# Patient Record
Sex: Male | Born: 1935 | Race: Black or African American | Hispanic: No | Marital: Single | Smoking: Never smoker
Health system: Southern US, Community
[De-identification: ages and names within clinical notes are randomized; demographics above are authoritative.]

## PROBLEM LIST (undated history)

## (undated) DIAGNOSIS — I4892 Unspecified atrial flutter: Secondary | ICD-10-CM

## (undated) DIAGNOSIS — E039 Hypothyroidism, unspecified: Secondary | ICD-10-CM

## (undated) DIAGNOSIS — J90 Pleural effusion, not elsewhere classified: Secondary | ICD-10-CM

## (undated) DIAGNOSIS — H409 Unspecified glaucoma: Secondary | ICD-10-CM

## (undated) DIAGNOSIS — I428 Other cardiomyopathies: Secondary | ICD-10-CM

## (undated) DIAGNOSIS — I10 Essential (primary) hypertension: Secondary | ICD-10-CM

## (undated) DIAGNOSIS — N184 Chronic kidney disease, stage 4 (severe): Secondary | ICD-10-CM

## (undated) DIAGNOSIS — I5020 Unspecified systolic (congestive) heart failure: Secondary | ICD-10-CM

## (undated) DIAGNOSIS — M109 Gout, unspecified: Secondary | ICD-10-CM

## (undated) DIAGNOSIS — K219 Gastro-esophageal reflux disease without esophagitis: Secondary | ICD-10-CM

## (undated) DIAGNOSIS — F329 Major depressive disorder, single episode, unspecified: Secondary | ICD-10-CM

## (undated) DIAGNOSIS — I313 Pericardial effusion (noninflammatory): Secondary | ICD-10-CM

## (undated) DIAGNOSIS — E559 Vitamin D deficiency, unspecified: Secondary | ICD-10-CM

## (undated) DIAGNOSIS — I3139 Other pericardial effusion (noninflammatory): Secondary | ICD-10-CM

## (undated) DIAGNOSIS — D649 Anemia, unspecified: Secondary | ICD-10-CM

## (undated) DIAGNOSIS — F32A Depression, unspecified: Secondary | ICD-10-CM

## (undated) HISTORY — DX: Other cardiomyopathies: I42.8

## (undated) HISTORY — DX: Depression, unspecified: F32.A

## (undated) HISTORY — DX: Gout, unspecified: M10.9

## (undated) HISTORY — DX: Unspecified systolic (congestive) heart failure: I50.20

## (undated) HISTORY — DX: Gastro-esophageal reflux disease without esophagitis: K21.9

## (undated) HISTORY — PX: KNEE SURGERY: SHX244

## (undated) HISTORY — DX: Chronic kidney disease, stage 4 (severe): N18.4

## (undated) HISTORY — DX: Major depressive disorder, single episode, unspecified: F32.9

## (undated) HISTORY — PX: INSERT / REPLACE / REMOVE PACEMAKER: SUR710

## (undated) HISTORY — DX: Pleural effusion, not elsewhere classified: J90

## (undated) HISTORY — DX: Other pericardial effusion (noninflammatory): I31.39

## (undated) HISTORY — DX: Pericardial effusion (noninflammatory): I31.3

---

## 2017-10-31 ENCOUNTER — Ambulatory Visit: Payer: Self-pay

## 2017-11-11 ENCOUNTER — Other Ambulatory Visit: Payer: Self-pay

## 2017-11-11 ENCOUNTER — Ambulatory Visit: Payer: Medicare Other | Admitting: Urology

## 2017-11-11 NOTE — Progress Notes (Deleted)
   11/11/2017 8:33 AM   Alan Marshall 1935-08-31 [id number]  Referring provider: No referring provider defined for this encounter.  No chief complaint on file.   HPI:    PMH: No past medical history on file.  Surgical History: *** The histories are not reviewed yet. Please review them in the "History" navigator section and refresh this [place].  Home Medications:  Allergies as of 11/11/2017   Not on File     Medication List    as of 11/11/2017  8:33 AM   You have not been prescribed any medications.     Allergies: Allergies not on file  Family History: No family history on file.  Social History:  has no tobacco, alcohol, and drug history on file.  ROS:                                        Physical Exam: There were no vitals taken for this visit.  Constitutional:  Alert and oriented, No acute distress. HEENT: [place] AT, moist mucus membranes.  Trachea midline, no masses. Cardiovascular: No clubbing, cyanosis, or edema. Respiratory: Normal respiratory effort, no increased work of breathing. GI: Abdomen is soft, nontender, nondistended, no abdominal masses GU: No CVA tenderness Lymph: No cervical or inguinal lymphadenopathy. Skin: No rashes, bruises or suspicious lesions. Neurologic: Grossly intact, no focal deficits, moving all 4 extremities. Psychiatric: Normal mood and affect.  Laboratory Data: No results found for: WBC, HGB, HCT, MCV, PLT  No results found for: CREATININE  No results found for: PSA  No results found for: TESTOSTERONE  No results found for: HGBA1C  Urinalysis No results found for: COLORURINE, APPEARANCEUR, LABSPEC, PHURINE, GLUCOSEU, HGBUR, BILIRUBINUR, KETONESUR, PROTEINUR, UROBILINOGEN, NITRITE, LEUKOCYTESUR  No results found for: LABMICR, [place], RBCUA, LABEPIT, MUCUS, BACTERIA  Pertinent Imaging: *** No results found for this or any previous visit. No results found for this or any previous visit. No  results found for this or any previous visit. No results found for this or any previous visit. No results found for this or any previous visit. No results found for this or any previous visit. No results found for this or any previous visit. No results found for this or any previous visit.  Assessment & Plan:    There are no diagnoses linked to this encounter.  No follow-ups on file.  Hollice Espy, MD  [facility] [address], [place], [place] [address] [phone]

## 2017-11-13 ENCOUNTER — Other Ambulatory Visit
Admission: RE | Admit: 2017-11-13 | Discharge: 2017-11-13 | Disposition: A | Discharge status: Home / Self Care | Payer: Medicare Other | Source: Ambulatory Visit | Attending: Family Medicine | Admitting: Family Medicine

## 2017-11-13 DIAGNOSIS — R4182 Altered mental status, unspecified: Secondary | ICD-10-CM | POA: Insufficient documentation

## 2017-11-13 LAB — COMPREHENSIVE METABOLIC PANEL
ALT: 10 U/L — ABNORMAL LOW (ref 17–63)
AST: 26 U/L (ref 15–41)
Albumin: 3.3 g/dL — ABNORMAL LOW (ref 3.5–5.0)
Alkaline Phosphatase: 78 U/L (ref 38–126)
Anion gap: 16 — ABNORMAL HIGH (ref 5–15)
BILIRUBIN TOTAL: 1.3 mg/dL — AB (ref 0.3–1.2)
BUN: 73 mg/dL — ABNORMAL HIGH (ref 6–20)
CALCIUM: 8.4 mg/dL — AB (ref 8.9–10.3)
CO2: 17 mmol/L — ABNORMAL LOW (ref 22–32)
CREATININE: 2.78 mg/dL — AB (ref 0.61–1.24)
Chloride: 105 mmol/L (ref 101–111)
GFR calc Af Amer: 23 mL/min — ABNORMAL LOW (ref >60)
GFR calc non Af Amer: 20 mL/min — ABNORMAL LOW (ref >60)
Glucose, Bld: 92 mg/dL (ref 65–99)
Potassium: 4 mmol/L (ref 3.5–5.1)
SODIUM: 138 mmol/L (ref 135–145)
Total Protein: 7.9 g/dL (ref 6.5–8.1)

## 2017-11-15 ENCOUNTER — Inpatient Hospital Stay
Admission: EM | Admit: 2017-11-15 | Discharge: 2017-11-20 | DRG: 291 | Disposition: A | Discharge status: Skilled Nursing Facility | Payer: Medicare Other | Attending: Internal Medicine | Admitting: Internal Medicine

## 2017-11-15 ENCOUNTER — Emergency Department: Payer: Medicare Other

## 2017-11-15 DIAGNOSIS — I248 Other forms of acute ischemic heart disease: Secondary | ICD-10-CM | POA: Diagnosis present

## 2017-11-15 DIAGNOSIS — I130 Hypertensive heart and chronic kidney disease with heart failure and stage 1 through stage 4 chronic kidney disease, or unspecified chronic kidney disease: Secondary | ICD-10-CM | POA: Diagnosis not present

## 2017-11-15 DIAGNOSIS — M25461 Effusion, right knee: Secondary | ICD-10-CM | POA: Diagnosis present

## 2017-11-15 DIAGNOSIS — N5089 Other specified disorders of the male genital organs: Secondary | ICD-10-CM | POA: Diagnosis present

## 2017-11-15 DIAGNOSIS — E039 Hypothyroidism, unspecified: Secondary | ICD-10-CM | POA: Diagnosis present

## 2017-11-15 DIAGNOSIS — I480 Paroxysmal atrial fibrillation: Secondary | ICD-10-CM | POA: Diagnosis present

## 2017-11-15 DIAGNOSIS — R0602 Shortness of breath: Secondary | ICD-10-CM | POA: Diagnosis present

## 2017-11-15 DIAGNOSIS — Z7189 Other specified counseling: Secondary | ICD-10-CM | POA: Diagnosis not present

## 2017-11-15 DIAGNOSIS — I5023 Acute on chronic systolic (congestive) heart failure: Secondary | ICD-10-CM | POA: Diagnosis present

## 2017-11-15 DIAGNOSIS — I313 Pericardial effusion (noninflammatory): Secondary | ICD-10-CM | POA: Diagnosis present

## 2017-11-15 DIAGNOSIS — I4892 Unspecified atrial flutter: Secondary | ICD-10-CM | POA: Diagnosis present

## 2017-11-15 DIAGNOSIS — K4090 Unilateral inguinal hernia, without obstruction or gangrene, not specified as recurrent: Secondary | ICD-10-CM | POA: Diagnosis present

## 2017-11-15 DIAGNOSIS — F0390 Unspecified dementia without behavioral disturbance: Secondary | ICD-10-CM | POA: Diagnosis present

## 2017-11-15 DIAGNOSIS — R7989 Other specified abnormal findings of blood chemistry: Secondary | ICD-10-CM

## 2017-11-15 DIAGNOSIS — I959 Hypotension, unspecified: Secondary | ICD-10-CM | POA: Diagnosis present

## 2017-11-15 DIAGNOSIS — Z515 Encounter for palliative care: Secondary | ICD-10-CM | POA: Diagnosis present

## 2017-11-15 DIAGNOSIS — N184 Chronic kidney disease, stage 4 (severe): Secondary | ICD-10-CM | POA: Diagnosis present

## 2017-11-15 DIAGNOSIS — S40019A Contusion of unspecified shoulder, initial encounter: Secondary | ICD-10-CM | POA: Diagnosis present

## 2017-11-15 DIAGNOSIS — F329 Major depressive disorder, single episode, unspecified: Secondary | ICD-10-CM | POA: Diagnosis present

## 2017-11-15 DIAGNOSIS — R609 Edema, unspecified: Secondary | ICD-10-CM

## 2017-11-15 DIAGNOSIS — E8770 Fluid overload, unspecified: Secondary | ICD-10-CM

## 2017-11-15 DIAGNOSIS — I509 Heart failure, unspecified: Secondary | ICD-10-CM

## 2017-11-15 DIAGNOSIS — E440 Moderate protein-calorie malnutrition: Secondary | ICD-10-CM | POA: Diagnosis present

## 2017-11-15 DIAGNOSIS — Z6825 Body mass index (BMI) 25.0-25.9, adult: Secondary | ICD-10-CM

## 2017-11-15 DIAGNOSIS — R946 Abnormal results of thyroid function studies: Secondary | ICD-10-CM | POA: Diagnosis present

## 2017-11-15 DIAGNOSIS — W19XXXA Unspecified fall, initial encounter: Secondary | ICD-10-CM | POA: Diagnosis present

## 2017-11-15 DIAGNOSIS — Z79899 Other long term (current) drug therapy: Secondary | ICD-10-CM

## 2017-11-15 DIAGNOSIS — I351 Nonrheumatic aortic (valve) insufficiency: Secondary | ICD-10-CM | POA: Diagnosis not present

## 2017-11-15 DIAGNOSIS — E876 Hypokalemia: Secondary | ICD-10-CM | POA: Diagnosis not present

## 2017-11-15 DIAGNOSIS — I340 Nonrheumatic mitral (valve) insufficiency: Secondary | ICD-10-CM | POA: Diagnosis not present

## 2017-11-15 DIAGNOSIS — N179 Acute kidney failure, unspecified: Secondary | ICD-10-CM | POA: Diagnosis present

## 2017-11-15 DIAGNOSIS — D631 Anemia in chronic kidney disease: Secondary | ICD-10-CM | POA: Diagnosis present

## 2017-11-15 DIAGNOSIS — R748 Abnormal levels of other serum enzymes: Secondary | ICD-10-CM | POA: Diagnosis not present

## 2017-11-15 DIAGNOSIS — H409 Unspecified glaucoma: Secondary | ICD-10-CM | POA: Diagnosis present

## 2017-11-15 DIAGNOSIS — Z8619 Personal history of other infectious and parasitic diseases: Secondary | ICD-10-CM

## 2017-11-15 DIAGNOSIS — R778 Other specified abnormalities of plasma proteins: Secondary | ICD-10-CM

## 2017-11-15 DIAGNOSIS — I429 Cardiomyopathy, unspecified: Secondary | ICD-10-CM | POA: Diagnosis present

## 2017-11-15 DIAGNOSIS — E559 Vitamin D deficiency, unspecified: Secondary | ICD-10-CM | POA: Diagnosis present

## 2017-11-15 DIAGNOSIS — N19 Unspecified kidney failure: Secondary | ICD-10-CM

## 2017-11-15 DIAGNOSIS — I483 Typical atrial flutter: Secondary | ICD-10-CM | POA: Diagnosis not present

## 2017-11-15 DIAGNOSIS — I5082 Biventricular heart failure: Secondary | ICD-10-CM | POA: Diagnosis present

## 2017-11-15 DIAGNOSIS — I5043 Acute on chronic combined systolic (congestive) and diastolic (congestive) heart failure: Secondary | ICD-10-CM | POA: Diagnosis not present

## 2017-11-15 DIAGNOSIS — I482 Chronic atrial fibrillation: Secondary | ICD-10-CM | POA: Diagnosis not present

## 2017-11-15 HISTORY — DX: Unspecified atrial flutter: I48.92

## 2017-11-15 HISTORY — DX: Anemia, unspecified: D64.9

## 2017-11-15 HISTORY — DX: Hypothyroidism, unspecified: E03.9

## 2017-11-15 HISTORY — DX: Unspecified glaucoma: H40.9

## 2017-11-15 HISTORY — DX: Vitamin D deficiency, unspecified: E55.9

## 2017-11-15 HISTORY — DX: Essential (primary) hypertension: I10

## 2017-11-15 LAB — COMPREHENSIVE METABOLIC PANEL
ALBUMIN: 3.3 g/dL — AB (ref 3.5–5.0)
ALK PHOS: 78 U/L (ref 38–126)
ALT: 10 U/L — ABNORMAL LOW (ref 17–63)
AST: 26 U/L (ref 15–41)
Anion gap: 14 (ref 5–15)
BUN: 77 mg/dL — AB (ref 6–20)
CALCIUM: 8.9 mg/dL (ref 8.9–10.3)
CO2: 18 mmol/L — ABNORMAL LOW (ref 22–32)
Chloride: 106 mmol/L (ref 101–111)
Creatinine, Ser: 2.67 mg/dL — ABNORMAL HIGH (ref 0.61–1.24)
GFR calc Af Amer: 25 mL/min — ABNORMAL LOW (ref >60)
GFR, EST NON AFRICAN AMERICAN: 21 mL/min — AB (ref >60)
GLUCOSE: 108 mg/dL — AB (ref 65–99)
Potassium: 4.3 mmol/L (ref 3.5–5.1)
Sodium: 138 mmol/L (ref 135–145)
Total Bilirubin: 1.2 mg/dL (ref 0.3–1.2)
Total Protein: 7.6 g/dL (ref 6.5–8.1)

## 2017-11-15 LAB — CBC
HEMATOCRIT: 34.3 % — AB (ref 40.0–52.0)
HEMOGLOBIN: 11 g/dL — AB (ref 13.0–18.0)
MCH: 29.5 pg (ref 26.0–34.0)
MCHC: 32.2 g/dL (ref 32.0–36.0)
MCV: 91.7 fL (ref 80.0–100.0)
Platelets: 167 10*3/uL (ref 150–440)
RBC: 3.75 MIL/uL — ABNORMAL LOW (ref 4.40–5.90)
RDW: 23.2 % — ABNORMAL HIGH (ref 11.5–14.5)
WBC: 6.6 10*3/uL (ref 3.8–10.6)

## 2017-11-15 LAB — PROTIME-INR
INR: 1.54
Prothrombin Time: 18.4 seconds — ABNORMAL HIGH (ref 11.4–15.2)

## 2017-11-15 LAB — LIPASE, BLOOD: LIPASE: 27 U/L (ref 11–51)

## 2017-11-15 LAB — TROPONIN I: TROPONIN I: 0.11 ng/mL — AB (ref <0.03)

## 2017-11-15 LAB — BRAIN NATRIURETIC PEPTIDE: B Natriuretic Peptide: 2886 pg/mL — ABNORMAL HIGH (ref 0.0–100.0)

## 2017-11-15 MED ORDER — ASPIRIN EC 81 MG PO TBEC
81.0000 mg | DELAYED_RELEASE_TABLET | Freq: Every day | ORAL | Status: DC
  Administered 2017-11-16 – 2017-11-20 (×5): 81 mg via ORAL
  Filled 2017-11-15 (×5): qty 1

## 2017-11-15 MED ORDER — SODIUM CHLORIDE 0.9 % IV SOLN
1.0000 g | INTRAVENOUS | Status: DC
  Administered 2017-11-15: 1 g via INTRAVENOUS
  Filled 2017-11-15 (×2): qty 10

## 2017-11-15 MED ORDER — DOXYCYCLINE HYCLATE 100 MG PO TABS
100.0000 mg | ORAL_TABLET | Freq: Two times a day (BID) | ORAL | Status: DC
  Administered 2017-11-15 – 2017-11-20 (×10): 100 mg via ORAL
  Filled 2017-11-15 (×10): qty 1

## 2017-11-15 MED ORDER — LEVOTHYROXINE SODIUM 50 MCG PO TABS
75.0000 ug | ORAL_TABLET | Freq: Every day | ORAL | Status: DC
  Administered 2017-11-17: 75 ug via ORAL
  Filled 2017-11-15: qty 2

## 2017-11-15 MED ORDER — ONDANSETRON HCL 4 MG PO TABS: 4.0000 mg | ORAL_TABLET | Freq: Four times a day (QID) | ORAL | Status: DC | PRN

## 2017-11-15 MED ORDER — CITALOPRAM HYDROBROMIDE 20 MG PO TABS
40.0000 mg | ORAL_TABLET | Freq: Every day | ORAL | Status: DC
  Administered 2017-11-16 – 2017-11-20 (×5): 40 mg via ORAL
  Filled 2017-11-15 (×5): qty 2

## 2017-11-15 MED ORDER — HEPARIN SODIUM (PORCINE) 5000 UNIT/ML IJ SOLN
5000.0000 [IU] | Freq: Three times a day (TID) | INTRAMUSCULAR | Status: DC
Start: 2017-11-15 — End: 2017-11-20
  Administered 2017-11-15 – 2017-11-20 (×13): 5000 [IU] via SUBCUTANEOUS
  Filled 2017-11-15 (×14): qty 1

## 2017-11-15 MED ORDER — ALLOPURINOL 100 MG PO TABS
50.0000 mg | ORAL_TABLET | Freq: Every day | ORAL | Status: DC
  Administered 2017-11-16 – 2017-11-20 (×5): 50 mg via ORAL
  Filled 2017-11-15 (×5): qty 0.5

## 2017-11-15 MED ORDER — PNEUMOCOCCAL VAC POLYVALENT 25 MCG/0.5ML IJ INJ: 0.5000 mL | INJECTION | INTRAMUSCULAR | Status: DC

## 2017-11-15 MED ORDER — FUROSEMIDE 10 MG/ML IJ SOLN
80.0000 mg | Freq: Two times a day (BID) | INTRAMUSCULAR | Status: DC
  Administered 2017-11-16 – 2017-11-18 (×5): 80 mg via INTRAVENOUS
  Filled 2017-11-15 (×5): qty 8

## 2017-11-15 MED ORDER — LATANOPROST 0.005 % OP SOLN
1.0000 [drp] | Freq: Every day | OPHTHALMIC | Status: DC
  Administered 2017-11-15 – 2017-11-19 (×5): 1 [drp] via OPHTHALMIC
  Filled 2017-11-15: qty 2.5

## 2017-11-15 MED ORDER — ONDANSETRON HCL 4 MG/2ML IJ SOLN: 4.0000 mg | Freq: Four times a day (QID) | INTRAMUSCULAR | Status: DC | PRN

## 2017-11-15 MED ORDER — AMIODARONE HCL 100 MG PO TABS
100.0000 mg | ORAL_TABLET | Freq: Every day | ORAL | Status: DC
  Administered 2017-11-16: 100 mg via ORAL
  Filled 2017-11-15 (×2): qty 1

## 2017-11-15 MED ORDER — ALBUTEROL SULFATE (2.5 MG/3ML) 0.083% IN NEBU
2.5000 mg | INHALATION_SOLUTION | Freq: Four times a day (QID) | RESPIRATORY_TRACT | Status: DC
  Administered 2017-11-15 – 2017-11-16 (×2): 2.5 mg via RESPIRATORY_TRACT
  Filled 2017-11-15 (×3): qty 3

## 2017-11-15 MED ORDER — ACETAMINOPHEN 650 MG RE SUPP: 650.0000 mg | Freq: Four times a day (QID) | RECTAL | Status: DC | PRN

## 2017-11-15 MED ORDER — POLYETHYLENE GLYCOL 3350 17 G PO PACK
17.0000 g | PACK | Freq: Every day | ORAL | Status: DC
  Administered 2017-11-16 – 2017-11-20 (×5): 17 g via ORAL
  Filled 2017-11-15 (×5): qty 1

## 2017-11-15 MED ORDER — ONDANSETRON HCL 4 MG/2ML IJ SOLN
INTRAMUSCULAR | Status: AC
  Filled 2017-11-15: qty 2

## 2017-11-15 MED ORDER — ACETAMINOPHEN 325 MG PO TABS
650.0000 mg | ORAL_TABLET | Freq: Four times a day (QID) | ORAL | Status: DC | PRN
  Administered 2017-11-16 – 2017-11-17 (×2): 650 mg via ORAL
  Filled 2017-11-15 (×2): qty 2

## 2017-11-15 NOTE — ED Triage Notes (Signed)
Pt presents today from [facility] for abnormal xray and labs. EMS reports that the were told CREA  Is elevated and that pt has a pacemaker. Pt states he wear 02 occasionally. Pt 02 stat is 83 on RA pt placed on 2L now.

## 2017-11-15 NOTE — Progress Notes (Signed)
The patient is admitted to room 238 with the diagnosis of CHF. Patient is alert and oriented x 3 but poor historian. Denied any acute pain at this moment. No respiratory distress noted. Noted massive scrotal edema and bilateral leg wound as noted in assessment flow sheet. Patient is oriented to his room, ascom/call bell. Bed alarm activated and the bed is in the lowest position. Lasix 80 mg held per Dr. Darci Needle order due to low BP=102/73. Patient has been consistently low at ER with SBP running in the upper 90 s. Will continue to monitor.

## 2017-11-15 NOTE — Progress Notes (Signed)
[name], male   DOB: [birth year], 82 y.o.   MRN: [id number]  I did speak with the patient's daughter who lives in [place].  She mentioned that the patient is usually very coherent.  So this is new for the patient.  The patient normally has an issue with fluid and swelling.  So I will give him the IV Lasix for now.  Asked for nephrology consultation in the morning.  Check creatinine again in the morning.  Will get a CT scan of the head.  I will empirically start antibiotics just in case what they are seeing on the chest x-ray actually is a pneumonia.  I will send off a urinalysis and urine culture.  Dr. Loletha Grayer

## 2017-11-15 NOTE — ED Provider Notes (Signed)
[facility] Emergency Department Provider Note   ____________________________________________   First MD Initiated Contact with Patient 11/15/17 1823     (approximate)  I have reviewed the triage vital signs and the nursing notes.   HISTORY  Chief Complaint Abnormal Lab  EM caveat: The patient is a very poor historian, does not seem to have good recollection of all of his medical history.  No family available.  EMS provides additional  HPI Alan Marshall is a 82 y.o. male with an extensive past medical history including volume overload, previous hemodialysis which she reports is no longer on.  Patient reports for the last 3 days they have been increasing his water pills.  He also got a shot of a water pill a day.  Patient reports he had significant swelling in his lower legs, feeling short of breath for about 4 to 5 days now and is "been trying to get here" to be evaluated for about 4 days for shortness of breath.  Reports he cannot lay flat without feeling very short of breath.  No chest pain.  No nausea vomiting.   Past Medical History:  Diagnosis Date  . Anemia   . Atrial flutter ([place])   . CHF (congestive heart failure) ([place])   . Chronic kidney disease   . Glaucoma   . Hypertension   . Hypothyroidism   . Vitamin D deficiency     Patient Active Problem List   Diagnosis Date Noted  . CHF (congestive heart failure) ([place]) 11/15/2017    Past Surgical History:  Procedure Laterality Date  . NO PAST SURGERIES      Prior to Admission medications   Not on File  Reviewed medications listed by facility  Allergies Patient has no allergy information on record.  Family History  Problem Relation Age of Onset  . Hypertension Father     Patient denies drug allergies  Social History Social History   Tobacco Use  . Smoking status: Never Smoker  . Smokeless tobacco: Never Used  Substance Use Topics  . Alcohol use: Not Currently  . Drug use:  Never    Review of Systems Constitutional: No fever/chills ENT: No sore throat. Cardiovascular: Denies chest pain. Respiratory: See HPI.  Denies productive cough. Gastrointestinal: No abdominal pain.   Skin: Negative for rash.  Reports significant swelling from his knees down on both sides. Neurological: Negative for headaches, weakness, or numbness.    ____________________________________________   PHYSICAL EXAM:  VITAL SIGNS: ED Triage Vitals  Enc Vitals Group     BP 11/15/17 1740 96/76     Pulse Rate 11/15/17 1740 78     Resp 11/15/17 1900 16     Temp 11/15/17 1740 98.7 F (37.1 C)     Temp Source 11/15/17 1740 Oral     SpO2 11/15/17 1740 (!) 88 %     Weight 11/15/17 1741 202 lb 2.6 oz (91.7 kg)     Height 11/15/17 1741 6\' 1"  (1.854 m)     Head Circumference --      Peak Flow --      Pain Score 11/15/17 1741 0     Pain Loc --      Pain Edu? --      Excl. in [place]? --     Constitutional: Alert and oriented.  Mildly dyspneic.  Very poor historian.  Does talk about how he is from [place], not sure how long he is been living at the nursing care home in  [place]. Eyes: Conjunctivae are normal. Head: Atraumatic. Nose: No congestion/rhinnorhea. Mouth/Throat: Mucous membranes are moist. Neck: No stridor.  There is mild JVD Cardiovascular: Normal rate, regular rhythm. Grossly normal heart sounds.  Good peripheral circulation. Respiratory: There is mild use of accessory muscles.  Some questionably faint crackles in the bases bilaterally versus diminished lung sounds.  No wheezing.  The upper lobes sound clear.  He speaks in phrases with just slight dyspnea noted.  He cannot tolerate laying back his reports he has a feel short of breath. Gastrointestinal: Soft and nontender. No distention. Musculoskeletal: No lower extremity tenderness but has 2+ pitting edema in the lower extremities bilaterally. Neurologic:  Normal speech and language though his historical recollection  seems very poor.. No gross focal neurologic deficits are appreciated.  He is able to move all extremities with equal strength, wiggle his toes of his legs with report his legs feel too heavy on both sides to really lift them up off the bed. Skin:  Skin is warm, dry and intact. No rash noted. Psychiatric: Mood and affect are calm.  He is very pleasant.  ____________________________________________   LABS (all labs ordered are listed, but only abnormal results are displayed)  Labs Reviewed  TROPONIN I - Abnormal; Notable for the following components:      Result Value   Troponin I 0.11 (*)    All other components within normal limits  CBC - Abnormal; Notable for the following components:   RBC 3.75 (*)    Hemoglobin 11.0 (*)    HCT 34.3 (*)    RDW 23.2 (*)    All other components within normal limits  COMPREHENSIVE METABOLIC PANEL - Abnormal; Notable for the following components:   CO2 18 (*)    Glucose, Bld 108 (*)    BUN 77 (*)    Creatinine, Ser 2.67 (*)    Albumin 3.3 (*)    ALT 10 (*)    GFR calc non Af Amer 21 (*)    GFR calc Af Amer 25 (*)    All other components within normal limits  PROTIME-INR - Abnormal; Notable for the following components:   Prothrombin Time 18.4 (*)    All other components within normal limits  BRAIN NATRIURETIC PEPTIDE - Abnormal; Notable for the following components:   B Natriuretic Peptide 2,886.0 (*)    All other components within normal limits  LIPASE, BLOOD  BASIC METABOLIC PANEL  CBC   ____________________________________________  EKG  Reviewed and entered by me at 1745 Heart rate 80 Cures 120 QTC 500 Atrial fibrillation, left bundle branch block.  No evidence of acute ischemia.  Occasional PVC. ____________________________________________  RADIOLOGY   Chest x-ray reviewed by me.  Notable cardiomegaly.  Question some left-sided atelectasis versus consolidation  Chest x-ray read out his nursing home with vascular congestion  denoted from paperwork ____________________________________________   PROCEDURES  Procedure(s) performed: None  Procedures  Critical Care performed: No  ____________________________________________   INITIAL IMPRESSION / ASSESSMENT AND PLAN / ED COURSE  Pertinent labs & imaging results that were available during my care of the patient were reviewed by me and considered in my medical decision making (see chart for details).  Patient presents for evaluation of dyspnea.  He appears grossly volume overloaded, but also has mild to moderate hypotension.  Will initiate work-up, check chest x-ray BNP etc.  Denies signs or symptoms of pulmonary embolism, no chest pain.  History obtain x-rays, lab work before decision to diuresis.  The patient's mild  hypotension and report of elevated creatinine aches that decision point slightly more difficult to know whether or not the patient is overloaded but potentially intravascularly depleted.  EKG does not demonstrate ischemia but his troponin is minimally elevated likely some mild demand ischemia.  Denies chest pain.  No fevers or chills.  No signs or symptoms suggest pneumonia, I suspect he may have atelectasis and possibly some vascular congestion and he has notable cardiomegaly.  Clinical Course as of Nov 16 2106  Sat Nov 15, 2017  9983 Patient alert, at bedside with him now. Reports his lungs feel weak and concerned swelling in his legs may be in his lungs last 4 days. Slowly worsening last 4 days. He is alert, oriented, but mild use of accessory muscles. Reports he had dialysis at one time, but has been off now for some time.    [MQ]    Clinical Course User Index [MQ] Delman Kitten, MD   ----------------------------------------- 9:07 PM on 11/15/2017 -----------------------------------------  Patient be admitted to the hospitalist service, hospitalist is evaluated and has ordered additional diuresis.  Patient understanding of plan for admission,  is very agreeable with plan for admission.   ____________________________________________   FINAL CLINICAL IMPRESSION(S) / ED DIAGNOSES  Final diagnoses:  Elevated troponin  Shortness of breath  Hypervolemia, unspecified hypervolemia type      NEW MEDICATIONS STARTED DURING THIS VISIT:  New Prescriptions   No medications on file     Note:  This document was prepared using Dragon voice recognition software and may include unintentional dictation errors.     Delman Kitten, MD 11/15/17 2108

## 2017-11-15 NOTE — H&P (Signed)
[place] [facility] [place] NAME: Cyree Chuong    MR#:  [id number]  DATE OF BIRTH:  Sep 10, 1935  DATE OF ADMISSION:  11/15/2017  PRIMARY CARE PHYSICIAN: Loman [place]  REQUESTING/REFERRING PHYSICIAN: Dr Delman Kitten  CHIEF COMPLAINT:   Chief Complaint  Patient presents with  . Abnormal Lab    HISTORY OF PRESENT ILLNESS:  Keefer Soulliere  is a 82 y.o. male with a known history of systolic congestive heart failure presents with shortness of breath and swelling.  The patient is a very poor historian.  He states that he lived in [place] and now is in a rest home here.  Unclear what this is about.  I tried to reach his daughter but left a message.  Patient stated the scrotal swelling started 2 days ago.  PAST MEDICAL HISTORY:   Past Medical History:  Diagnosis Date  . Anemia   . Atrial flutter ([place])   . CHF (congestive heart failure) ([place])   . Chronic kidney disease   . Glaucoma   . Hypertension   . Hypothyroidism   . Vitamin D deficiency     PAST SURGICAL HISTORY:   Past Surgical History:  Procedure Laterality Date  . NO PAST SURGERIES      SOCIAL HISTORY:   Social History   Tobacco Use  . Smoking status: Never Smoker  . Smokeless tobacco: Never Used  Substance Use Topics  . Alcohol use: Not Currently    FAMILY HISTORY:   Family History  Problem Relation Age of Onset  . Hypertension Father     DRUG ALLERGIES:  Allergies not on file  REVIEW OF SYSTEMS:  Limited secondary to poor historian  CONSTITUTIONAL: No fever  EARS, NOSE, AND THROAT:no sore throat RESPIRATORY: Some cough and shortness of breath. CARDIOVASCULAR: No chest pain GASTROINTESTINAL: No abdominal pain. GENITOURINARY: No dysuria  HEMATOLOGY:  history of anemia SKIN: No rash MUSCULOSKELETAL: Some leg pain PSYCHIATRY: On meds for depression  MEDICATIONS AT HOME:   Prior to Admission medications   Not on File   Medication  reconciliation process still undergoing  VITAL SIGNS:  Blood pressure 103/76, pulse 80, temperature 98.7 F (37.1 C), temperature source Oral, resp. rate 16, height 6\' 1"  (1.854 m), weight 91.7 kg (202 lb 2.6 oz), SpO2 95 %.  PHYSICAL EXAMINATION:  GENERAL:  82 y.o.-year-old patient lying in the bed with no acute distress.  EYES: Pupils equal, round, reactive to light and accommodation. No scleral icterus.  HEENT: Head atraumatic, normocephalic. Oropharynx and nasopharynx clear.  NECK:  Supple, no jugular venous distention. No thyroid enlargement, no tenderness.  LUNGS:  decreased breath sounds bilaterally,  positive rales at the bases. No use of accessory muscles of respiration.  CARDIOVASCULAR: S1, S2 normal. No murmurs, rubs, or gallops.  ABDOMEN: Soft, nontender, nondistended. Bowel sounds present. No organomegaly or mass.  EXTREMITIES:  2+ edema, no cyanosis, or clubbing.  NEUROLOGIC: Cranial nerves II through XII are intact. Muscle strength 5/5 in all extremities. Sensation intact. Gait not checked.  PSYCHIATRIC: The patient is alert and oriented x 3.  SKIN: Skin tear left shin no signs of infection.  LABORATORY PANEL:   CBC Recent Labs  Lab 11/15/17 1749  WBC 6.6  HGB 11.0*  HCT 34.3*  PLT 167   ------------------------------------------------------------------------------------------------------------------  Chemistries  Recent Labs  Lab 11/15/17 1749  NA 138  K 4.3  CL 106  CO2 18*  GLUCOSE 108*  BUN 77*  CREATININE 2.67*  CALCIUM 8.9  AST 26  ALT 10*  ALKPHOS 78  BILITOT 1.2   ------------------------------------------------------------------------------------------------------------------  Cardiac Enzymes Recent Labs  Lab 11/15/17 1749  TROPONINI 0.11*   ------------------------------------------------------------------------------------------------------------------  RADIOLOGY:  Dg Chest Port 1 View  Result Date: 11/15/2017 CLINICAL DATA:   Dyspnea EXAM: PORTABLE CHEST 1 VIEW COMPARISON:  None. FINDINGS: The mediastinal contour is normal. The heart size is enlarged. Consolidation of the left mid and lung base are noted. The right lung is clear. No acute abnormalities identified in the bony structures. IMPRESSION: Cardiomegaly. Consolidation left mid and lung base, underlying pneumonia not excluded. Electronically Signed   By: Abelardo Diesel M.D.   On: 11/15/2017 19:20    EKG:   Atrial fibrillation 81 bpm intraventricular conduction delay  IMPRESSION AND PLAN:   1.  Acute on chronic systolic congestive heart failure with anasarca.  Start Lasix 80 mg IV twice daily.  Obtain an echocardiogram. 2.  Relative hypotension limited with any other medications for blood pressure and CHF at this point. 3.  Atrial fibrillation on amiodarone and aspirin 4.  Hypothyroidism unspecified on levothyroxine 6.  Depression on Zoloft.  I will switch this over to Celexa with his fluid weight gain 7.  Chronic kidney disease stage IV.  Monitor with diuresis 8.  Elevated troponin likely demand ischemia 9.  Moderate malnutrition   All the records are reviewed and case discussed with ED provider. Management plans discussed with the patient, and he is in agreement.  I left message for his daughter  CODE STATUS: Full code  TOTAL TIME TAKING CARE OF THIS PATIENT: 50 minutes.    Loletha Grayer M.D on 11/15/2017 at 8:43 PM  Between 7am to 6pm - Pager - [phone]  After 6pm call admission pager [phone]  [facility]  [phone]  [name]; Alvester Morin

## 2017-11-15 NOTE — ED Notes (Signed)
Updated primary RN Marlaine Hind at facility [facility]) on patient status.

## 2017-11-16 ENCOUNTER — Inpatient Hospital Stay: Payer: Medicare Other

## 2017-11-16 ENCOUNTER — Inpatient Hospital Stay (HOSPITAL_COMMUNITY)
Admit: 2017-11-16 | Discharge: 2017-11-16 | Disposition: A | Discharge status: Home / Self Care | Payer: Medicare Other | Attending: Internal Medicine | Admitting: Internal Medicine

## 2017-11-16 DIAGNOSIS — R748 Abnormal levels of other serum enzymes: Secondary | ICD-10-CM

## 2017-11-16 DIAGNOSIS — I482 Chronic atrial fibrillation: Secondary | ICD-10-CM

## 2017-11-16 DIAGNOSIS — I5023 Acute on chronic systolic (congestive) heart failure: Secondary | ICD-10-CM

## 2017-11-16 DIAGNOSIS — I351 Nonrheumatic aortic (valve) insufficiency: Secondary | ICD-10-CM

## 2017-11-16 DIAGNOSIS — I340 Nonrheumatic mitral (valve) insufficiency: Secondary | ICD-10-CM

## 2017-11-16 DIAGNOSIS — N184 Chronic kidney disease, stage 4 (severe): Secondary | ICD-10-CM

## 2017-11-16 DIAGNOSIS — I4892 Unspecified atrial flutter: Secondary | ICD-10-CM

## 2017-11-16 LAB — URINALYSIS, COMPLETE (UACMP) WITH MICROSCOPIC
BACTERIA UA: NONE SEEN
BILIRUBIN URINE: NEGATIVE
GLUCOSE, UA: NEGATIVE mg/dL
Hgb urine dipstick: NEGATIVE
KETONES UR: NEGATIVE mg/dL
Nitrite: NEGATIVE
PROTEIN: NEGATIVE mg/dL
Specific Gravity, Urine: 1.015 (ref 1.005–1.030)
pH: 5 (ref 5.0–8.0)

## 2017-11-16 LAB — CBC
HEMATOCRIT: 32.3 % — AB (ref 40.0–52.0)
HEMOGLOBIN: 10.5 g/dL — AB (ref 13.0–18.0)
MCH: 29.7 pg (ref 26.0–34.0)
MCHC: 32.4 g/dL (ref 32.0–36.0)
MCV: 91.6 fL (ref 80.0–100.0)
Platelets: 149 10*3/uL — ABNORMAL LOW (ref 150–440)
RBC: 3.52 MIL/uL — ABNORMAL LOW (ref 4.40–5.90)
RDW: 24.2 % — ABNORMAL HIGH (ref 11.5–14.5)
WBC: 6 10*3/uL (ref 3.8–10.6)

## 2017-11-16 LAB — ECHOCARDIOGRAM COMPLETE
Height: 74 in
Weight: 3120 oz

## 2017-11-16 LAB — BASIC METABOLIC PANEL
ANION GAP: 14 (ref 5–15)
BUN: 74 mg/dL — ABNORMAL HIGH (ref 6–20)
CO2: 19 mmol/L — ABNORMAL LOW (ref 22–32)
Calcium: 8.5 mg/dL — ABNORMAL LOW (ref 8.9–10.3)
Chloride: 108 mmol/L (ref 101–111)
Creatinine, Ser: 2.6 mg/dL — ABNORMAL HIGH (ref 0.61–1.24)
GFR calc Af Amer: 25 mL/min — ABNORMAL LOW (ref >60)
GFR calc non Af Amer: 22 mL/min — ABNORMAL LOW (ref >60)
GLUCOSE: 77 mg/dL (ref 65–99)
POTASSIUM: 3.5 mmol/L (ref 3.5–5.1)
Sodium: 141 mmol/L (ref 135–145)

## 2017-11-16 LAB — MRSA PCR SCREENING: MRSA by PCR: NEGATIVE

## 2017-11-16 MED ORDER — ALBUTEROL SULFATE (2.5 MG/3ML) 0.083% IN NEBU: 2.5000 mg | INHALATION_SOLUTION | Freq: Four times a day (QID) | RESPIRATORY_TRACT | Status: DC | PRN

## 2017-11-16 NOTE — Progress Notes (Signed)
Patient off unit for testing. Will resume care upon return. Alan Marshall  

## 2017-11-16 NOTE — Plan of Care (Signed)
  Problem: Clinical Measurements: Goal: Ability to maintain clinical measurements within normal limits will improve Outcome: Not Progressing Note:  B.U.N. level is elevated at 74 today. Will continue to monitor renal function. Wenda Low [facility]

## 2017-11-16 NOTE — Consult Note (Signed)
[facility]  CONSULT NOTE    Date: 11/16/2017                  Patient Name:  Alan Marshall  MRN: [id number]  DOB: [birth year]  Age / Sex: 82 y.o., male         PCP: Patient, No Pcp Per                 Service Requesting Consult: Dr. Leslye Peer                 Reason for Consult: Acute renal failure on chronic kidney disease stage III            History of Present Illness: Mr. Alan Marshall is a 82 y.o. black male with systolic congestive heart failure, atrial fibrillation, hypertension, glaucoma, hypothyroidism, depression, who was admitted to [facility] on 11/15/2017 for Shortness of breath [R06.02] Elevated troponin [R74.8] Hypervolemia, unspecified hypervolemia type [E87.70]  Found to be in acute exacerbation of systolic congestive heart failure with ejection fraction of 20-25% on echocardiogram done earlier today. Placed on IV furosemide 80mg  IV q12 with good response.   Patient with confusion after fall. He has a scapular hematoma. However patient answering questions appropriately but slow to respond.    Medications: Outpatient medications: No medications prior to admission.    Current medications: Current Facility-Administered Medications  Medication Dose Route Frequency Provider Last Rate Last Dose  . acetaminophen (TYLENOL) tablet 650 mg  650 mg Oral Q6H PRN Loletha Grayer, MD       Or  . acetaminophen (TYLENOL) suppository 650 mg  650 mg Rectal Q6H PRN Wieting, Richard, MD      . albuterol (PROVENTIL) (2.5 MG/3ML) 0.083% nebulizer solution 2.5 mg  2.5 mg Nebulization Q6H Wieting, Richard, MD   2.5 mg at 11/15/17 2300  . allopurinol (ZYLOPRIM) tablet 50 mg  50 mg Oral Daily Loletha Grayer, MD   50 mg at 11/16/17 0932  . amiodarone (PACERONE) tablet 100 mg  100 mg Oral Daily Loletha Grayer, MD   100 mg at 11/16/17 0932  . aspirin EC tablet 81 mg  81 mg Oral Daily Loletha Grayer, MD   81 mg at 11/16/17 0932  . citalopram (CELEXA) tablet 40 mg  40 mg  Oral Daily Loletha Grayer, MD   40 mg at 11/16/17 0932  . doxycycline (VIBRA-TABS) tablet 100 mg  100 mg Oral Q12H Wieting, Richard, MD   100 mg at 11/16/17 0933  . furosemide (LASIX) injection 80 mg  80 mg Intravenous BID Loletha Grayer, MD   80 mg at 11/16/17 1026  . heparin injection 5,000 Units  5,000 Units Subcutaneous Q8H Loletha Grayer, MD   5,000 Units at 11/16/17 0510  . latanoprost (XALATAN) 0.005 % ophthalmic solution 1 drop  1 drop Both Eyes QHS Loletha Grayer, MD   1 drop at 11/15/17 2250  . levothyroxine (SYNTHROID, LEVOTHROID) tablet 75 mcg  75 mcg Oral QAC breakfast Wieting, Richard, MD      . ondansetron [facility]) tablet 4 mg  4 mg Oral Q6H PRN Wieting, Richard, MD       Or  . ondansetron (ZOFRAN) injection 4 mg  4 mg Intravenous Q6H PRN Wieting, Richard, MD      . pneumococcal 23 valent vaccine (PNU-IMMUNE) injection 0.5 mL  0.5 mL Intramuscular Tomorrow-1000 Wieting, Richard, MD      . polyethylene glycol (MIRALAX / GLYCOLAX) packet 17 g  17 g Oral Daily Loletha Grayer, MD  17 g at 11/16/17 0932      Allergies: Not on File    Past Medical History: Past Medical History:  Diagnosis Date  . Anemia   . Atrial flutter ([place])   . CHF (congestive heart failure) ([place])   . Chronic kidney disease   . Glaucoma   . Hypertension   . Hypothyroidism   . Vitamin D deficiency      Past Surgical History: Past Surgical History:  Procedure Laterality Date  . NO PAST SURGERIES       Family History: Family History  Problem Relation Age of Onset  . Hypertension Father      Social History: Social History   Socioeconomic History  . Marital status: Unknown    Spouse name: Not on file  . Number of children: Not on file  . Years of education: Not on file  . Highest education level: Not on file  Occupational History  . Not on file  Social Needs  . Financial resource strain: Not on file  . Food insecurity:    Worry: Not on file    Inability: Not on file  .  Transportation needs:    Medical: Not on file    Non-medical: Not on file  Tobacco Use  . Smoking status: Never Smoker  . Smokeless tobacco: Never Used  Substance and Sexual Activity  . Alcohol use: Not Currently  . Drug use: Never  . Sexual activity: Not on file  Lifestyle  . Physical activity:    Days per week: Not on file    Minutes per session: Not on file  . Stress: Not on file  Relationships  . Social connections:    Talks on phone: Not on file    Gets together: Not on file    Attends religious service: Not on file    Active member of club or organization: Not on file    Attends meetings of clubs or organizations: Not on file    Relationship status: Not on file  . Intimate partner violence:    Fear of current or ex partner: Not on file    Emotionally abused: Not on file    Physically abused: Not on file    Forced sexual activity: Not on file  Other Topics Concern  . Not on file  Social History Narrative  . Not on file     Review of Systems: Review of Systems  Constitutional: Negative.  Negative for chills, diaphoresis, fever, malaise/fatigue and weight loss.  HENT: Negative.  Negative for congestion, ear discharge, ear pain, hearing loss, nosebleeds, sinus pain, sore throat and tinnitus.   Eyes: Negative.  Negative for blurred vision, double vision, photophobia, pain, discharge and redness.  Respiratory: Positive for shortness of breath. Negative for cough, hemoptysis, sputum production, wheezing and stridor.   Cardiovascular: Positive for leg swelling. Negative for chest pain, palpitations, orthopnea, claudication and PND.  Gastrointestinal: Negative.  Negative for abdominal pain, blood in stool, constipation, diarrhea, heartburn, melena, nausea and vomiting.  Genitourinary: Negative.  Negative for dysuria, flank pain, frequency, hematuria and urgency.  Musculoskeletal: Negative.  Negative for back pain, falls, joint pain, myalgias and neck pain.  Skin: Negative.   Negative for itching and rash.  Neurological: Negative.  Negative for dizziness, tingling, tremors, sensory change, speech change, focal weakness, seizures, loss of consciousness, weakness and headaches.  Endo/Heme/Allergies: Negative.  Negative for environmental allergies and polydipsia. Does not bruise/bleed easily.  Psychiatric/Behavioral: Negative.  Negative for depression, hallucinations, memory loss, substance abuse  and suicidal ideas. The patient is not nervous/anxious and does not have insomnia.     Vital Signs: Blood pressure 102/70, pulse 73, temperature 97.8 F (36.6 C), resp. rate 20, height 6\' 2"  (1.88 m), weight 88.5 kg (195 lb), SpO2 91 %.  Weight trends: Filed Weights   11/15/17 1741 11/15/17 2138 11/16/17 0456  Weight: 91.7 kg (202 lb 2.6 oz) 88.8 kg (195 lb 11.2 oz) 88.5 kg (195 lb)    Physical Exam: General: NAD,   Head: Normocephalic, atraumatic. Moist oral mucosal membranes  Eyes: Anicteric, PERRL  Neck: Supple, trachea midline  Lungs:  Clear to auscultation  Heart: irregular  Abdomen:  Soft, nontender,   Extremities:  trace  peripheral edema.  Neurologic: Alert to self and place. Poor insight  Skin: No lesions         Lab results: Basic Metabolic Panel: Recent Labs  Lab 11/13/17 1215 11/15/17 1749 11/16/17 0421  NA 138 138 141  K 4.0 4.3 3.5  CL 105 106 108  CO2 17* 18* 19*  GLUCOSE 92 108* 77  BUN 73* 77* 74*  CREATININE 2.78* 2.67* 2.60*  CALCIUM 8.4* 8.9 8.5*    Liver Function Tests: Recent Labs  Lab 11/13/17 1215 11/15/17 1749  AST 26 26  ALT 10* 10*  ALKPHOS 78 78  BILITOT 1.3* 1.2  PROT 7.9 7.6  ALBUMIN 3.3* 3.3*   Recent Labs  Lab 11/15/17 1749  LIPASE 27   No results for input(s): AMMONIA in the last 168 hours.  CBC: Recent Labs  Lab 11/15/17 1749 11/16/17 0421  WBC 6.6 6.0  HGB 11.0* 10.5*  HCT 34.3* 32.3*  MCV 91.7 91.6  PLT 167 149*    Cardiac Enzymes: Recent Labs  Lab 11/15/17 1749  TROPONINI 0.11*     BNP: Invalid input(s): POCBNP  CBG: No results for input(s): GLUCAP in the last 168 hours.  Microbiology: Results for orders placed or performed during the hospital encounter of 11/15/17  MRSA PCR Screening     Status: None   Collection Time: 11/15/17 10:03 PM  Result Value Ref Range Status   MRSA by PCR NEGATIVE NEGATIVE Final    Comment:        The GeneXpert MRSA Assay (FDA approved for NASAL specimens only), is one component of a comprehensive MRSA colonization surveillance program. It is not intended to diagnose MRSA infection nor to guide or monitor treatment for MRSA infections. Performed at [facility], [address] [place], [place] [address]     Coagulation Studies: Recent Labs    11/15/17 1749  LABPROT 18.4*  INR 1.54    Urinalysis: Recent Labs    11/15/17 2336  COLORURINE YELLOW*  LABSPEC 1.015  PHURINE 5.0  GLUCOSEU NEGATIVE  HGBUR NEGATIVE  BILIRUBINUR NEGATIVE  KETONESUR NEGATIVE  PROTEINUR NEGATIVE  NITRITE NEGATIVE  LEUKOCYTESUR TRACE*      Imaging: Ct Head Wo Contrast  Result Date: 11/16/2017 CLINICAL DATA:  82 year old male with altered mental status, confusion. EXAM: CT HEAD WITHOUT CONTRAST TECHNIQUE: Contiguous axial images were obtained from the base of the skull through the vertex without intravenous contrast. COMPARISON:  None. FINDINGS: Brain: Cavum septum pellucidum, normal variant. Patchy hypodensity in the basal ganglia, most apparent in the right caudate compatible with a chronic lacunar infarct at that site. Small chronic appearing infarct in the left cerebellum (series 3, image 12). No midline shift, ventriculomegaly, mass effect, evidence of mass lesion, intracranial hemorrhage or evidence of cortically based acute infarction. No cerebral  cortical encephalomalacia identified. Vascular: Calcified atherosclerosis at the skull base. Skull: Intact, negative. Sinuses/Orbits: Mild mucosal thickening in the left sphenoid  sinus. Otherwise clear. Bilateral tympanic cavities and mastoids are clear. Other: Orbits soft tissues appear within normal limits; there is symmetric prominence of the bilateral superior ophthalmic veins. Questionable left posterior convexity scalp skin thickening or mild soft tissue injury (series 4, image 33). This resembles a small scalp hematoma on coronal image 55. IMPRESSION: 1. No definite acute intracranial abnormality. 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 2. Mild posterior left vertex scalp injury/hematoma suspected without underlying skull fracture. Consider recent trauma. Electronically Signed   By: Genevie Ann M.D.   On: 11/16/2017 08:02   Dg Chest Port 1 View  Result Date: 11/15/2017 CLINICAL DATA:  Dyspnea EXAM: PORTABLE CHEST 1 VIEW COMPARISON:  None. FINDINGS: The mediastinal contour is normal. The heart size is enlarged. Consolidation of the left mid and lung base are noted. The right lung is clear. No acute abnormalities identified in the bony structures. IMPRESSION: Cardiomegaly. Consolidation left mid and lung base, underlying pneumonia not excluded. Electronically Signed   By: Abelardo Diesel M.D.   On: 11/15/2017 19:20      Assessment & Plan: Mr. Caulin Begley is a 82 y.o. black male with systolic congestive heart failure, atrial fibrillation, hypertension, glaucoma, hypothyroidism, depression, who was admitted to [facility] on 11/15/2017 for acute exacerbation of systolic congestive heart failure.   1. Acute renal failure on chronic kidney disease stage III: baseline creatinine of 2.2, GFR of 36 from August 2015. No labs since then.  Urinalysis bland Chronic kidney disease most likely secondary to hypertension Acute injury from acute cardiorenal syndrome.  - Home regimen without an ACE-I/ARB - Check renal ultrasound  2. Hypertension with acute exacerbation of systolic congestive heart failure. Echo with EF of 20-25%. Atrial fibrillation on  examination Home regimen seems to be unclear.  - Continue IV furosemide 80mg  IV q12.  3. Anemia with chronic kidney disease: hemoglobin 10.5. Normocytic   LOS: 1 Anelly Samarin 6/9/201911:44 AM
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
## 2017-11-16 NOTE — Progress Notes (Signed)
Patient indicated he brought his brown wallet to the hospital. Wallet not at bedside at this momemt. Towanda Malkin RN at ER to verify if the wallet was left at ER. The RN indicated she did not see any wallet. Patient was notified of the findings and he was okay with it.

## 2017-11-16 NOTE — Care Management (Signed)
Notified by CSW that patient is LTC resident at [name].  Patient is affiliated with [name]. RNCM contacted patient's daughter Alan Marshall HPOA.  Per daughter patient is normally alert and coherent, at this time he is not.  Verbal consent was obtained by daughter to transfer to [facility].  Form was completed by Dr Posey Pronto.  Form and demographics, H&P faxed to [place] office at [phone].

## 2017-11-16 NOTE — Consult Note (Addendum)
Cardiology Consultation:   [name]; [id number]; November 16, 1935   Admit date: 11/15/2017 Date of Consult: 11/16/2017  Primary Care Provider: [place] Primary Cardiologist: No primary care provider on file. [facility] Primary Electrophysiologist:  [facility]?   Patient Profile:   Alan Marshall is a 82 y.o. male with a hx of chronic systolic heart failure who is being seen today for the evaluation of acute on chronic systolic heart failure at the request of Dr. Posey Pronto.  History of Present Illness:   Alan Marshall is a 82 year old man with extensive cardiac history.  He is unable to give much history, due to being very sleepy.  Review of care everywhere shows that in 2015, he had open sternotomy at [facility] for removal of a defibrillator and lead extraction.  This was due to device infection.  It is noted at that time that his ejection fraction was 15% and his creatinine was in the 2.0 range.  He also has a history of atrial fibrillation and at that time was on amiodarone.  He has had prior cardioversion.  It is noted that his cardiomyopathy is nonischemic.  He has been diuresed and his Cr has been decreasing slightly.   Past Medical History:  Diagnosis Date  . Anemia   . Atrial flutter ([place])   . CHF (congestive heart failure) ([place])   . Chronic kidney disease   . Glaucoma   . Hypertension   . Hypothyroidism   . Vitamin D deficiency     Past Surgical History:  Procedure Laterality Date  . NO PAST SURGERIES         Inpatient Medications: Scheduled Meds: . albuterol  2.5 mg Nebulization Q6H  . allopurinol  50 mg Oral Daily  . amiodarone  100 mg Oral Daily  . aspirin EC  81 mg Oral Daily  . citalopram  40 mg Oral Daily  . doxycycline  100 mg Oral Q12H  . furosemide  80 mg Intravenous BID  . heparin  5,000 Units Subcutaneous Q8H  . latanoprost  1 drop Both Eyes QHS  . levothyroxine  75 mcg Oral QAC breakfast  . pneumococcal 23 valent vaccine  0.5 mL Intramuscular  Tomorrow-1000  . polyethylene glycol  17 g Oral Daily   Continuous Infusions:  PRN Meds: acetaminophen **OR** acetaminophen, ondansetron **OR** ondansetron (ZOFRAN) IV  Allergies:   Not on File  Social History:   Social History   Socioeconomic History  . Marital status: Unknown    Spouse name: Not on file  . Number of children: Not on file  . Years of education: Not on file  . Highest education level: Not on file  Occupational History  . Not on file  Social Needs  . Financial resource strain: Not on file  . Food insecurity:    Worry: Not on file    Inability: Not on file  . Transportation needs:    Medical: Not on file    Non-medical: Not on file  Tobacco Use  . Smoking status: Never Smoker  . Smokeless tobacco: Never Used  Substance and Sexual Activity  . Alcohol use: Not Currently  . Drug use: Never  . Sexual activity: Not on file  Lifestyle  . Physical activity:    Days per week: Not on file    Minutes per session: Not on file  . Stress: Not on file  Relationships  . Social connections:    Talks on phone: Not on file    Gets together: Not  on file    Attends religious service: Not on file    Active member of club or organization: Not on file    Attends meetings of clubs or organizations: Not on file    Relationship status: Not on file  . Intimate partner violence:    Fear of current or ex partner: Not on file    Emotionally abused: Not on file    Physically abused: Not on file    Forced sexual activity: Not on file  Other Topics Concern  . Not on file  Social History Narrative  . Not on file    Family History:    Family History  Problem Relation Age of Onset  . Hypertension Father      ROS:  Please see the history of present illness. Unable to obtain as he is not able to answer questions at this time.  All other ROS reviewed and negative.     Physical Exam/Data:   Vitals:   11/15/17 2300 11/16/17 0456 11/16/17 0647 11/16/17 0829  BP:  (!)  89/71 95/75 102/70  Pulse:  71 75 73  Resp:  17  20  Temp:  97.6 F (36.4 C)  97.8 F (36.6 C)  TempSrc:  Oral    SpO2: 93% 100% 92% 91%  Weight:  195 lb (88.5 kg)    Height:        Intake/Output Summary (Last 24 hours) at 11/16/2017 1520 Last data filed at 11/16/2017 1332 Gross per 24 hour  Intake 100 ml  Output 800 ml  Net -700 ml   Filed Weights   11/15/17 1741 11/15/17 2138 11/16/17 0456  Weight: 202 lb 2.6 oz (91.7 kg) 195 lb 11.2 oz (88.8 kg) 195 lb (88.5 kg)   Body mass index is 25.04 kg/m.  General:  Well nourished frail, in no acute distress, sleepy HEENT: normal Lymph: no adenopathy Neck: elevated JVD Endocrine:  No thryomegaly Vascular: No carotid bruits; FA pulses 2+ bilaterally without bruits  Cardiac:  normal S1, S2; RRR; no murmur  Lungs:  no wheezing, rhonchi or rales  Abd: soft, nontender, no hepatomegaly  Ext: tr ankle edema Musculoskeletal:  No deformities, BUE and BLE strength normal and equal Skin: warm and dry  Neuro:  Grossly normal Psych:  Unable to evaluate  EKG:  The EKG was personally reviewed and demonstrates:  Slow atrial flutter with controlled ventricular response; occasional PVC Telemetry:  Telemetry was personally reviewed and demonstrates: Slow atrial flutter with controlled ventricular response  Relevant CV Studies: Ejection fraction 20 to 25%  Laboratory Data:  Chemistry Recent Labs  Lab 11/13/17 1215 11/15/17 1749 11/16/17 0421  NA 138 138 141  K 4.0 4.3 3.5  CL 105 106 108  CO2 17* 18* 19*  GLUCOSE 92 108* 77  BUN 73* 77* 74*  CREATININE 2.78* 2.67* 2.60*  CALCIUM 8.4* 8.9 8.5*  GFRNONAA 20* 21* 22*  GFRAA 23* 25* 25*  ANIONGAP 16* 14 14    Recent Labs  Lab 11/13/17 1215 11/15/17 1749  PROT 7.9 7.6  ALBUMIN 3.3* 3.3*  AST 26 26  ALT 10* 10*  ALKPHOS 78 78  BILITOT 1.3* 1.2   Hematology Recent Labs  Lab 11/15/17 1749 11/16/17 0421  WBC 6.6 6.0  RBC 3.75* 3.52*  HGB 11.0* 10.5*  HCT 34.3* 32.3*  MCV  91.7 91.6  MCH 29.5 29.7  MCHC 32.2 32.4  RDW 23.2* 24.2*  PLT 167 149*   Cardiac Enzymes Recent Labs  Lab 11/15/17 1749  TROPONINI 0.11*   No results for input(s): TROPIPOC in the last 168 hours.  BNP Recent Labs  Lab 11/15/17 1749  BNP 2,886.0*    DDimer No results for input(s): DDIMER in the last 168 hours.  Radiology/Studies:  Ct Head Wo Contrast  Result Date: 11/16/2017 CLINICAL DATA:  82 year old male with altered mental status, confusion. EXAM: CT HEAD WITHOUT CONTRAST TECHNIQUE: Contiguous axial images were obtained from the base of the skull through the vertex without intravenous contrast. COMPARISON:  None. FINDINGS: Brain: Cavum septum pellucidum, normal variant. Patchy hypodensity in the basal ganglia, most apparent in the right caudate compatible with a chronic lacunar infarct at that site. Small chronic appearing infarct in the left cerebellum (series 3, image 12). No midline shift, ventriculomegaly, mass effect, evidence of mass lesion, intracranial hemorrhage or evidence of cortically based acute infarction. No cerebral cortical encephalomalacia identified. Vascular: Calcified atherosclerosis at the skull base. Skull: Intact, negative. Sinuses/Orbits: Mild mucosal thickening in the left sphenoid sinus. Otherwise clear. Bilateral tympanic cavities and mastoids are clear. Other: Orbits soft tissues appear within normal limits; there is symmetric prominence of the bilateral superior ophthalmic veins. Questionable left posterior convexity scalp skin thickening or mild soft tissue injury (series 4, image 33). This resembles a small scalp hematoma on coronal image 55. IMPRESSION: 1. No definite acute intracranial abnormality. 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 2. Mild posterior left vertex scalp injury/hematoma suspected without underlying skull fracture. Consider recent trauma. Electronically Signed   By: Genevie Ann M.D.   On: 11/16/2017 08:02    Dg Chest Port 1 View  Result Date: 11/15/2017 CLINICAL DATA:  Dyspnea EXAM: PORTABLE CHEST 1 VIEW COMPARISON:  None. FINDINGS: The mediastinal contour is normal. The heart size is enlarged. Consolidation of the left mid and lung base are noted. The right lung is clear. No acute abnormalities identified in the bony structures. IMPRESSION: Cardiomegaly. Consolidation left mid and lung base, underlying pneumonia not excluded. Electronically Signed   By: Abelardo Diesel M.D.   On: 11/15/2017 19:20    Assessment and Plan:   1. Acute on chronic systolic heart failure: Continue with diuresis.  Evidence of volume overload given the JVD noted on exam.   2. CRI- stage IV: Watch Cr during diuresis.  Decreasing at this time.  3. Elevated troponin: leikely related to CHF.  Doubt ACS. Regardless, due to his frailty, he is not a candidate for any invasive cardiac testing at this time.  4. H/o AFib.  Appears to be in slow atrial flutter.  Not a good anticoagulation candidate due to falls risk.  5. Agree with palliative care consult.    For questions or updates, please contact [place] Please consult [email or web address] for contact info under Cardiology/STEMI.   SignedLarae Grooms, MD  11/16/2017 3:20 PM

## 2017-11-16 NOTE — Clinical Social Work Note (Signed)
CSW was able to speak to the patient's daughter. She shared that the patient is not at baseline with orientation at this time. She also shared that she is in agreement with transfer to the [facility] should they have bed availability and eventual return to [facility] when he is stable, whether from [facility] or [facility]. CSW is following.  Santiago Bumpers, MSW, Latanya Presser [phone]

## 2017-11-16 NOTE — Clinical Social Work Note (Signed)
CSW received a consult that the patient admitted from [facility]. CSW will assess when able.  Santiago Bumpers, MSW, Latanya Presser [phone]

## 2017-11-16 NOTE — NC FL2 (Signed)
[place] LEVEL OF CARE SCREENING TOOL     IDENTIFICATION  Patient Name: Alan Marshall Birthdate: 08-Sep-1935 Sex: male Admission Date (Current Location): 11/15/2017  [place] and [place] Number:  Alan Marshall [id number] [place] and Address:  [facility], [address], [place], [place] [address]      Provider Number: [id number]  Attending Physician Name and Address:  Fritzi Mandes, MD  Relative Name and Phone Number:  Alan Marshall ([phone]) Daughter/HCPOA    Current Level of Care: Hospital Recommended Level of Care: [place] Prior Approval Number:    Date Approved/Denied: 05/15/16 PASRR Number: [id number]  Discharge Plan: SNF    Current Diagnoses: Patient Active Problem List   Diagnosis Date Noted  . CHF (congestive heart failure) ([place]) 11/15/2017    Orientation RESPIRATION BLADDER Height & Weight     Self, Situation, Place  O2 Continent Weight: 195 lb (88.5 kg) Height:  6\' 2"  (188 cm)  BEHAVIORAL SYMPTOMS/MOOD NEUROLOGICAL BOWEL NUTRITION STATUS      Continent Diet(Heart Healthy)  AMBULATORY STATUS COMMUNICATION OF NEEDS Skin   Extensive Assist Verbally Normal                       Personal Care Assistance Level of Assistance  Bathing, Feeding, Dressing Bathing Assistance: Limited assistance Feeding assistance: Independent Dressing Assistance: Limited assistance     Functional Limitations Info  Sight, Hearing, Speech Sight Info: Adequate Hearing Info: Adequate Speech Info: Adequate    SPECIAL CARE FACTORS FREQUENCY                       Contractures Contractures Info: Not present    Additional Factors Info  Code Status, Allergies, Psychotropic Code Status Info: Full Allergies Info: No Known Allergies Psychotropic Info: Celexa         Current Medications (11/16/2017):  This is the current hospital active medication list Current Facility-Administered Medications  Medication  Dose Route Frequency Provider Last Rate Last Dose  . acetaminophen (TYLENOL) tablet 650 mg  650 mg Oral Q6H PRN Loletha Grayer, MD       Or  . acetaminophen (TYLENOL) suppository 650 mg  650 mg Rectal Q6H PRN Wieting, Richard, MD      . albuterol (PROVENTIL) (2.5 MG/3ML) 0.083% nebulizer solution 2.5 mg  2.5 mg Nebulization Q6H Wieting, Richard, MD   2.5 mg at 11/15/17 2300  . allopurinol (ZYLOPRIM) tablet 50 mg  50 mg Oral Daily Loletha Grayer, MD   50 mg at 11/16/17 0932  . amiodarone (PACERONE) tablet 100 mg  100 mg Oral Daily Loletha Grayer, MD   100 mg at 11/16/17 0932  . aspirin EC tablet 81 mg  81 mg Oral Daily Loletha Grayer, MD   81 mg at 11/16/17 0932  . citalopram (CELEXA) tablet 40 mg  40 mg Oral Daily Loletha Grayer, MD   40 mg at 11/16/17 0932  . doxycycline (VIBRA-TABS) tablet 100 mg  100 mg Oral Q12H Wieting, Richard, MD   100 mg at 11/16/17 0933  . furosemide (LASIX) injection 80 mg  80 mg Intravenous BID Loletha Grayer, MD   80 mg at 11/16/17 1026  . heparin injection 5,000 Units  5,000 Units Subcutaneous Q8H Loletha Grayer, MD   5,000 Units at 11/16/17 0510  . latanoprost (XALATAN) 0.005 % ophthalmic solution 1 drop  1 drop Both Eyes QHS Loletha Grayer, MD   1 drop at 11/15/17 2250  . levothyroxine (SYNTHROID, LEVOTHROID) tablet 75  mcg  75 mcg Oral QAC breakfast Loletha Grayer, MD      . ondansetron [facility]) tablet 4 mg  4 mg Oral Q6H PRN Loletha Grayer, MD       Or  . ondansetron (ZOFRAN) injection 4 mg  4 mg Intravenous Q6H PRN Wieting, Richard, MD      . pneumococcal 23 valent vaccine (PNU-IMMUNE) injection 0.5 mL  0.5 mL Intramuscular Tomorrow-1000 Wieting, Richard, MD      . polyethylene glycol (MIRALAX / GLYCOLAX) packet 17 g  17 g Oral Daily Loletha Grayer, MD   17 g at 11/16/17 0932     Discharge Medications: Please see discharge summary for a list of discharge medications.  Relevant Imaging Results:  Relevant Lab Results:   Additional  Information SS# [id number]  Alan Pho, LCSW

## 2017-11-16 NOTE — Progress Notes (Signed)
[place] [facility] [place] NAME: Alan Marshall    MR#:  [id number]  DATE OF BIRTH:  August 04, 1935  SUBJECTIVE:   Patient is quite confused. Not able to answer most of the questions. He knows he's from somewhere 6 miles from here wife is in [place] and daughter is in [place]. Tells me he is short of breath. Unable to give any other history review of systems REVIEW OF SYSTEMS:   Review of Systems  Unable to perform ROS: Dementia   Tolerating Diet:yes Tolerating PT: pending  DRUG ALLERGIES:  Not on File  VITALS:  Blood pressure 102/70, pulse 73, temperature 97.8 F (36.6 C), resp. rate 20, height 6\' 2"  (1.88 m), weight 88.5 kg (195 lb), SpO2 91 %.  PHYSICAL EXAMINATION:   Physical Exam  GENERAL:  82 y.o.-year-old patient lying in the bed with no acute distress. Appears chronically ill EYES: Pupils equal, round, reactive to light and accommodation. No scleral icterus. Extraocular muscles intact.  HEENT: Head atraumatic, normocephalic. Oropharynx and nasopharynx clear.  NECK:  Supple, no jugular venous distention. No thyroid enlargement, no tenderness.  LUNGS: decreased breath sounds bilaterally, no wheezing, rales, rhonchi. No use of accessory muscles of respiration.  CARDIOVASCULAR: S1, S2 normal. No murmurs, rubs, or gallops.  ABDOMEN: Soft, nontender, nondistended. Bowel sounds present. No organomegaly or mass. Scrotal swelling significant amount right side more than left EXTREMITIES:+++ edema both lower extremities NEUROLOGIC: unable to assess. Patient moves all extremities well  PSYCHIATRIC:  patient is alert but confused  SKIN: No obvious rash, lesion, or ulcer.   LABORATORY PANEL:  CBC Recent Labs  Lab 11/16/17 0421  WBC 6.0  HGB 10.5*  HCT 32.3*  PLT 149*    Chemistries  Recent Labs  Lab 11/15/17 1749 11/16/17 0421  NA 138 141  K 4.3 3.5  CL 106 108  CO2 18* 19*  GLUCOSE 108* 77  BUN 77* 74*  CREATININE  2.67* 2.60*  CALCIUM 8.9 8.5*  AST 26  --   ALT 10*  --   ALKPHOS 78  --   BILITOT 1.2  --    Cardiac Enzymes Recent Labs  Lab 11/15/17 1749  TROPONINI 0.11*   RADIOLOGY:  Ct Head Wo Contrast  Result Date: 11/16/2017 CLINICAL DATA:  82 year old male with altered mental status, confusion. EXAM: CT HEAD WITHOUT CONTRAST TECHNIQUE: Contiguous axial images were obtained from the base of the skull through the vertex without intravenous contrast. COMPARISON:  None. FINDINGS: Brain: Cavum septum pellucidum, normal variant. Patchy hypodensity in the basal ganglia, most apparent in the right caudate compatible with a chronic lacunar infarct at that site. Small chronic appearing infarct in the left cerebellum (series 3, image 12). No midline shift, ventriculomegaly, mass effect, evidence of mass lesion, intracranial hemorrhage or evidence of cortically based acute infarction. No cerebral cortical encephalomalacia identified. Vascular: Calcified atherosclerosis at the skull base. Skull: Intact, negative. Sinuses/Orbits: Mild mucosal thickening in the left sphenoid sinus. Otherwise clear. Bilateral tympanic cavities and mastoids are clear. Other: Orbits soft tissues appear within normal limits; there is symmetric prominence of the bilateral superior ophthalmic veins. Questionable left posterior convexity scalp skin thickening or mild soft tissue injury (series 4, image 33). This resembles a small scalp hematoma on coronal image 55. IMPRESSION: 1. No definite acute intracranial abnormality. 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 2. Mild posterior left vertex scalp injury/hematoma suspected without underlying skull fracture. Consider recent trauma. Electronically Signed  By: Genevie Ann M.D.   On: 11/16/2017 08:02   Dg Chest Port 1 View  Result Date: 11/15/2017 CLINICAL DATA:  Dyspnea EXAM: PORTABLE CHEST 1 VIEW COMPARISON:  None. FINDINGS: The mediastinal contour is normal. The  heart size is enlarged. Consolidation of the left mid and lung base are noted. The right lung is clear. No acute abnormalities identified in the bony structures. IMPRESSION: Cardiomegaly. Consolidation left mid and lung base, underlying pneumonia not excluded. Electronically Signed   By: Abelardo Diesel M.D.   On: 11/15/2017 19:20   ASSESSMENT AND PLAN:  Dabney Schanz  82 y.o. male with a known history of systolic congestive heart failure presents with shortness of breath and swelling.  The patient is a very poor historian.  He states that he lived in [place] and now is in a rest home here  1.  Acute on chronic systolic congestive heart failure with anasarca.   -echo shows EF of 20 to 25% with severe hypo kinesis -cardiology consultation placed - Lasix 80 mg IV twice daily.    2.  Relative hypotension limited with any other medications for blood pressure and CHF at this point.  3.  Atrial fibrillation on amiodarone and aspirin  4.  Hypothyroidism unspecified on levothyroxine  6.  Depression on Zoloft.  I will switch this over to Celexa with his fluid weight gain  7.  Chronic kidney disease stage IV.  Monitor with diuresis patient has chronic kidney disease creatinine is around 2 to 2.5. I appreciate Dr. Assunta Gambles input  8.  Elevated troponin likely demand ischemia  9.  Moderate malnutrition dietitian to see  10. Scrotal swelling will get scrotal ultrasound  Patient is a very poor historian not much history is obtained. Will need to get in touch with family specially daughter in [place]. Palliative care consultation placed.      Case discussed with Care Management/Social Worker. Management plans discussed with the patient, family and they are in agreement.  CODE STATUS: full  DVT Prophylaxis: heparin  TOTAL TIME TAKING CARE OF THIS PATIENT: *30* minutes.  >50% time spent on counselling and coordination of care  POSSIBLE D/C IN few DAYS, DEPENDING ON CLINICAL  CONDITION.  Note: This dictation was prepared with Dragon dictation along with smaller phrase technology. Any transcriptional errors that result from this process are unintentional.  Fritzi Mandes M.D on 11/16/2017 at 1:25 PM  Between 7am to 6pm - Pager - [phone]  After 6pm go to [email or web address] - password [email or web address] [place] [facility]  Office  [phone]  [name]; [facility] Medical[facility] ID: [facility], male   DOB: 1936/05/24, 81 y.o.   MRN: [id number]

## 2017-11-16 NOTE — Clinical Social Work Note (Signed)
Clinical Social Work Assessment  Patient Details  Name: Alan Marshall MRN: [id number] Date of Birth: 11-12-1935  Date of referral:  11/16/17               Reason for consult:  Facility Placement                Permission sought to share information with:  Chartered certified accountant granted to share information::  Yes, Verbal Permission Granted  Name::        Agency::  [facility]  Relationship::     Contact Information:     Housing/Transportation Living arrangements for the past 2 months:  [place] of Information:  Patient, Scientist, water quality, Facility Patient Interpreter Needed:  None Criminal Activity/Legal Involvement Pertinent to Current Situation/Hospitalization:  No - Comment as needed Significant Relationships:  Adult Children, Warehouse manager, Significant Other Lives with:  Facility Resident Do you feel safe going back to the place where you live?  Yes Need for family participation in patient care:  No (Coment)  Care giving concerns:  Patient admitted from [place] Worker assessment / plan: The CSW met with the patient at bedside to discuss discharge planning. The patient was distressed due to urgent need for urination; he asked that the CSW contact the facility for information. The CSW contacted Amy at [facility] who confirmed that the patient is a LTC resident and can return. The CSW also gained demographic information to update the patient's record including SS#, appropriate emergency contacts, and the patient's PCP. The CSW updated such in the record.   The patient's HCPOA according to [facility] is his daughter, Elmyra Ricks. The CSW attempted to contact [place] with no success and no ability to leave voice mail. The patient has a significant other who he calls his wife. The CSW attempted to contact her with no success and no ability to leave voice mail.  The CSW will continue to follow for discharge facilitation  and has updated the [facility] that this patient is connected to the [facility]  Employment status:  Retired Forensic scientist:  [employer] PT Recommendations:  [place] / Referral to community resources:     Patient/Family's Response to care:  The patient was pleasant but confused.  Patient/Family's Understanding of and Emotional Response to Diagnosis, Current Treatment, and Prognosis:  The patient is in agreement with return to his facility.  Emotional Assessment Appearance:  Appears stated age Attitude/Demeanor/Rapport:  Apprehensive Affect (typically observed):  Pleasant, Apprehensive Orientation:  Oriented to Self, Oriented to Place Alcohol / Substance use:  Never Used Psych involvement (Current and /or in the community):  No (Comment)  Discharge Needs  Concerns to be addressed:  Care Coordination, Discharge Planning Concerns Readmission within the last 30 days:  No Current discharge risk:  Chronically ill Barriers to Discharge:  Continued Medical Work up   [employer], LCSW 11/16/2017, 11:52 AM

## 2017-11-17 DIAGNOSIS — E8770 Fluid overload, unspecified: Secondary | ICD-10-CM

## 2017-11-17 DIAGNOSIS — R609 Edema, unspecified: Secondary | ICD-10-CM

## 2017-11-17 DIAGNOSIS — R0602 Shortness of breath: Secondary | ICD-10-CM

## 2017-11-17 DIAGNOSIS — Z7189 Other specified counseling: Secondary | ICD-10-CM

## 2017-11-17 DIAGNOSIS — Z515 Encounter for palliative care: Secondary | ICD-10-CM

## 2017-11-17 LAB — RENAL FUNCTION PANEL
ANION GAP: 12 (ref 5–15)
Albumin: 3 g/dL — ABNORMAL LOW (ref 3.5–5.0)
BUN: 71 mg/dL — ABNORMAL HIGH (ref 6–20)
CALCIUM: 8.4 mg/dL — AB (ref 8.9–10.3)
CO2: 20 mmol/L — AB (ref 22–32)
Chloride: 110 mmol/L (ref 101–111)
Creatinine, Ser: 2.5 mg/dL — ABNORMAL HIGH (ref 0.61–1.24)
GFR calc Af Amer: 27 mL/min — ABNORMAL LOW (ref >60)
GFR calc non Af Amer: 23 mL/min — ABNORMAL LOW (ref >60)
GLUCOSE: 70 mg/dL (ref 65–99)
POTASSIUM: 3.4 mmol/L — AB (ref 3.5–5.1)
Phosphorus: 3.7 mg/dL (ref 2.5–4.6)
SODIUM: 142 mmol/L (ref 135–145)

## 2017-11-17 LAB — TSH: TSH: 8.897 u[IU]/mL — ABNORMAL HIGH (ref 0.350–4.500)

## 2017-11-17 MED ORDER — LEVOTHYROXINE SODIUM 100 MCG PO TABS
100.0000 ug | ORAL_TABLET | Freq: Every day | ORAL | Status: DC
  Administered 2017-11-18 – 2017-11-20 (×3): 100 ug via ORAL
  Filled 2017-11-17 (×3): qty 1

## 2017-11-17 MED ORDER — LATANOPROST 0.005 % OP SOLN: 1.0000 [drp] | Freq: Every day | OPHTHALMIC | Status: DC

## 2017-11-17 MED ORDER — AMIODARONE HCL 200 MG PO TABS
100.0000 mg | ORAL_TABLET | Freq: Every day | ORAL | Status: DC
  Administered 2017-11-17 – 2017-11-20 (×4): 100 mg via ORAL
  Filled 2017-11-17 (×4): qty 1

## 2017-11-17 MED ORDER — SERTRALINE HCL 50 MG PO TABS: 75.0000 mg | ORAL_TABLET | Freq: Every day | ORAL | Status: DC

## 2017-11-17 MED ORDER — DORZOLAMIDE HCL 2 % OP SOLN
1.0000 [drp] | Freq: Two times a day (BID) | OPHTHALMIC | Status: DC
  Administered 2017-11-17 – 2017-11-20 (×6): 1 [drp] via OPHTHALMIC
  Filled 2017-11-17: qty 10

## 2017-11-17 MED ORDER — ASPIRIN EC 81 MG PO TBEC: 81.0000 mg | DELAYED_RELEASE_TABLET | Freq: Every day | ORAL | Status: DC

## 2017-11-17 MED ORDER — CARVEDILOL 3.125 MG PO TABS
3.1250 mg | ORAL_TABLET | Freq: Two times a day (BID) | ORAL | Status: DC
  Administered 2017-11-18: 3.125 mg via ORAL
  Filled 2017-11-17 (×2): qty 1

## 2017-11-17 MED ORDER — POTASSIUM CHLORIDE CRYS ER 10 MEQ PO TBCR
10.0000 meq | EXTENDED_RELEASE_TABLET | Freq: Two times a day (BID) | ORAL | Status: AC
  Administered 2017-11-17 (×2): 10 meq via ORAL
  Filled 2017-11-17 (×2): qty 1

## 2017-11-17 NOTE — Consult Note (Signed)
[place] Nurse wound consult note Reason for Consult:bilateral LE wounds Patient admitted 11/15/17 with LE swelling and SOB. Today the LEs are slightly edematous.  Wound type: skin tear vs ruptured bulla LLE, puncture like area pretibial RLE with partial thickness ulceration just proximal to this Pressure Injury POA:NA Measurement:LLE: 2cm x 1cm x 0.1cm; RLE affected area 5cm x 3cm x 0.5cm  Wound bed:LLE: clean, pink, moist with scant drainage RLE with thick yellow drainage, no odor and 50% slough/50% pink Drainage (amount, consistency, odor) see above Periwound: intact, minimal edema, palpable distal pulses Dressing procedure/placement/frequency: Silver hydrofiber for the RLE wound to manage exudate and top with foam for protection.  Silicone foam to the LLE to protect and insulate. Change silver daily and foam every 3 days.    Discussed POC with patient and bedside nurse.  Re consult if needed, will not follow at this time. Thanks  Fed Ceci [employer], RN,CWOCN, CNS, [place] [phone])

## 2017-11-17 NOTE — Progress Notes (Signed)
Progress Note  Patient Name: Alan [facility] Date of Encounter: 11/17/2017  Primary Cardiologist: [facility]  Subjective   No complaints this morning. Has been talking to "the [place] people" this morning. Has diuresed 1.2 L for the admission to date. Weight 195-->194 pounds. Renal function continues to improve with diuresis. Potassium 3.4 this morning. TSH elevated.   Inpatient Medications    Scheduled Meds: . allopurinol  50 mg Oral Daily  . amiodarone  100 mg Oral Daily  . aspirin EC  81 mg Oral Daily  . citalopram  40 mg Oral Daily  . doxycycline  100 mg Oral Q12H  . furosemide  80 mg Intravenous BID  . heparin  5,000 Units Subcutaneous Q8H  . latanoprost  1 drop Both Eyes QHS  . levothyroxine  75 mcg Oral QAC breakfast  . pneumococcal 23 valent vaccine  0.5 mL Intramuscular Tomorrow-1000  . polyethylene glycol  17 g Oral Daily   Continuous Infusions:  PRN Meds: acetaminophen **OR** acetaminophen, albuterol, ondansetron **OR** ondansetron (ZOFRAN) IV   Vital Signs    Vitals:   11/16/17 1932 11/16/17 2013 11/17/17 0339 11/17/17 0745  BP: (!) 87/69 96/67 106/74 97/67  Pulse: 68 72 80 75  Resp: 20  (!) 22 18  Temp:   97.7 F (36.5 C) 97.8 F (36.6 C)  TempSrc:   Oral Oral  SpO2: 98%  95% 95%  Weight:   194 lb 14.4 oz (88.4 kg)   Height:        Intake/Output Summary (Last 24 hours) at 11/17/2017 0809 Last data filed at 11/17/2017 0500 Gross per 24 hour  Intake -  Output 1150 ml  Net -1150 ml   Filed Weights   11/15/17 2138 11/16/17 0456 11/17/17 0339  Weight: 195 lb 11.2 oz (88.8 kg) 195 lb (88.5 kg) 194 lb 14.4 oz (88.4 kg)    Telemetry    NSR - Personally Reviewed  ECG    n/a - Personally Reviewed  Physical Exam   GEN: No acute distress.   Neck: JVD elevated ~ 12 cm. Cardiac: RRR, II/VI systolic murmur at the apex, no rubs, or gallops.  Respiratory: Diminsihed breath sounds bilaterally.  GI: Soft, nontender, distended.   MS: 1+ bilateral lower  extremity edema to the thighs; No deformity. Neuro:  Alert and oriented x 3; Nonfocal.  Psych: Normal affect.  Labs    Chemistry Recent Labs  Lab 11/13/17 1215 11/15/17 1749 11/16/17 0421 11/17/17 0424  NA 138 138 141 142  K 4.0 4.3 3.5 3.4*  CL 105 106 108 110  CO2 17* 18* 19* 20*  GLUCOSE 92 108* 77 70  BUN 73* 77* 74* 71*  CREATININE 2.78* 2.67* 2.60* 2.50*  CALCIUM 8.4* 8.9 8.5* 8.4*  PROT 7.9 7.6  --   --   ALBUMIN 3.3* 3.3*  --  3.0*  AST 26 26  --   --   ALT 10* 10*  --   --   ALKPHOS 78 78  --   --   BILITOT 1.3* 1.2  --   --   GFRNONAA 20* 21* 22* 23*  GFRAA 23* 25* 25* 27*  ANIONGAP 16* 14 14 12      Hematology Recent Labs  Lab 11/15/17 1749 11/16/17 0421  WBC 6.6 6.0  RBC 3.75* 3.52*  HGB 11.0* 10.5*  HCT 34.3* 32.3*  MCV 91.7 91.6  MCH 29.5 29.7  MCHC 32.2 32.4  RDW 23.2* 24.2*  PLT 167 149*    Cardiac  Enzymes Recent Labs  Lab 11/15/17 1749  TROPONINI 0.11*   No results for input(s): TROPIPOC in the last 168 hours.   BNP Recent Labs  Lab 11/15/17 1749  BNP 2,886.0*     DDimer No results for input(s): DDIMER in the last 168 hours.   Radiology    Ct Head Wo Contrast  Result Date: 11/16/2017 IMPRESSION: 1. No definite acute intracranial abnormality. 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 2. Mild posterior left vertex scalp injury/hematoma suspected without underlying skull fracture. Consider recent trauma. Electronically Signed   By: Genevie Ann M.D.   On: 11/16/2017 08:02   US Renal  Result Date: 11/16/2017 IMPRESSION: Diffuse increased echotexture of bilateral kidneys which can be seen in medical renal disease. Simple cyst of left kidney. Electronically Signed   By: Abelardo Diesel M.D.   On: 11/16/2017 15:20   Dg Chest Port 1 View  Result Date: 11/15/2017 IMPRESSION: Cardiomegaly. Consolidation left mid and lung base, underlying pneumonia not excluded. Electronically Signed   By: Abelardo Diesel M.D.   On:  11/15/2017 19:20   US Scrotum W/doppler  Result Date: 11/16/2017 IMPRESSION: Enlarged fluid collection in the right scrotum with question bowel loop noted within it. The right testicle and right epididymis are not visualized. The left testicle is normal. Electronically Signed   By: Abelardo Diesel M.D.   On: 11/16/2017 15:19    Cardiac Studies   TTE 11/16/2017: Study Conclusions  - Left ventricle: The cavity size was mildly dilated. Systolic   function was severely reduced. The estimated ejection fraction   was < 20%. Diffuse hypokinesis. Regional wall motion   abnormalities cannot be excluded. The study is not technically   sufficient to allow evaluation of LV diastolic function. - Aortic valve: There was mild to moderate regurgitation. - Aorta: Ascending aortic diameter: 45 mm (S). - Ascending aorta: The ascending aorta was moderately dilated. - Mitral valve: There was moderate regurgitation. - Left atrium: The atrium was moderately dilated. - Right ventricle: The cavity size was moderately dilated. Wall   thickness was normal. Systolic function was moderately reduced. - Right atrium: The atrium was moderately dilated. - Pulmonary arteries: Systolic pressure was moderate to severely   elevated. PA peak pressure: 60 mm Hg (S). - Pericardium, extracardiac: Moderate to large sized   circumferential pericardial effusion was present but no   effusive-constrictive changes were noted. Features were not   consistent with tamponade physiology.  Impressions:  - Rhythm is atrial flutter.  Patient Profile     82 y.o. male with history of chronic systolic CHF due to NICM with prior ICD s/p extraction for infection in 2015 secondary to MSSA bacteremia, PAF not on anticoagulation, CKD stage IV, DM2, and dementia who was admitted to [facility] with volume overload.   Assessment & Plan    1. Acute on chronic systolic CHF due to NICM: -Status post prior ICD with extraction in 2015 for MSSA  bacteremia  -He continues to be volume up -Continue IV diuresis with IV Lasix 80 mg bid with KCl repletion -Not on evidence-based heart failure therapy secondary to hypotension, start when/if able -If his hypotension/renal function preclude diuresis, he may require transfer to the ICU for milrinone (if family would like, need their input as below) -TTE as above -Daily weights -Strict Is and Os  2. Afib: -Uncertain chronicity  -Has a history of PAF -Ventricular rate is well controlled, not on rate control given hypotension  -Not on  anticoagulation given high bleeding risk and dementia -Stop amiodarone    3. Acute on CKD stage IV: -Renal function improving with diuresis -Monitor  4. Anemia of chronic disease: -Stable  5. Elevated troponin: -No symptoms of chest pain -Not a candidate for invasive procedure given dementia and comorbid conditions  -Not on heparin gtt  6. Dementia: -Need to get in touch with family for [place]  7. Abnormal TSH: -Check free T4 and total T3  8. Hypokalemia: -Recommend gentle repletion to goal  9. Dispo: -Agree with Palliative care consult   For questions or updates, please contact [place] Please consult [email or web address] for contact info under Cardiology/STEMI.    Signed, Christell Faith, PA-C [place] Pager: [phone] 11/17/2017, 8:09 AM

## 2017-11-17 NOTE — Consult Note (Signed)
Consultation Note Date: 11/17/2017   Patient Name: Alan [facility]  DOB: [birth year]  MRN: [id number]  Age / Sex: 82 y.o., male  [facility]P: [facility] [place] Referring Physician: Fritzi Mandes, MD  Reason for Consultation: Establishing goals of care  HPI/Patient Profile: 82 y.o. male  admitted on 11/15/2017 from [facility] with swelling and shortness of breath. He has a past medical history of atrial flutter, systolic congestive heart failure, chronic kidney disease, hypertension, hypothyroidism, and glaucoma. Patient is a poor historian and no family was available at bedside. He reported to EDP that he was previously on dialysis but no longer goes. Also that 3 days prior to arrival his water pills had been increased. He reported significant swelling in his lower legs and feeling short of breath for 4-5 days prior to arrival. Shortness of breath caused him to be unable to lay flat. He also had some scrotal swelling. During his ED course he appeared grossly volume overloaded, but also had mild to moderate hypotension. EKG did not demonstrate ischemia but his troponin was minimally elevated likely some mild demand ischemia.  Denied chest pain.  No fevers or chills. Since admission he has been seen by cardiology and nephrology. Palliative Medicine Team consulted for goals of care.   Clinical Assessment and Goals of Care: I have reviewed medical records including lab results, imaging, Epic notes, and MAR, received report from the bedside RN, and assessed the patient. I spoke with his daughter Alan [facility]) to discuss diagnosis prognosis, GOC, EOL wishes, disposition and options. His daughter lives in [place]. Patient remains alert yet confused. He is unable to participate effectively in goals of care discussion with daughter and I.   I introduced Palliative Medicine as specialized medical care for people living  with serious illness. It focuses on providing relief from the symptoms and stress of a serious illness. The goal is to improve quality of life for both the patient and the family.  Daughter verbalized familiarity with palliative services as she states her father was under hospice and palliative services of [facility] on last year.  According to daughter he no longer received hospice services because he wanted to rescind his DNR and had no need for further services based on personal experiences with his mother.  We discussed a brief life review of the patient.  States Alan Marshall moved to [place] about 6 years ago.  His significant other, Alan Marshall is from [place] and chose to move back home so that she could be closer to her family.  Unfortunately all of his family lives on the [place].  He has 3 children all who live in [place].  He served in [employer] for over 30 years.  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  As far as functional and nutritional status states noticing a decline in his health over the past 3 years.  With a more severe and recent decline over the past year.  She states he  was constantly in and out of the [facility] the [place] [facility] due to CHF and fluid overload.  Over the past year it was recommended by his primary care physician to seek a more consistent facility and she states that is how he ended up at [facility].  She states he was at another facility prior to [facility] but family was not satisfied with the care there.  States she has not seen her father in several months now however his significant other reports 3 months ago he was able to get out of bed and ambulate with a walker and assistance.  She reports his appetite has always been good but seems to have declined some over the past month.  She is also noticed a worsening in his dementia.  We discussed his current illness and what it means in the larger  context of his on-going co-morbidities.  Natural disease trajectory and expectations at EOL were discussed.  Daughter states family is aware of his current condition however they feel that they should allow him some time to see if he will recover as he has been in this state before just may be not quite as critical.  We discussed his current evaluations by the cardiologist and his current functional state at this moment.  Daughter verbalized understanding.  I attempted to elicit values and goals of care important to the patient.    The difference between aggressive medical intervention and comfort care was considered in light of the patient's goals of care.  Daughter states she would like to continue with aggressive measures and treat the things that are treatable while hospitalized.  She is aware that he is in critical state and remains confused.  She states she has had several conversations with family about flying and to see him however she continues to struggle with sit in a set time to come because she knows she would not be able to stay long when she does.  Advanced directives, concepts specific to code status, artifical feeding and hydration, and rehospitalization were considered and discussed.  We discussed his current CODE STATUS.  Daughter states he would like for her father to continue to be a full code.  We discussed with a code situation would look like in relation to his current health status.  She verbalized awareness and states her father has always said he would not want to be a DNR based on an experience he had with his own mother.  Apparently his mother was a code and brought into the [facility] and his brother and sister made the decision to shift her to a DNR without discussing with him and unfortunately his mother passed away several hours after and he was unable to have a conversation with her.  Daughter states she feels like he was traumatized by this event which in the past made him  receiving his current DNR status for himself.  Daughter verbalizes her awareness of his critical illness and states that she will have a conversation with her family members regarding aggressive measures and CODE STATUS.  She states that with discussion with other family members she would not like his CODE STATUS changed and so she was able to come in and visit her father.  Which again she is unsure when this will occur.  He continues to state please contact her at any time if his status changes but continue to treat aggressively.  Hospice and Palliative Care services outpatient were explained and offered.  And  daughter reminded me that patient received hospice services on last year and this was confirmed that he was under hospice care from March to June 2018.  She was worried that this would intervene with his VA services.  At this time she would like for patient to receive outpatient palliative services once discharged or sent to [facility].  Questions and concerns were addressed. The family was encouraged to call with questions or concerns.  PMT will continue to support holistically.  Primary Decision Maker:  HCPOA-Daughter Stefon Ramthun (she resides in [place])   SUMMARY OF RECOMMENDATIONS    Full code at daughter's request.  Continue to treat the treatable.  Daughter verbalizes her wishes to proceed with current care even if this means aggressive measures such as CPR, intubation, or life-sustaining medications.  Case management consult for outpatient palliative services at discharge or transfer.  Palliative medicine team will continue to support patient, family, and medical team during hospitalization.  Code Status/Advance Care Planning:  Full code at daughter's request    Palliative Prophylaxis:   Aspiration, Delirium Protocol, Frequent Pain Assessment and Oral Care  Additional Recommendations (Limitations, Scope, Preferences):  Full Scope Treatment taken to treat the  treatable.  Daughter wishes to treat aggressively.  Psycho-social/Spiritual:   Desire for further Chaplaincy support:NO   Prognosis:   Unable to determine-guarded to poor in the setting of acute on chronic systolic congestive heart failure with anasarca (EF 20-25% atrophy), poor attention, atrial fibrillation, depression, stage IV chronic kidney disease, demand ischemia, dementia, poor p.o. intake, moderate malnutrition, deconditioning, immobility, and repeat hospitalizations.  Discharge Planning: To Be Determined outpatient palliative services at daughter's request.    Primary Diagnoses: Present on Admission: **None**   I have reviewed the medical record, interviewed the patient and family, and examined the patient. The following aspects are pertinent.  Past Medical History:  Diagnosis Date  . Anemia   . Atrial flutter ([place])   . CHF (congestive heart failure) ([place])   . Chronic kidney disease   . Glaucoma   . Hypertension   . Hypothyroidism   . Vitamin D deficiency    Social History   Socioeconomic History  . Marital status: Unknown    Spouse name: Not on file  . Number of children: Not on file  . Years of education: Not on file  . Highest education level: Not on file  Occupational History  . Not on file  Social Needs  . Financial resource strain: Not on file  . Food insecurity:    Worry: Not on file    Inability: Not on file  . Transportation needs:    Medical: Not on file    Non-medical: Not on file  Tobacco Use  . Smoking status: Never Smoker  . Smokeless tobacco: Never Used  Substance and Sexual Activity  . Alcohol use: Not Currently  . Drug use: Never  . Sexual activity: Not on file  Lifestyle  . Physical activity:    Days per week: Not on file    Minutes per session: Not on file  . Stress: Not on file  Relationships  . Social connections:    Talks on phone: Not on file    Gets together: Not on file    Attends religious service: Not on file     Active member of club or organization: Not on file    Attends meetings of clubs or organizations: Not on file    Relationship status: Not on file  Other Topics Concern  .  Not on file  Social History Narrative  . Not on file   Family History  Problem Relation Age of Onset  . Hypertension Father    Scheduled Meds: . allopurinol  50 mg Oral Daily  . aspirin EC  81 mg Oral Daily  . citalopram  40 mg Oral Daily  . doxycycline  100 mg Oral Q12H  . furosemide  80 mg Intravenous BID  . heparin  5,000 Units Subcutaneous Q8H  . latanoprost  1 drop Both Eyes QHS  . [START ON 11/18/2017] levothyroxine  100 mcg Oral QAC breakfast  . pneumococcal 23 valent vaccine  0.5 mL Intramuscular Tomorrow-1000  . polyethylene glycol  17 g Oral Daily  . potassium chloride  10 mEq Oral BID   Continuous Infusions: PRN Meds:.acetaminophen **OR** acetaminophen, albuterol, ondansetron **OR** ondansetron (ZOFRAN) IV Medications Prior to Admission:  Prior to Admission medications   Medication Sig Start Date End Date Taking? Authorizing Provider  acetaminophen (TYLENOL) 325 MG tablet Take 975 mg by mouth every 8 (eight) hours as needed.   Yes [provider]  allopurinol (ZYLOPRIM) 100 MG tablet Take 200 mg by mouth daily.   Yes [provider]  amiodarone (PACERONE) 100 MG tablet Take 100 mg by mouth daily.   Yes [provider]  aspirin EC 81 MG tablet Take 81 mg by mouth daily.   Yes [provider]  bisacodyl (DULCOLAX) 10 MG suppository Place 10 mg rectally daily as needed for moderate constipation.   Yes [provider]  dicloxacillin (DYNAPEN) 250 MG capsule Take 250 mg by mouth 3 (three) times daily. 10/22/17 11/21/17 Yes [provider]  dorzolamide (TRUSOPT) 2 % ophthalmic solution Place 1 drop into both eyes 2 (two) times daily.   Yes [provider]  ipratropium-albuterol (DUONEB) 0.5-2.5 (3) MG/3ML SOLN Take 3 mLs by nebulization every 6  (six) hours as needed.   Yes [provider]  Lactobacillus (ACIDOPHILUS PO) Take 175 mg by mouth every 8 (eight) hours as needed.   Yes [provider]  latanoprost (XALATAN) 0.005 % ophthalmic solution Place 1 drop into both eyes at bedtime.   Yes [provider]  levothyroxine (SYNTHROID, LEVOTHROID) 75 MCG tablet Take 75 mcg by mouth daily before breakfast.   Yes [provider]  loratadine (CLARITIN) 10 MG tablet Take 10 mg by mouth daily.   Yes [provider]  Melatonin 3 MG TABS Take 1 tablet by mouth at bedtime.   Yes [provider]  ondansetron (ZOFRAN) 8 MG tablet Take by mouth every 8 (eight) hours as needed for nausea or vomiting.   Yes [provider]  polyethylene glycol powder (GAVILAX) powder Take 17 g by mouth daily.   Yes [provider]  sennosides-docusate sodium (SENOKOT-S) 8.6-50 MG tablet Take 1 tablet by mouth 2 (two) times daily.   Yes [provider]  sertraline (ZOLOFT) 25 MG tablet Take 75 mg by mouth daily.   Yes [provider]  simethicone (MYLICON) 80 MG chewable tablet Chew 80 mg by mouth every 6 (six) hours as needed for flatulence.   Yes [provider]  torsemide (DEMADEX) 20 MG tablet Take 20 mg by mouth 2 (two) times daily.   Yes [provider]   Not on File Review of Systems  Unable to perform ROS: Dementia    Physical Exam  Constitutional: Vital signs are normal. He has a sickly appearance.  Cardiovascular: Intact distal pulses and normal pulses. An irregular  rhythm present.  Pulmonary/Chest: He has decreased breath sounds.  Abdominal: Soft. Bowel sounds are normal.  Neurological: He is alert. He is disoriented.  Psychiatric: Cognition and memory are impaired. He expresses inappropriate judgment.  Nursing note and vitals reviewed.   Vital Signs: BP 105/71   Pulse 76   Temp 97.8 F (36.6 C) (Oral)   Resp 18   Ht 6\' 2"  (1.88 m)   Wt  88.4 kg (194 lb 14.4 oz)   SpO2 95%   BMI 25.02 kg/m  Pain Scale: 0-10   Pain Score: Asleep   SpO2: SpO2: 95 % O2 Device:SpO2: 95 % O2 Flow Rate: .O2 Flow Rate (L/min): 2 L/min  IO: Intake/output summary:   Intake/Output Summary (Last 24 hours) at 11/17/2017 1413 Last data filed at 11/17/2017 1406 Gross per 24 hour  Intake 480 ml  Output 550 ml  Net -70 ml    LBM: Last BM Date: 11/16/17 Baseline Weight: Weight: 91.7 kg (202 lb 2.6 oz) Most recent weight: Weight: 88.4 kg (194 lb 14.4 oz)     Palliative Assessment/Data:PPS 30%   Time In: 1000 Time Out: 1115 Time Total: 75 min.   Greater than 50%  of this time was spent counseling and coordinating care related to the above assessment and plan.  Signed by: Alda Lea, NP-BC Palliative Medicine Team  Phone: [phone] Fax: [phone]   Please contact Palliative Medicine Team phone at [phone] for questions and concerns.  For individual provider: See Shea Evans

## 2017-11-17 NOTE — Progress Notes (Signed)
[facility] NAME: Alan Marshall    MR#:  [id number]  DATE OF BIRTH:  06/23/1935  SUBJECTIVE:   Patient is more alert and conversant today. Let's short of breath. Eight good breakfast. Denies any chest pain. Tells me he does not walk given his right knee swelling. He had knee surgery in the past. At what manner he is in a wheelchair. REVIEW OF SYSTEMS:   Review of Systems  Constitutional: Negative for chills, fever and weight loss.  HENT: Negative for ear discharge, ear pain and nosebleeds.   Eyes: Negative for blurred vision, pain and discharge.  Respiratory: Positive for shortness of breath. Negative for sputum production, wheezing and stridor.   Cardiovascular: Positive for leg swelling and PND. Negative for chest pain, palpitations and orthopnea.  Gastrointestinal: Negative for abdominal pain, diarrhea, nausea and vomiting.  Genitourinary: Negative for frequency and urgency.  Musculoskeletal: Negative for back pain and joint pain.  Neurological: Negative for sensory change, speech change, focal weakness and weakness.  Psychiatric/Behavioral: Negative for depression and hallucinations. The patient is not nervous/anxious.    Tolerating Diet:yes Tolerating PT: patient does not walk  DRUG ALLERGIES:  Not on File  VITALS:  Blood pressure 101/73, pulse 81, temperature 97.7 F (36.5 C), resp. rate 18, height 6\' 2"  (1.88 m), weight 88.4 kg (194 lb 14.4 oz), SpO2 93 %.  PHYSICAL EXAMINATION:   Physical Exam  GENERAL:  82 y.o.-year-old patient lying in the bed with no acute distress. Appears chronically ill generalized and [place] EYES: Pupils equal, round, reactive to light and accommodation. No scleral icterus. Extraocular muscles intact.  HEENT: Head atraumatic, normocephalic. Oropharynx and nasopharynx clear.  NECK:  Supple, no jugular venous distention. No thyroid enlargement, no tenderness.  LUNGS: decreased breath  sounds bilaterally, no wheezing, rales, rhonchi. No use of accessory muscles of respiration.  CARDIOVASCULAR: S1, S2 normal. No murmurs, rubs, or gallops.  ABDOMEN: Soft, nontender, nondistended. Bowel sounds present. No organomegaly or mass. Scrotal swelling significant amount right side more than left EXTREMITIES:+++ edema both lower extremities NEUROLOGIC: unable to assess. Patient moves all extremities well  PSYCHIATRIC:  patient is alert but confused  SKIN: No obvious rash, lesion, or ulcer.   LABORATORY PANEL:  CBC Recent Labs  Lab 11/16/17 0421  WBC 6.0  HGB 10.5*  HCT 32.3*  PLT 149*    Chemistries  Recent Labs  Lab 11/15/17 1749  11/17/17 0424  NA 138   < > 142  K 4.3   < > 3.4*  CL 106   < > 110  CO2 18*   < > 20*  GLUCOSE 108*   < > 70  BUN 77*   < > 71*  CREATININE 2.67*   < > 2.50*  CALCIUM 8.9   < > 8.4*  AST 26  --   --   ALT 10*  --   --   ALKPHOS 78  --   --   BILITOT 1.2  --   --    < > = values in this interval not displayed.   Cardiac Enzymes Recent Labs  Lab 11/15/17 1749  TROPONINI 0.11*   RADIOLOGY:  Ct Head Wo Contrast  Result Date: 11/16/2017 CLINICAL DATA:  82 year old male with altered mental status, confusion. EXAM: CT HEAD WITHOUT CONTRAST TECHNIQUE: Contiguous axial images were obtained from the base of the skull through the vertex without intravenous contrast. COMPARISON:  None. FINDINGS: Brain: Cavum septum pellucidum, normal  variant. Patchy hypodensity in the basal ganglia, most apparent in the right caudate compatible with a chronic lacunar infarct at that site. Small chronic appearing infarct in the left cerebellum (series 3, image 12). No midline shift, ventriculomegaly, mass effect, evidence of mass lesion, intracranial hemorrhage or evidence of cortically based acute infarction. No cerebral cortical encephalomalacia identified. Vascular: Calcified atherosclerosis at the skull base. Skull: Intact, negative. Sinuses/Orbits: Mild  mucosal thickening in the left sphenoid sinus. Otherwise clear. Bilateral tympanic cavities and mastoids are clear. Other: Orbits soft tissues appear within normal limits; there is symmetric prominence of the bilateral superior ophthalmic veins. Questionable left posterior convexity scalp skin thickening or mild soft tissue injury (series 4, image 33). This resembles a small scalp hematoma on coronal image 55. IMPRESSION: 1. No definite acute intracranial abnormality. 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 2. Mild posterior left vertex scalp injury/hematoma suspected without underlying skull fracture. Consider recent trauma. Electronically Signed   By: Genevie Ann M.D.   On: 11/16/2017 08:02   US Renal  Result Date: 11/16/2017 CLINICAL DATA:  Renal failure EXAM: RENAL / URINARY TRACT ULTRASOUND COMPLETE COMPARISON:  None. FINDINGS: Right Kidney: Length: 10.1 cm. Diffuse increased echotexture is noted. No mass or hydronephrosis visualized. Left Kidney: Length: 9.1 cm. Diffuse increased echotexture is noted. No hydronephrosis visualized. There is a 3.2 cm cyst in lower pole left kidney. Bladder: Appears normal for degree of bladder distention. IMPRESSION: Diffuse increased echotexture of bilateral kidneys which can be seen in medical renal disease. Simple cyst of left kidney. Electronically Signed   By: Abelardo Diesel M.D.   On: 11/16/2017 15:20   US Scrotum W/doppler  Result Date: 11/16/2017 CLINICAL DATA:  Swelling. EXAM: SCROTAL ULTRASOUND DOPPLER ULTRASOUND OF THE TESTICLES TECHNIQUE: Complete ultrasound examination of the testicles, epididymis, and other scrotal structures was performed. Color and spectral Doppler ultrasound were also utilized to evaluate blood flow to the testicles. COMPARISON:  None. FINDINGS: Right testicle Not visualized Left testicle Measurements: 3.5 x 1.4 x 2.6 cm. No mass or microlithiasis visualized. Right epididymis:  Not visualized. Left epididymis:   There is a 1.9 cm cyst in the left epididymis. Hydrocele:  None visualized. Varicocele:  None visualized. Pulsed Doppler interrogation of left testicle demonstrates normal low resistance arterial and venous waveforms bilaterally. There is a large fluid collection in the right scrotum with question bowel noted within it. IMPRESSION: Enlarged fluid collection in the right scrotum with question bowel loop noted within it. The right testicle and right epididymis are not visualized. The left testicle is normal. Electronically Signed   By: Abelardo Diesel M.D.   On: 11/16/2017 15:19   ASSESSMENT AND PLAN:  Alan Marshall  is a 82 y.o. male with a known history of systolic congestive heart failure presents with shortness of breath and swelling.  The patient is a very poor historian.  He states that he lived in [place] and now is in a rest home here  1.  Acute on chronic systolic congestive heart failure with anasarca.   -echo shows EF of 20 to 25% with severe hypo kinesis -cardiology consultation appreciated - Lasix 80 mg IV twice daily.   -patient had ICD placement in the past which was removed secondary to MSSA sepsis -echo shows pericardial effusion moderate amount without signs of tamponade  2.  Relative hypotension limited with any other medications for blood pressure and CHF at this point. -Start low-dose Coreg -consider adding spironolactone  3.  Atrial fibrillation  on amiodarone and aspirin  4.  Hypothyroidism unspecified on levothyroxine -TSH elevated 8.8 increase Synthroid  6.  Depression on Zoloft.  - will switch this over to Celexa with his fluid weight gain  7.  Chronic kidney disease stage IV.  Monitor with diuresis patient has chronic kidney disease creatinine is around 2 to 2.5. -appreciate neurology input  8.  Elevated troponin likely demand ischemia  9.  Moderate malnutrition dietitian to see  10. Scrotal swelling  - scrotal ultrasound showed fluid retention is found.  Noted some interest times herniated into the scrotum. No tenderness. -Consider surgical consultation if needed no signs of strangulation noted  Patient is a very poor historian not much history is obtained. Will need to get in touch with family specially daughter in [place]. Palliative care consultation placed.   Overall patient care is a very poor prognosis   Case discussed with Care Management/Social Worker. Management plans discussed with the patient, family and they are in agreement.  CODE STATUS: full  DVT Prophylaxis: heparin  TOTAL TIME TAKING CARE OF THIS PATIENT: *30* minutes.  >50% time spent on counselling and coordination of care  POSSIBLE D/C IN few DAYS, DEPENDING ON CLINICAL CONDITION.  Note: This dictation was prepared with Dragon dictation along with smaller phrase technology. Any transcriptional errors that result from this process are unintentional.  Fritzi Mandes M.D on 11/17/2017 at 7:43 PM  Between 7am to 6pm - Pager - [phone]  After 6pm go to [email or web address] - password [email or web address] [facility]  Office  [phone]  [name]; [facility]: [facility], male   DOB: 12-01-1935, 82 y.o.   MRN: [id number]

## 2017-11-17 NOTE — Progress Notes (Signed)
[facility], [place] 11/17/17  Subjective:   UOP 1150 cc S Creatinine 2.50 from 2.60 No acute c/o    Objective:  Vital signs in last 24 hours:  Temp:  [97.7 F (36.5 C)-97.8 F (36.6 C)] 97.8 F (36.6 C) (06/10 0745) Pulse Rate:  [68-80] 76 (06/10 0932) Resp:  [16-22] 18 (06/10 0745) BP: (87-106)/(67-76) 105/71 (06/10 0932) SpO2:  [95 %-99 %] 95 % (06/10 0745) Weight:  [88.4 kg (194 lb 14.4 oz)] 88.4 kg (194 lb 14.4 oz) (06/10 0339)  Weight change: -3.294 kg (-7 lb 4.2 oz) Filed Weights   11/15/17 2138 11/16/17 0456 11/17/17 0339  Weight: 88.8 kg (195 lb 11.2 oz) 88.5 kg (195 lb) 88.4 kg (194 lb 14.4 oz)    Intake/Output:    Intake/Output Summary (Last 24 hours) at 11/17/2017 1030 Last data filed at 11/17/2017 1009 Gross per 24 hour  Intake 240 ml  Output 1150 ml  Net -910 ml     Physical Exam: General: NAD, laying in bed  HEENT Anicteric, moist mucus membranes  Neck supple  Pulm/lungs Coarse breath sounds  CVS/Heart irregular  Abdomen:  Scrotal enlargement  Extremities: + dependent edema  Neurologic: Alert, able to answer questions             Basic Metabolic Panel:  Recent Labs  Lab 11/13/17 1215 11/15/17 1749 11/16/17 0421 11/17/17 0424  NA 138 138 141 142  K 4.0 4.3 3.5 3.4*  CL 105 106 108 110  CO2 17* 18* 19* 20*  GLUCOSE 92 108* 77 70  BUN 73* 77* 74* 71*  CREATININE 2.78* 2.67* 2.60* 2.50*  CALCIUM 8.4* 8.9 8.5* 8.4*  PHOS  --   --   --  3.7     CBC: Recent Labs  Lab 11/15/17 1749 11/16/17 0421  WBC 6.6 6.0  HGB 11.0* 10.5*  HCT 34.3* 32.3*  MCV 91.7 91.6  PLT 167 149*     No results found for: HEPBSAG, HEPBSAB, HEPBIGM    Microbiology:  Recent Results (from the past 240 hour(s))  MRSA PCR Screening     Status: None   Collection Time: 11/15/17 10:03 PM  Result Value Ref Range Status   MRSA by PCR NEGATIVE NEGATIVE Final    Comment:        The GeneXpert MRSA Assay (FDA approved for  NASAL specimens only), is one component of a comprehensive MRSA colonization surveillance program. It is not intended to diagnose MRSA infection nor to guide or monitor treatment for MRSA infections. Performed at [facility], [address], [place], [place] [address]   Urine Culture     Status: Abnormal (Preliminary result)   Collection Time: 11/15/17 11:36 PM  Result Value Ref Range Status   Specimen Description   Final    URINE, RANDOM Performed at [facility], [address], [place], [place] [address]    Special Requests   Final    Normal Performed at [facility], [address] [place], [place] [address]    Culture (A)  Final    80,000 COLONIES/mL Lonell Grandchild NEGATIVE RODS SUSCEPTIBILITIES TO FOLLOW Performed at [facility], [place] [address], [place], [place] [address]    Report Status PENDING  Incomplete    Coagulation Studies: Recent Labs    11/15/17 1749  LABPROT 18.4*  INR 1.54    Urinalysis: Recent Labs    11/15/17 2336  COLORURINE YELLOW*  LABSPEC 1.015  PHURINE 5.0  GLUCOSEU NEGATIVE  HGBUR NEGATIVE  BILIRUBINUR NEGATIVE  KETONESUR NEGATIVE  PROTEINUR NEGATIVE  NITRITE NEGATIVE  LEUKOCYTESUR TRACE*      Imaging: Ct Head Wo Contrast  Result Date: 11/16/2017 CLINICAL DATA:  82 year old male with altered mental status, confusion. EXAM: CT HEAD WITHOUT CONTRAST TECHNIQUE: Contiguous axial images were obtained from the base of the skull through the vertex without intravenous contrast. COMPARISON:  None. FINDINGS: Brain: Cavum septum pellucidum, normal variant. Patchy hypodensity in the basal ganglia, most apparent in the right caudate compatible with a chronic lacunar infarct at that site. Small chronic appearing infarct in the left cerebellum (series 3, image 12). No midline shift, ventriculomegaly, mass effect, evidence of mass lesion, intracranial hemorrhage or evidence of cortically based acute infarction. No cerebral  cortical encephalomalacia identified. Vascular: Calcified atherosclerosis at the skull base. Skull: Intact, negative. Sinuses/Orbits: Mild mucosal thickening in the left sphenoid sinus. Otherwise clear. Bilateral tympanic cavities and mastoids are clear. Other: Orbits soft tissues appear within normal limits; there is symmetric prominence of the bilateral superior ophthalmic veins. Questionable left posterior convexity scalp skin thickening or mild soft tissue injury (series 4, image 33). This resembles a small scalp hematoma on coronal image 55. IMPRESSION: 1. No definite acute intracranial abnormality. 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 2. Mild posterior left vertex scalp injury/hematoma suspected without underlying skull fracture. Consider recent trauma. Electronically Signed   By: Genevie Ann M.D.   On: 11/16/2017 08:02   US Renal  Result Date: 11/16/2017 CLINICAL DATA:  Renal failure EXAM: RENAL / URINARY TRACT ULTRASOUND COMPLETE COMPARISON:  None. FINDINGS: Right Kidney: Length: 10.1 cm. Diffuse increased echotexture is noted. No mass or hydronephrosis visualized. Left Kidney: Length: 9.1 cm. Diffuse increased echotexture is noted. No hydronephrosis visualized. There is a 3.2 cm cyst in lower pole left kidney. Bladder: Appears normal for degree of bladder distention. IMPRESSION: Diffuse increased echotexture of bilateral kidneys which can be seen in medical renal disease. Simple cyst of left kidney. Electronically Signed   By: Abelardo Diesel M.D.   On: 11/16/2017 15:20   Dg Chest Port 1 View  Result Date: 11/15/2017 CLINICAL DATA:  Dyspnea EXAM: PORTABLE CHEST 1 VIEW COMPARISON:  None. FINDINGS: The mediastinal contour is normal. The heart size is enlarged. Consolidation of the left mid and lung base are noted. The right lung is clear. No acute abnormalities identified in the bony structures. IMPRESSION: Cardiomegaly. Consolidation left mid and lung base, underlying  pneumonia not excluded. Electronically Signed   By: Abelardo Diesel M.D.   On: 11/15/2017 19:20   US Scrotum W/doppler  Result Date: 11/16/2017 CLINICAL DATA:  Swelling. EXAM: SCROTAL ULTRASOUND DOPPLER ULTRASOUND OF THE TESTICLES TECHNIQUE: Complete ultrasound examination of the testicles, epididymis, and other scrotal structures was performed. Color and spectral Doppler ultrasound were also utilized to evaluate blood flow to the testicles. COMPARISON:  None. FINDINGS: Right testicle Not visualized Left testicle Measurements: 3.5 x 1.4 x 2.6 cm. No mass or microlithiasis visualized. Right epididymis:  Not visualized. Left epididymis:  There is a 1.9 cm cyst in the left epididymis. Hydrocele:  None visualized. Varicocele:  None visualized. Pulsed Doppler interrogation of left testicle demonstrates normal low resistance arterial and venous waveforms bilaterally. There is a large fluid collection in the right scrotum with question bowel noted within it. IMPRESSION: Enlarged fluid collection in the right scrotum with question bowel loop noted within it. The right testicle and right epididymis are not visualized. The left testicle is normal. Electronically Signed  By: Abelardo Diesel M.D.   On: 11/16/2017 15:19     Medications:    . allopurinol  50 mg Oral Daily  . aspirin EC  81 mg Oral Daily  . citalopram  40 mg Oral Daily  . doxycycline  100 mg Oral Q12H  . furosemide  80 mg Intravenous BID  . heparin  5,000 Units Subcutaneous Q8H  . latanoprost  1 drop Both Eyes QHS  . [START ON 11/18/2017] levothyroxine  100 mcg Oral QAC breakfast  . pneumococcal 23 valent vaccine  0.5 mL Intramuscular Tomorrow-1000  . polyethylene glycol  17 g Oral Daily  . potassium chloride  10 mEq Oral BID   acetaminophen **OR** acetaminophen, albuterol, ondansetron **OR** ondansetron (ZOFRAN) IV  Assessment/ Plan:  82 y.o. black male with systolic congestive heart failure, atrial fibrillation, hypertension, glaucoma,  hypothyroidism, depression, who was admitted to [facility] on 11/15/2017 for acute exacerbation of systolic congestive heart failure.   1. Acute renal failure on chronic kidney disease stage III: baseline creatinine of 2.2, GFR of 36 from August 2015. No labs since then.  Urinalysis bland Chronic kidney disease most likely secondary to hypertension Acute injury from acute cardiorenal syndrome.  - Home regimen without an ACE-I/ARB - renal ultrasound- Diffuse- medical renal disease  2. Hypertension with acute exacerbation of systolic congestive heart failure. Echo with EF of 20-25%. Atrial fibrillation on examination - Currently on IV furosemide 80mg  IV q12.  3. Anemia with chronic kidney disease: hemoglobin 10.5. Normocytic      LOS: [place] 6/10/201910:30 AM  [place], [place]  Note: This note was prepared with Dragon dictation. Any transcription errors are unintentional

## 2017-11-18 DIAGNOSIS — I5043 Acute on chronic combined systolic (congestive) and diastolic (congestive) heart failure: Secondary | ICD-10-CM

## 2017-11-18 DIAGNOSIS — I483 Typical atrial flutter: Secondary | ICD-10-CM

## 2017-11-18 LAB — CBC
HCT: 32.3 % — ABNORMAL LOW (ref 40.0–52.0)
HEMOGLOBIN: 10.4 g/dL — AB (ref 13.0–18.0)
MCH: 29.7 pg (ref 26.0–34.0)
MCHC: 32.3 g/dL (ref 32.0–36.0)
MCV: 91.9 fL (ref 80.0–100.0)
Platelets: 167 10*3/uL (ref 150–440)
RBC: 3.51 MIL/uL — AB (ref 4.40–5.90)
RDW: 24.6 % — ABNORMAL HIGH (ref 11.5–14.5)
WBC: 6.1 10*3/uL (ref 3.8–10.6)

## 2017-11-18 LAB — URINE CULTURE
Culture: 80000 — AB
Special Requests: NORMAL

## 2017-11-18 LAB — BASIC METABOLIC PANEL
ANION GAP: 13 (ref 5–15)
BUN: 70 mg/dL — ABNORMAL HIGH (ref 6–20)
CALCIUM: 8.5 mg/dL — AB (ref 8.9–10.3)
CHLORIDE: 107 mmol/L (ref 101–111)
CO2: 19 mmol/L — AB (ref 22–32)
Creatinine, Ser: 2.38 mg/dL — ABNORMAL HIGH (ref 0.61–1.24)
GFR calc non Af Amer: 24 mL/min — ABNORMAL LOW (ref >60)
GFR, EST AFRICAN AMERICAN: 28 mL/min — AB (ref >60)
GLUCOSE: 98 mg/dL (ref 65–99)
POTASSIUM: 3.7 mmol/L (ref 3.5–5.1)
Sodium: 139 mmol/L (ref 135–145)

## 2017-11-18 MED ORDER — TRAMADOL HCL 50 MG PO TABS
50.0000 mg | ORAL_TABLET | Freq: Three times a day (TID) | ORAL | Status: DC | PRN
  Administered 2017-11-18 (×2): 50 mg via ORAL
  Filled 2017-11-18 (×2): qty 1

## 2017-11-18 MED ORDER — TORSEMIDE 20 MG PO TABS: 40.0000 mg | ORAL_TABLET | Freq: Every day | ORAL | Status: DC

## 2017-11-18 MED ORDER — ENSURE ENLIVE PO LIQD
237.0000 mL | Freq: Two times a day (BID) | ORAL | Status: DC
  Administered 2017-11-18 – 2017-11-20 (×4): 237 mL via ORAL

## 2017-11-18 NOTE — Progress Notes (Signed)
[facility], [place] 11/18/17  Subjective:   Some further improvement in S Creatinine Ate few bites of breakfast; poor appetite Peripheral edema is better  Objective:  Vital signs in last 24 hours:  Temp:  [97.5 F (36.4 C)-97.8 F (36.6 C)] 97.5 F (36.4 C) (06/11 0809) Pulse Rate:  [76-87] 76 (06/11 0809) Resp:  [16-18] 16 (06/11 0809) BP: (96-101)/(71-78) 100/77 (06/11 0809) SpO2:  [91 %-96 %] 94 % (06/11 0809) Weight:  [88 kg (194 lb)] 88 kg (194 lb) (06/11 0347)  Weight change: -0.408 kg (-14.4 oz) Filed Weights   11/16/17 0456 11/17/17 0339 11/18/17 0347  Weight: 88.5 kg (195 lb) 88.4 kg (194 lb 14.4 oz) 88 kg (194 lb)    Intake/Output:    Intake/Output Summary (Last 24 hours) at 11/18/2017 0948 Last data filed at 11/17/2017 1406 Gross per 24 hour  Intake 480 ml  Output -  Net 480 ml     Physical Exam: General: NAD, laying in bed  HEENT Anicteric, moist mucus membranes  Neck supple  Pulm/lungs Coarse breath sounds  CVS/Heart irregular  Abdomen:  Scrotal enlargement  Extremities: + dependent edema,  Skin breakdown on shins  Neurologic: Alert, able to answer questions             Basic Metabolic Panel:  Recent Labs  Lab 11/13/17 1215 11/15/17 1749 11/16/17 0421 11/17/17 0424 11/18/17 0806  NA 138 138 141 142 139  K 4.0 4.3 3.5 3.4* 3.7  CL 105 106 108 110 107  CO2 17* 18* 19* 20* 19*  GLUCOSE 92 108* 77 70 98  BUN 73* 77* 74* 71* 70*  CREATININE 2.78* 2.67* 2.60* 2.50* 2.38*  CALCIUM 8.4* 8.9 8.5* 8.4* 8.5*  PHOS  --   --   --  3.7  --      CBC: Recent Labs  Lab 11/15/17 1749 11/16/17 0421 11/18/17 0806  WBC 6.6 6.0 6.1  HGB 11.0* 10.5* 10.4*  HCT 34.3* 32.3* 32.3*  MCV 91.7 91.6 91.9  PLT 167 149* 167     No results found for: HEPBSAG, HEPBSAB, HEPBIGM    Microbiology:  Recent Results (from the past 240 hour(s))  MRSA PCR Screening     Status: None   Collection Time: 11/15/17 10:03 PM   Result Value Ref Range Status   MRSA by PCR NEGATIVE NEGATIVE Final    Comment:        The GeneXpert MRSA Assay (FDA approved for NASAL specimens only), is one component of a comprehensive MRSA colonization surveillance program. It is not intended to diagnose MRSA infection nor to guide or monitor treatment for MRSA infections. Performed at [facility], [address], [place], [place] [address]   Urine Culture     Status: Abnormal   Collection Time: 11/15/17 11:36 PM  Result Value Ref Range Status   Specimen Description   Final    URINE, RANDOM Performed at [facility], [address], [place], [place] [address]    Special Requests   Final    Normal Performed at [facility], [place], [place] [address]    Culture 80,000 COLONIES/mL ESCHERICHIA COLI (A)  Final   Report Status 11/18/2017 FINAL  Final   Organism ID, Bacteria ESCHERICHIA COLI (A)  Final      Susceptibility   Escherichia coli - MIC*    AMPICILLIN >=32 RESISTANT Resistant     CEFAZOLIN 32 INTERMEDIATE Intermediate     CEFTRIAXONE <=1  SENSITIVE Sensitive     CIPROFLOXACIN >=4 RESISTANT Resistant     GENTAMICIN <=1 SENSITIVE Sensitive     IMIPENEM 0.5 SENSITIVE Sensitive     NITROFURANTOIN 32 SENSITIVE Sensitive     TRIMETH/SULFA <=20 SENSITIVE Sensitive     AMPICILLIN/SULBACTAM 8 SENSITIVE Sensitive     PIP/TAZO <=4 SENSITIVE Sensitive     Extended ESBL NEGATIVE Sensitive     * 80,000 COLONIES/mL ESCHERICHIA COLI    Coagulation Studies: Recent Labs    11/15/17 1749  LABPROT 18.4*  INR 1.54    Urinalysis: Recent Labs    11/15/17 2336  COLORURINE YELLOW*  LABSPEC 1.015  PHURINE 5.0  GLUCOSEU NEGATIVE  HGBUR NEGATIVE  BILIRUBINUR NEGATIVE  KETONESUR NEGATIVE  PROTEINUR NEGATIVE  NITRITE NEGATIVE  LEUKOCYTESUR TRACE*      Imaging: US Renal  Result Date: 11/16/2017 CLINICAL DATA:  Renal failure EXAM: RENAL / URINARY TRACT ULTRASOUND COMPLETE  COMPARISON:  None. FINDINGS: Right Kidney: Length: 10.1 cm. Diffuse increased echotexture is noted. No mass or hydronephrosis visualized. Left Kidney: Length: 9.1 cm. Diffuse increased echotexture is noted. No hydronephrosis visualized. There is a 3.2 cm cyst in lower pole left kidney. Bladder: Appears normal for degree of bladder distention. IMPRESSION: Diffuse increased echotexture of bilateral kidneys which can be seen in medical renal disease. Simple cyst of left kidney. Electronically Signed   By: Abelardo Diesel M.D.   On: 11/16/2017 15:20   US Scrotum W/doppler  Result Date: 11/16/2017 CLINICAL DATA:  Swelling. EXAM: SCROTAL ULTRASOUND DOPPLER ULTRASOUND OF THE TESTICLES TECHNIQUE: Complete ultrasound examination of the testicles, epididymis, and other scrotal structures was performed. Color and spectral Doppler ultrasound were also utilized to evaluate blood flow to the testicles. COMPARISON:  None. FINDINGS: Right testicle Not visualized Left testicle Measurements: 3.5 x 1.4 x 2.6 cm. No mass or microlithiasis visualized. Right epididymis:  Not visualized. Left epididymis:  There is a 1.9 cm cyst in the left epididymis. Hydrocele:  None visualized. Varicocele:  None visualized. Pulsed Doppler interrogation of left testicle demonstrates normal low resistance arterial and venous waveforms bilaterally. There is a large fluid collection in the right scrotum with question bowel noted within it. IMPRESSION: Enlarged fluid collection in the right scrotum with question bowel loop noted within it. The right testicle and right epididymis are not visualized. The left testicle is normal. Electronically Signed   By: Abelardo Diesel M.D.   On: 11/16/2017 15:19     Medications:    . allopurinol  50 mg Oral Daily  . amiodarone  100 mg Oral Daily  . aspirin EC  81 mg Oral Daily  . carvedilol  3.125 mg Oral BID WC  . citalopram  40 mg Oral Daily  . dorzolamide  1 drop Both Eyes BID  . doxycycline  100 mg Oral  Q12H  . furosemide  80 mg Intravenous BID  . heparin  5,000 Units Subcutaneous Q8H  . latanoprost  1 drop Both Eyes QHS  . levothyroxine  100 mcg Oral QAC breakfast  . pneumococcal 23 valent vaccine  0.5 mL Intramuscular Tomorrow-1000  . polyethylene glycol  17 g Oral Daily   acetaminophen **OR** acetaminophen, albuterol, ondansetron **OR** ondansetron (ZOFRAN) IV, traMADol  Assessment/ Plan:  82 y.o. black male with systolic congestive heart failure, atrial fibrillation, hypertension, glaucoma, hypothyroidism, depression, who was admitted to [facility] on 11/15/2017 for acute exacerbation of systolic congestive heart failure.   1. Acute renal failure on chronic kidney disease stage III: baseline creatinine of 2.2, GFR  of 36 from August 2015. No labs since then.  Urinalysis bland Chronic kidney disease most likely secondary to hypertension Acute injury from acute cardiorenal syndrome.  - Home regimen without an ACE-I/ARB - renal ultrasound- Diffuse- medical renal disease - S Creatinine 2.4 today  2. Hypertension with acute exacerbation of systolic congestive heart failure. Echo with EF of 20-25%. Atrial fibrillation on examination -change diuretic to oral torsemide 40 mg daily  3. Anemia with chronic kidney disease: hemoglobin 10.5. Normocytic      LOS: 3 Linsey Hirota Candiss Norse 6/11/20199:48 AM  [place], [place]  Note: This note was prepared with Dragon dictation. Any transcription errors are unintentional

## 2017-11-18 NOTE — Progress Notes (Signed)
[facility] [place] NAME: Alan Marshall    MR#:  [id number]  DATE OF BIRTH:  1936-01-26  SUBJECTIVE:   Patient out in the recliner. Feels a lot better. He states he is urinating a lot. Breathing much better. Leg swelling improved. Denies any scrotal pain. REVIEW OF SYSTEMS:   Review of Systems  Constitutional: Negative for chills, fever and weight loss.  HENT: Negative for ear discharge, ear pain and nosebleeds.   Eyes: Negative for blurred vision, pain and discharge.  Respiratory: Positive for shortness of breath. Negative for sputum production, wheezing and stridor.   Cardiovascular: Positive for leg swelling and PND. Negative for chest pain, palpitations and orthopnea.  Gastrointestinal: Negative for abdominal pain, diarrhea, nausea and vomiting.  Genitourinary: Negative for frequency and urgency.  Musculoskeletal: Negative for back pain and joint pain.  Neurological: Negative for sensory change, speech change, focal weakness and weakness.  Psychiatric/Behavioral: Negative for depression and hallucinations. The patient is not nervous/anxious.    Tolerating Diet:yes Tolerating PT: patient does not walk  DRUG ALLERGIES:  No Known Allergies  VITALS:  Blood pressure 100/77, pulse 76, temperature (!) 97.5 F (36.4 C), temperature source Oral, resp. rate 16, height 6\' 2"  (1.88 m), weight 88 kg (194 lb), SpO2 94 %.  PHYSICAL EXAMINATION:   Physical Exam  GENERAL:  82 y.o.-year-old patient lying in the bed with no acute distress. Appears chronically ill generalized and Alan Marshall EYES: Pupils equal, round, reactive to light and accommodation. No scleral icterus. Extraocular muscles intact.  HEENT: Head atraumatic, normocephalic. Oropharynx and nasopharynx clear.  NECK:  Supple, no jugular venous distention. No thyroid enlargement, no tenderness.  LUNGS: decreased breath sounds bilaterally, no wheezing, rales, rhonchi. No use of  accessory muscles of respiration.  CARDIOVASCULAR: S1, S2 normal. No murmurs, rubs, or gallops.  ABDOMEN: Soft, nontender, nondistended. Bowel sounds present. No organomegaly or mass. Scrotal swelling significant amount right side more than left EXTREMITIES:++ edema both lower extremities NEUROLOGIC: unable to assess. Patient moves all extremities well  PSYCHIATRIC:  patient is alert but confused  SKIN: No obvious rash, lesion, or ulcer.   LABORATORY PANEL:  CBC Recent Labs  Lab 11/18/17 0806  WBC 6.1  HGB 10.4*  HCT 32.3*  PLT 167    Chemistries  Recent Labs  Lab 11/15/17 1749  11/18/17 0806  NA 138   < > 139  K 4.3   < > 3.7  CL 106   < > 107  CO2 18*   < > 19*  GLUCOSE 108*   < > 98  BUN 77*   < > 70*  CREATININE 2.67*   < > 2.38*  CALCIUM 8.9   < > 8.5*  AST 26  --   --   ALT 10*  --   --   ALKPHOS 78  --   --   BILITOT 1.2  --   --    < > = values in this interval not displayed.   Cardiac Enzymes Recent Labs  Lab 11/15/17 1749  TROPONINI 0.11*   RADIOLOGY:  US Renal  Result Date: 11/16/2017 CLINICAL DATA:  Renal failure EXAM: RENAL / URINARY TRACT ULTRASOUND COMPLETE COMPARISON:  None. FINDINGS: Right Kidney: Length: 10.1 cm. Diffuse increased echotexture is noted. No mass or hydronephrosis visualized. Left Kidney: Length: 9.1 cm. Diffuse increased echotexture is noted. No hydronephrosis visualized. There is a 3.2 cm cyst in lower pole left kidney. Bladder: Appears normal for degree  of bladder distention. IMPRESSION: Diffuse increased echotexture of bilateral kidneys which can be seen in medical renal disease. Simple cyst of left kidney. Electronically Signed   By: Alan Marshall M.D.   On: 11/16/2017 15:20   US Scrotum W/doppler  Result Date: 11/16/2017 CLINICAL DATA:  Swelling. EXAM: SCROTAL ULTRASOUND DOPPLER ULTRASOUND OF THE TESTICLES TECHNIQUE: Complete ultrasound examination of the testicles, epididymis, and other scrotal structures was performed. Color and  spectral Doppler ultrasound were also utilized to evaluate blood flow to the testicles. COMPARISON:  None. FINDINGS: Right testicle Not visualized Left testicle Measurements: 3.5 x 1.4 x 2.6 cm. No mass or microlithiasis visualized. Right epididymis:  Not visualized. Left epididymis:  There is a 1.9 cm cyst in the left epididymis. Hydrocele:  None visualized. Varicocele:  None visualized. Pulsed Doppler interrogation of left testicle demonstrates normal low resistance arterial and venous waveforms bilaterally. There is a large fluid collection in the right scrotum with question bowel noted within it. IMPRESSION: Enlarged fluid collection in the right scrotum with question bowel loop noted within it. The right testicle and right epididymis are not visualized. The left testicle is normal. Electronically Signed   By: Alan Marshall M.D.   On: 11/16/2017 15:19   ASSESSMENT AND PLAN:  Alan Marshall  is a 82 y.o. male with a known history of systolic congestive heart failure presents with shortness of breath and swelling.  The patient is a very poor historian.  He states that he lived in [place] and now is in a rest home here  1.  Acute on chronic systolic congestive heart failure with anasarca.   -echo shows EF of 20 to 25% with severe hypo kinesis -cardiology consultation appreciated - Lasix 80 mg IV twice daily. ---Diuresis well. Change to torsemide 40 mg daily. -patient had ICD placement in the past which was removed secondary to MSSA sepsis -echo shows pericardial effusion moderate amount without signs of tamponade  2. Relative hypotension limited with any other medications for blood pressure and CHF at this point. -Start low-dose Coreg -consider adding spironolactone  3. Atrial fibrillation on amiodarone and aspirin  4.Hypothyroidism unspecified on levothyroxine -TSH elevated 8.8 increase Synthroid  6.Depression on Zoloft.  - will switch this over to Celexa with his fluid weight gain  7.  Chronic kidney disease stage IV.  Monitor with diuresis patient has chronic kidney disease creatinine is around 2 to 2.5. -appreciate neurology input  8.Elevated troponin likely demand ischemia  9.Moderate malnutrition dietitian to see  10. Scrotal swelling due to anasarca - scrotal ultrasound showed fluid retention is found. Noted some interest times herniated into the scrotum. No tenderness. - no signs of strangulation noted  Overall patient care has  Poor long term prognosis.  She needs to improve will discharge to [facility] tomorrow which is patient's long-term facility  Case discussed with Care Management/Social Worker. Management plans discussed with the patient, family and they are in agreement.  CODE STATUS: full  DVT Prophylaxis: heparin  TOTAL TIME TAKING CARE OF THIS PATIENT: *30* minutes.  >50% time spent on counselling and coordination of care  POSSIBLE D/C IN few DAYS, DEPENDING ON CLINICAL CONDITION.  Note: This dictation was prepared with Dragon dictation along with smaller phrase technology. Any transcriptional errors that result from this process are unintentional.  Fritzi Mandes M.D on 11/18/2017 at 2:12 PM  Between 7am to 6pm - Pager - [phone]  After 6pm go to [email or web address] - Proofreader  [employer]  [phone]  [name]; [facility] Medical[facility]: Alan Marshall, male   DOB: 03/18/1936, 82 y.o.   MRN: [id number]

## 2017-11-18 NOTE — Progress Notes (Addendum)
Progress Note  Patient Name: Alan Marshall Date of Encounter: 11/18/2017  Primary Cardiologist: [facility]  Subjective   No complaints this morning. Feels like his SOB is a little better. No labs today. Documented output of 770 mL for the admission. Weight 194-->194 pounds.   Inpatient Medications    Scheduled Meds: . allopurinol  50 mg Oral Daily  . amiodarone  100 mg Oral Daily  . aspirin EC  81 mg Oral Daily  . carvedilol  3.125 mg Oral BID WC  . citalopram  40 mg Oral Daily  . dorzolamide  1 drop Both Eyes BID  . doxycycline  100 mg Oral Q12H  . furosemide  80 mg Intravenous BID  . heparin  5,000 Units Subcutaneous Q8H  . latanoprost  1 drop Both Eyes QHS  . levothyroxine  100 mcg Oral QAC breakfast  . pneumococcal 23 valent vaccine  0.5 mL Intramuscular Tomorrow-1000  . polyethylene glycol  17 g Oral Daily   Continuous Infusions:  PRN Meds: acetaminophen **OR** acetaminophen, albuterol, ondansetron **OR** ondansetron (ZOFRAN) IV   Vital Signs    Vitals:   11/17/17 1637 11/17/17 2010 11/18/17 0333 11/18/17 0347  BP: 101/73 96/71 100/78   Pulse: 81 86 87   Resp: 18 18 18    Temp: 97.7 F (36.5 C) 97.8 F (36.6 C) (!) 97.5 F (36.4 C)   TempSrc:  Oral Oral   SpO2: 93% 96% 91%   Weight:    194 lb (88 kg)  Height:        Intake/Output Summary (Last 24 hours) at 11/18/2017 0750 Last data filed at 11/17/2017 1406 Gross per 24 hour  Intake 480 ml  Output -  Net 480 ml   Filed Weights   11/16/17 0456 11/17/17 0339 11/18/17 0347  Weight: 195 lb (88.5 kg) 194 lb 14.4 oz (88.4 kg) 194 lb (88 kg)    Telemetry    May have converted to NSR - Personally Reviewed  ECG    n/a - Personally Reviewed  Physical Exam   GEN: No acute distress.   Neck: JVD elevated ~ 12 cm. Cardiac: RRR, II/VI systolic murmur at the apex, no rubs, or gallops.  Respiratory: Diminsihed breath sounds bilaterally.  GI: Soft, nontender, distended.   MS: 1+ edema of the bilateral  thighs; No deformity. Neuro:  Alert and oriented x 3; Nonfocal.  Psych: Normal affect.  Labs    Chemistry Recent Labs  Lab 11/13/17 1215 11/15/17 1749 11/16/17 0421 11/17/17 0424  NA 138 138 141 142  K 4.0 4.3 3.5 3.4*  CL 105 106 108 110  CO2 17* 18* 19* 20*  GLUCOSE 92 108* 77 70  BUN 73* 77* 74* 71*  CREATININE 2.78* 2.67* 2.60* 2.50*  CALCIUM 8.4* 8.9 8.5* 8.4*  PROT 7.9 7.6  --   --   ALBUMIN 3.3* 3.3*  --  3.0*  AST 26 26  --   --   ALT 10* 10*  --   --   ALKPHOS 78 78  --   --   BILITOT 1.3* 1.2  --   --   GFRNONAA 20* 21* 22* 23*  GFRAA 23* 25* 25* 27*  ANIONGAP 16* 14 14 12      Hematology Recent Labs  Lab 11/15/17 1749 11/16/17 0421  WBC 6.6 6.0  RBC 3.75* 3.52*  HGB 11.0* 10.5*  HCT 34.3* 32.3*  MCV 91.7 91.6  MCH 29.5 29.7  MCHC 32.2 32.4  RDW 23.2* 24.2*  PLT  167 149*    Cardiac Enzymes Recent Labs  Lab 11/15/17 1749  TROPONINI 0.11*   No results for input(s): TROPIPOC in the last 168 hours.   BNP Recent Labs  Lab 11/15/17 1749  BNP 2,886.0*     DDimer No results for input(s): DDIMER in the last 168 hours.   Radiology    Ct Head Wo Contrast  Result Date: 11/16/2017 IMPRESSION: 1. No definite acute intracranial abnormality. 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 2. Mild posterior left vertex scalp injury/hematoma suspected without underlying skull fracture. Consider recent trauma. Electronically Signed   By: Genevie Ann M.D.   On: 11/16/2017 08:02   US Renal  Result Date: 11/16/2017 IMPRESSION: Diffuse increased echotexture of bilateral kidneys which can be seen in medical renal disease. Simple cyst of left kidney. Electronically Signed   By: Abelardo Diesel M.D.   On: 11/16/2017 15:20   Dg Chest Port 1 View  Result Date: 11/15/2017 IMPRESSION: Cardiomegaly. Consolidation left mid and lung base, underlying pneumonia not excluded. Electronically Signed   By: Abelardo Diesel M.D.   On: 11/15/2017 19:20    US Scrotum W/doppler  Result Date: 11/16/2017 IMPRESSION: Enlarged fluid collection in the right scrotum with question bowel loop noted within it. The right testicle and right epididymis are not visualized. The left testicle is normal. Electronically Signed   By: Abelardo Diesel M.D.   On: 11/16/2017 15:19    Cardiac Studies   TTE 11/16/2017: Study Conclusions  - Left ventricle: The cavity size was mildly dilated. Systolic   function was severely reduced. The estimated ejection fraction   was < 20%. Diffuse hypokinesis. Regional wall motion   abnormalities cannot be excluded. The study is not technically   sufficient to allow evaluation of LV diastolic function. - Aortic valve: There was mild to moderate regurgitation. - Aorta: Ascending aortic diameter: 45 mm (S). - Ascending aorta: The ascending aorta was moderately dilated. - Mitral valve: There was moderate regurgitation. - Left atrium: The atrium was moderately dilated. - Right ventricle: The cavity size was moderately dilated. Wall   thickness was normal. Systolic function was moderately reduced. - Right atrium: The atrium was moderately dilated. - Pulmonary arteries: Systolic pressure was moderate to severely   elevated. PA peak pressure: 60 mm Hg (S). - Pericardium, extracardiac: Moderate to large sized   circumferential pericardial effusion was present but no   effusive-constrictive changes were noted. Features were not   consistent with tamponade physiology.  Impressions:  - Rhythm is atrial flutter.  Patient Profile     82 y.o. male with history of chronic systolic CHF due to NICM with prior ICD s/p extraction for infection in 2015 secondary to MSSA bacteremia, PAF not on anticoagulation, CKD stage IV, DM2, and dementia who was admitted to [facility] with volume overload.   Assessment & Plan    1. Acute on chronic systolic CHF due to NICM: -Status post prior ICD with extraction in 2015 for MSSA bacteremia  -He  continues to be volume up -Continue IV diuresis with IV Lasix 80 mg bid with KCl repletion -Tolerating low-dose Coreg -Not on evidence-based heart failure therapy secondary to hypotension, start when/if able -If his hypotension/renal function preclude diuresis, he may require transfer to the ICU for milrinone (if family would like, need their input as below) -TTE as above -Daily weights -Strict Is and Os  2. Afib: -Uncertain chronicity  -Has a history of PAF -Ventricular rate is well controlled,  not on rate control given hypotension  -Not on anticoagulation given high bleeding risk and dementia -May have converted to sinus rhtyhm -Continue amiodarone and Coreg  3. Acute on CKD stage IV: -No labs this morning -Monitor  4. Anemia of chronic disease: -No labs this morning -Stable  5. Elevated troponin: -No symptoms of chest pain -Not a candidate for invasive procedure given dementia and comorbid conditions  -Not on heparin gtt  6. Dementia: -Per IM  7. Abnormal TSH: -Recommend checking free T4 and total T3 -Defer to IM  8. Hypokalemia: -No labs this morning -Recommend gentle repletion to goal  9. Dispo: -Agree with Palliative care consult -Overall, poor prognosis -Daughter continues to want aggressive care and full code   For questions or updates, please contact [place] Please consult [email or web address] for contact info under Cardiology/STEMI.    Signed, Christell Faith, PA-C [place] Pager: [phone] 11/18/2017, 7:50 AM   Attending Note Patient seen and examined, agree with detailed note above,  Patient presentation and plan discussed on rounds.   shortness of breath mildly improved no dramatic urine output yesterday  Review echocardiogram with him Massively dilated LV RV both atriums with biventricular failure Exacerbated by atrial flutter  On exam JVD 12+ lungs with rales at the bases, dullness, heart sounds MOLMBEM7/5 systolic ejection  murmur heard left sternal border, Abdomen soft nontender Pitting edema to the thighs  Lab work reviewed creatinine 2.38 BUN 70 hematocrit 32  A/P: Acute on chronic systolic CHF Biventricular failure massively dilated atria Cardiorenal syndrome Notes from palliative care indicating aggressive care desired by family If urine output drops off, could consider Lasix infusion, even milrinone infusion in the ICU  Atrial flutter/fib Not on anticoagulation Rate controlled Likely contributing to above Currently with no plans to restore normal sinus rhythm. Suspect it would not hold given massively dilated atria, severe biventricular LV dysfunction   Greater than 50% was spent in counseling and coordination of care with patient Total encounter time 25 minutes or more   Signed: Esmond Plants  M.D., Ph.D. [facility]

## 2017-11-18 NOTE — Progress Notes (Signed)
Physical Therapy Treatment Patient Details Name: Alan Marshall MRN: [id number] DOB: [birth year] Today's Date: 11/18/2017    History of Present Illness Pt is a 82y/o male admitted for acute on chronic CHF with scrotal swelling. Pt is a long term care resident at [facility]. Pt's PMH is significant for the following: systolic congestive heart failure, atrial fibrillation, hypertension, glaucoma, hypothyroidism, depression.    PT Comments    Pt up in recliner.  Stated he got up with +2 assist of nursing staff.  Stated he thought he did well with transfer but he needed more help than his baseline.  Pt did have a new KI on his leg.  No new x-rays or mention of it noted in chart.  When asked, pt stated that he asked for a brace to help keep his leg straight.  Stated he broke his knee several years ago and he had one but it was "a hundred miles away".  Stated he asked for one just to help with his leg.  Stated he had no new injury but "I think it will help".  Brace removed as it had slid down to his ankle and was not offering any knee support.  Participated in exercises as described below.  No pain or injury noted.  After ex, brace was donned.  He stated he felt that the brace was helping him "Oh yes".  Mobility skills including standing and transfers were offered and encouraged but he declined.   Follow Up Recommendations  SNF     Equipment Recommendations       Recommendations for Other Services       Precautions / Restrictions Precautions Precautions: Other (comment);Fall Precaution Comments: Pt with BLE wounds, scrotal edema and wound. Restrictions Weight Bearing Restrictions: No    Mobility  Bed Mobility Overal bed mobility: Needs Assistance             General bed mobility comments: Pt up in recliner upon arrival  Transfers                 General transfer comment: declined  Ambulation/Gait                 Stairs             Wheelchair  Mobility    Modified Rankin (Stroke Patients Only)       Balance                                            Cognition Arousal/Alertness: Awake/alert Behavior During Therapy: WFL for tasks assessed/performed Overall Cognitive Status: Within Functional Limits for tasks assessed                                        Exercises Other Exercises Other Exercises: Pt with KI on RLE.  KI remoed.  BLE AAROM for ankle pumps, heel slides, SLR, ab/add and quad sets.  Brace returned after session.    General Comments        Pertinent Vitals/Pain Pain Assessment: No/denies pain    Home Living                      Prior Function            PT Goals (current goals  can now be found in the care plan section) Progress towards PT goals: Progressing toward goals    Frequency    Min 2X/week      PT Plan Current plan remains appropriate    Co-evaluation              AM-PAC PT "6 Clicks" Daily Activity  Outcome Measure  Difficulty turning over in bed (including adjusting bedclothes, sheets and blankets)?: Unable Difficulty moving from lying on back to sitting on the side of the bed? : Unable   Help needed moving to and from a bed to chair (including a wheelchair)?: Total Help needed walking in hospital room?: Total Help needed climbing 3-5 steps with a railing? : Total 6 Click Score: 5    End of Session Equipment Utilized During Treatment: Oxygen Activity Tolerance: Patient tolerated treatment well Patient left: in chair;with chair alarm set;with call bell/phone within reach         Time: 3536-1443 PT Time Calculation (min) (ACUTE ONLY): 14 min  Charges:  $Therapeutic Exercise: 8-22 mins                    G Codes:       Chesley Noon, PTA 11/18/17, 11:43 AM

## 2017-11-18 NOTE — Progress Notes (Signed)
New referral for outpatient Palliative to follow at [facility] received from Attending physician Dr. Posey Pronto. CSW Evette Cristal aware. Patient information faxed to referral. Flo Shanks RN, BSN, [facility] [place], hospital liaison [phone]

## 2017-11-18 NOTE — Care Management Important Message (Signed)
Copy of signed IM left with patient in room.  

## 2017-11-18 NOTE — Progress Notes (Signed)
Daily Progress Note   Patient Name: Alan Marshall       Date: 11/18/2017 DOB: [birth year]  Age: 82 y.o. MRN#: [id number] Attending Physician: Fritzi Mandes, MD Primary Care Physician: [facility] Admit Date: 11/15/2017  Reason for Consultation/Follow-up: Establishing goals of care  Subjective: Patient is much more awake and alert on today. He states he feels a lot better. He is alert and oriented x3. Denies any pain. He is out of bed and sitting up in the chair. States he feels better sitting up in the chair and is ready to get back to [facility]. He began telling me how he moved here from [place] and that all of his family is still in [place]. States he hopes his children visits him soon. No family at the bedside, however, I did speak to the daughter over the phone. We reviewed our goals of care conversation on yesterday. She did not have any further questions and continue to request full aggressive treatment for her father. She would also like for him to be followed by outpatient Palliative at discharge.   Chart Reviewed.   Length of Stay: 3  Current Medications: Scheduled Meds:  . allopurinol  50 mg Oral Daily  . amiodarone  100 mg Oral Daily  . aspirin EC  81 mg Oral Daily  . carvedilol  3.125 mg Oral BID WC  . citalopram  40 mg Oral Daily  . dorzolamide  1 drop Both Eyes BID  . doxycycline  100 mg Oral Q12H  . feeding supplement (ENSURE ENLIVE)  237 mL Oral BID BM  . heparin  5,000 Units Subcutaneous Q8H  . latanoprost  1 drop Both Eyes QHS  . levothyroxine  100 mcg Oral QAC breakfast  . pneumococcal 23 valent vaccine  0.5 mL Intramuscular Tomorrow-1000  . polyethylene glycol  17 g Oral Daily  . [START ON 11/19/2017] torsemide  40 mg Oral Daily    Continuous  Infusions:   PRN Meds: acetaminophen **OR** acetaminophen, albuterol, ondansetron **OR** ondansetron (ZOFRAN) IV, traMADol  Physical Exam  Constitutional: He is oriented to person, place, and time. Vital signs are normal. He appears well-developed. He is cooperative.  Cardiovascular: Normal rate, regular rhythm, normal heart sounds, intact distal pulses and normal pulses.  Pulmonary/Chest: Effort normal. He has decreased breath sounds.  Genitourinary:  Right testis shows swelling. Left testis shows swelling.  Musculoskeletal:  Generalized weakness   Neurological: He is alert and oriented to person, place, and time.  Hx of dementia, more awake and alert today   Skin:  Bilateral lower extrem. Discoloration, dressing to left shin area.   Psychiatric: He has a normal mood and affect. His speech is normal and behavior is normal. Cognition and memory are impaired.            Vital Signs: BP 100/77 (BP Location: Left Arm)   Pulse 76   Temp (!) 97.5 F (36.4 C) (Oral)   Resp 16   Ht 6\' 2"  (1.88 m)   Wt 88 kg (194 lb)   SpO2 94%   BMI 24.91 kg/m  SpO2: SpO2: 94 % O2 Device: O2 Device: Nasal Cannula O2 Flow Rate: O2 Flow Rate (L/min): 2 L/min  Intake/output summary:   Intake/Output Summary (Last 24 hours) at 11/18/2017 1128 Last data filed at 11/17/2017 1406 Gross per 24 hour  Intake 240 ml  Output -  Net 240 ml   LBM: Last BM Date: 11/18/17 Baseline Weight: Weight: 91.7 kg (202 lb 2.6 oz) Most recent weight: Weight: 88 kg (194 lb)       Palliative Assessment/Data:PPS 30%     Patient Active Problem List   Diagnosis Date Noted  . CHF (congestive heart failure) ([place]) 11/15/2017    Palliative Care Assessment & Plan   Patient Profile: 82 y.o. male  admitted on 11/15/2017 from [facility] with swelling and shortness of breath. He has a past medical history of atrial flutter, systolic congestive heart failure, chronic kidney disease, hypertension, hypothyroidism, and  glaucoma. Patient is a poor historian and no family was available at bedside. He reported to EDP that he was previously on dialysis but no longer goes. Also that 3 days prior to arrival his water pills had been increased. He reported significant swelling in his lower legs and feeling short of breath for 4-5 days prior to arrival. Shortness of breath caused him to be unable to lay flat. He also had some scrotal swelling. During his ED course he appeared grossly volume overloaded, but also had mild to moderate hypotension. EKG did not demonstrate ischemia but his troponin was minimally elevated likely some mild demand ischemia. Denied chest pain. No fevers or chills. Since admission he has been seen by cardiology and nephrology. Palliative Medicine Team consulted for goals of care.   Recommendations/Plan:  Full code at daughter's request.  Continue to treat the treatable.  Daughter verbalizes her wishes to proceed with current care even if this means aggressive measures such as CPR, intubation, or life-sustaining medications.  Case management aware for outpatient palliative services at discharge or transfer. Santiago Glad, RN with Palliative is aware.   Palliative medicine team will continue to support patient, family, and medical team during hospitalization.  Goals of Care and Additional Recommendations:  Limitations on Scope of Treatment: Full Scope Treatment  Code Status:    Code Status Orders  (From admission, onward)        Start     Ordered   11/15/17 2044  Full code  Continuous     11/15/17 2043    Code Status History    This patient has a current code status but no historical code status.       Prognosis:   Unable to determine guarded to poor in the setting of acute on chronic systolic congestive heart failure with anasarca (EF 20-25%  atrophy), poor attention, atrial fibrillation, depression, stage IV chronic kidney disease, demand ischemia, dementia, poor p.o. intake, moderate  malnutrition, deconditioning, immobility, and repeat hospitalizations.  Discharge Planning:  Per daughter patient is to be discharged back to [facility] with outpatient Palliative Services.   Care plan was discussed with patient's daughter, Santiago Glad, RN [facility] Liaison), and Dr. Posey Pronto.   Thank you for allowing the Palliative Medicine Team to assist in the care of this patient.   Total Time 35 min.  Prolonged Time Billed  NO       Greater than 50%  of this time was spent counseling and coordinating care related to the above assessment and plan.  Jobe Gibbon, NP  Please contact Palliative Medicine Team phone at [phone] for questions and concerns.

## 2017-11-19 LAB — BASIC METABOLIC PANEL
Anion gap: 12 (ref 5–15)
BUN: 74 mg/dL — AB (ref 6–20)
CHLORIDE: 110 mmol/L (ref 101–111)
CO2: 18 mmol/L — ABNORMAL LOW (ref 22–32)
Calcium: 8.6 mg/dL — ABNORMAL LOW (ref 8.9–10.3)
Creatinine, Ser: 2.36 mg/dL — ABNORMAL HIGH (ref 0.61–1.24)
GFR calc Af Amer: 28 mL/min — ABNORMAL LOW (ref >60)
GFR calc non Af Amer: 25 mL/min — ABNORMAL LOW (ref >60)
GLUCOSE: 94 mg/dL (ref 65–99)
POTASSIUM: 3.8 mmol/L (ref 3.5–5.1)
Sodium: 140 mmol/L (ref 135–145)

## 2017-11-19 LAB — CBC
HEMATOCRIT: 31.6 % — AB (ref 40.0–52.0)
Hemoglobin: 10.3 g/dL — ABNORMAL LOW (ref 13.0–18.0)
MCH: 30 pg (ref 26.0–34.0)
MCHC: 32.5 g/dL (ref 32.0–36.0)
MCV: 92.3 fL (ref 80.0–100.0)
Platelets: 171 10*3/uL (ref 150–440)
RBC: 3.43 MIL/uL — ABNORMAL LOW (ref 4.40–5.90)
RDW: 25.6 % — AB (ref 11.5–14.5)
WBC: 5.3 10*3/uL (ref 3.8–10.6)

## 2017-11-19 MED ORDER — FUROSEMIDE 10 MG/ML IJ SOLN
80.0000 mg | Freq: Two times a day (BID) | INTRAMUSCULAR | Status: DC
  Administered 2017-11-19 – 2017-11-20 (×3): 80 mg via INTRAVENOUS
  Filled 2017-11-19 (×3): qty 8

## 2017-11-19 NOTE — Progress Notes (Signed)
Dr. Saunders Revel paged to make aware of low BP 84/60/ pt asymptomatic / states "I feel good"/ no new orders / will monitor closely.

## 2017-11-19 NOTE — Progress Notes (Signed)
Pts BP is low ( 90/66)/ pt asymptomatic/ Dr. END made aware/ orders to administer iv lasix and to hold coreg/ will monitor closely

## 2017-11-19 NOTE — Progress Notes (Signed)
[facility], [place] 11/19/17  Subjective:   Some further improvement in S Creatinine to 2.36 poor appetite Peripheral edema is better Sat in chair day before yesterday Reports that mouth is dry   Objective:  Vital signs in last 24 hours:  Temp:  [97.7 F (36.5 C)-98.8 F (37.1 C)] 98 F (36.7 C) (06/12 0756) Pulse Rate:  [70-71] 70 (06/12 0756) Resp:  [16-18] 18 (06/12 0756) BP: (86-143)/(66-101) 98/72 (06/12 0959) SpO2:  [88 %-100 %] 99 % (06/12 0756) Weight:  [86.7 kg (191 lb 3.2 oz)] 86.7 kg (191 lb 3.2 oz) (06/12 0441)  Weight change: -1.27 kg (-2 lb 12.8 oz) Filed Weights   11/17/17 0339 11/18/17 0347 11/19/17 0441  Weight: 88.4 kg (194 lb 14.4 oz) 88 kg (194 lb) 86.7 kg (191 lb 3.2 oz)    Intake/Output:    Intake/Output Summary (Last 24 hours) at 11/19/2017 1045 Last data filed at 11/18/2017 1327 Gross per 24 hour  Intake -  Output 200 ml  Net -200 ml     Physical Exam: General: NAD, laying in bed  HEENT Anicteric, moist mucus membranes  Neck Supple, + distended neck veins  Pulm/lungs Clear b/l  CVS/Heart irregular  Abdomen:  Scrotal enlargement  Extremities: + dependent edema on thighs,  Skin breakdown on shins  Neurologic: Alert, oreinted             Basic Metabolic Panel:  Recent Labs  Lab 11/15/17 1749 11/16/17 0421 11/17/17 0424 11/18/17 0806 11/19/17 0802  NA 138 141 142 139 140  K 4.3 3.5 3.4* 3.7 3.8  CL 106 108 110 107 110  CO2 18* 19* 20* 19* 18*  GLUCOSE 108* 77 70 98 94  BUN 77* 74* 71* 70* 74*  CREATININE 2.67* 2.60* 2.50* 2.38* 2.36*  CALCIUM 8.9 8.5* 8.4* 8.5* 8.6*  PHOS  --   --  3.7  --   --      CBC: Recent Labs  Lab 11/15/17 1749 11/16/17 0421 11/18/17 0806 11/19/17 0802  WBC 6.6 6.0 6.1 5.3  HGB 11.0* 10.5* 10.4* 10.3*  HCT 34.3* 32.3* 32.3* 31.6*  MCV 91.7 91.6 91.9 92.3  PLT 167 149* 167 171     No results found for: HEPBSAG, HEPBSAB, HEPBIGM    Microbiology:  Recent  Results (from the past 240 hour(s))  MRSA PCR Screening     Status: None   Collection Time: 11/15/17 10:03 PM  Result Value Ref Range Status   MRSA by PCR NEGATIVE NEGATIVE Final    Comment:        The GeneXpert MRSA Assay (FDA approved for NASAL specimens only), is one component of a comprehensive MRSA colonization surveillance program. It is not intended to diagnose MRSA infection nor to guide or monitor treatment for MRSA infections. Performed at [facility], [address], [place], [place] [address]   Urine Culture     Status: Abnormal   Collection Time: 11/15/17 11:36 PM  Result Value Ref Range Status   Specimen Description   Final    URINE, RANDOM Performed at [facility], [address] [place], [place] [address]    Special Requests   Final    Normal Performed at [facility], [place], [place] [address]    Culture 80,000 COLONIES/mL ESCHERICHIA COLI (A)  Final   Report Status 11/18/2017 FINAL  Final   Organism ID, Bacteria ESCHERICHIA COLI (A)  Final      Susceptibility  Escherichia coli - MIC*    AMPICILLIN >=32 RESISTANT Resistant     CEFAZOLIN 32 INTERMEDIATE Intermediate     CEFTRIAXONE <=1 SENSITIVE Sensitive     CIPROFLOXACIN >=4 RESISTANT Resistant     GENTAMICIN <=1 SENSITIVE Sensitive     IMIPENEM 0.5 SENSITIVE Sensitive     NITROFURANTOIN 32 SENSITIVE Sensitive     TRIMETH/SULFA <=20 SENSITIVE Sensitive     AMPICILLIN/SULBACTAM 8 SENSITIVE Sensitive     PIP/TAZO <=4 SENSITIVE Sensitive     Extended ESBL NEGATIVE Sensitive     * 80,000 COLONIES/mL ESCHERICHIA COLI  Aerobic/Anaerobic Culture (surgical/deep wound)     Status: None (Preliminary result)   Collection Time: 11/17/17 10:36 PM  Result Value Ref Range Status   Specimen Description   Final    KNEE Performed at [facility], [address], [place], [place] [address]    Special Requests   Final    Normal Performed at [facility], [address] [place], [place] [address]    Gram Stain   Final    RARE WBC PRESENT, PREDOMINANTLY MONONUCLEAR NO ORGANISMS SEEN Performed at [facility], [place] [address], [place], [place] [address]    Culture PENDING  Incomplete   Report Status PENDING  Incomplete    Coagulation Studies: No results for input(s): LABPROT, INR in the last 72 hours.  Urinalysis: No results for input(s): COLORURINE, LABSPEC, PHURINE, GLUCOSEU, HGBUR, BILIRUBINUR, KETONESUR, PROTEINUR, UROBILINOGEN, NITRITE, LEUKOCYTESUR in the last 72 hours.  Invalid input(s): APPERANCEUR    Imaging: No results found.   Medications:    . allopurinol  50 mg Oral Daily  . amiodarone  100 mg Oral Daily  . aspirin EC  81 mg Oral Daily  . carvedilol  3.125 mg Oral BID WC  . citalopram  40 mg Oral Daily  . dorzolamide  1 drop Both Eyes BID  . doxycycline  100 mg Oral Q12H  . feeding supplement (ENSURE ENLIVE)  237 mL Oral BID BM  . furosemide  80 mg Intravenous BID  . heparin  5,000 Units Subcutaneous Q8H  . latanoprost  1 drop Both Eyes QHS  . levothyroxine  100 mcg Oral QAC breakfast  . pneumococcal 23 valent vaccine  0.5 mL Intramuscular Tomorrow-1000  . polyethylene glycol  17 g Oral Daily   acetaminophen **OR** acetaminophen, albuterol, ondansetron **OR** ondansetron (ZOFRAN) IV, traMADol  Assessment/ Plan:  82 y.o. black male with systolic congestive heart failure, atrial fibrillation, hypertension, glaucoma, hypothyroidism, depression, who was admitted to [facility] on 11/15/2017 for acute exacerbation of systolic congestive heart failure.   1. Acute renal failure on chronic kidney disease stage III: baseline creatinine of 2.2, GFR of 36 from August 2015. No labs since then.  Urinalysis bland Chronic kidney disease most likely secondary to hypertension Acute injury from acute cardiorenal syndrome.  - Home regimen without an ACE-I/ARB - renal ultrasound- Diffuse- medical renal  disease - S Creatinine 2.36 today  2. acute exacerbation of systolic congestive heart failure. Echo with EF of 20-25%.  Changed back to iv lasix by cardiology team Continue to monitor electrolytes and S Creatinine. If S Creatinine increases, consider midodrine to increase SBP  3. Anemia with chronic kidney disease: hemoglobin 10.5.   monitor      LOS: [place] 6/12/201910:45 AM  [place], [place]  Note: This note was prepared with Dragon dictation. Any transcription errors are unintentional

## 2017-11-19 NOTE — Plan of Care (Signed)
  Problem: Education: Goal: Knowledge of General Education information will improve Outcome: Progressing   Problem: Health Behavior/Discharge Planning: Goal: Ability to manage health-related needs will improve Outcome: Progressing   Problem: Clinical Measurements: Goal: Ability to maintain clinical measurements within normal limits will improve Outcome: Progressing Goal: Will remain free from infection Outcome: Progressing Goal: Diagnostic test results will improve Outcome: Progressing Goal: Respiratory complications will improve Outcome: Progressing Goal: Cardiovascular complication will be avoided Outcome: Progressing   Problem: Activity: Goal: Risk for activity intolerance will decrease Outcome: Progressing   Problem: Nutrition: Goal: Adequate nutrition will be maintained Outcome: Progressing   Problem: Coping: Goal: Level of anxiety will decrease Outcome: Progressing   Problem: Elimination: Goal: Will not experience complications related to bowel motility Outcome: Progressing Goal: Will not experience complications related to urinary retention Outcome: Progressing   Problem: Pain Managment: Goal: General experience of comfort will improve Outcome: Progressing   Problem: Safety: Goal: Ability to remain free from injury will improve Outcome: Progressing   Problem: Skin Integrity: Goal: Risk for impaired skin integrity will decrease Outcome: Progressing   Problem: Education: Goal: Ability to demonstrate management of disease process will improve Outcome: Progressing Goal: Ability to verbalize understanding of medication therapies will improve Outcome: Progressing   Problem: Activity: Goal: Capacity to carry out activities will improve Outcome: Progressing   Problem: Cardiac: Goal: Ability to achieve and maintain adequate cardiopulmonary perfusion will improve Outcome: Progressing

## 2017-11-19 NOTE — Progress Notes (Signed)
Progress Note  Patient Name: Alan Marshall Date of Encounter: 11/19/2017  Primary Cardiologist: [facility]  Subjective   SOB persists. Very weak. No chest pain. Has not ambulated. Lower extremity swelling persists. Was changed from IV Lasix 80 mg bid to po torsemid 40 mg daily on 6/11 by nephrology. No labs this morning. BP soft. Remains in Afib/flutter with controlled ventricular response.   Inpatient Medications    Scheduled Meds: . allopurinol  50 mg Oral Daily  . amiodarone  100 mg Oral Daily  . aspirin EC  81 mg Oral Daily  . carvedilol  3.125 mg Oral BID WC  . citalopram  40 mg Oral Daily  . dorzolamide  1 drop Both Eyes BID  . doxycycline  100 mg Oral Q12H  . feeding supplement (ENSURE ENLIVE)  237 mL Oral BID BM  . heparin  5,000 Units Subcutaneous Q8H  . latanoprost  1 drop Both Eyes QHS  . levothyroxine  100 mcg Oral QAC breakfast  . pneumococcal 23 valent vaccine  0.5 mL Intramuscular Tomorrow-1000  . polyethylene glycol  17 g Oral Daily  . torsemide  40 mg Oral Daily   Continuous Infusions:  PRN Meds: acetaminophen **OR** acetaminophen, albuterol, ondansetron **OR** ondansetron (ZOFRAN) IV, traMADol   Vital Signs    Vitals:   11/18/17 1622 11/18/17 1937 11/19/17 0407 11/19/17 0441  BP: (!) 143/101 98/72 (!) 86/70   Pulse: 71 70 71   Resp: 18 16 18    Temp:  98.8 F (37.1 C) 97.7 F (36.5 C)   TempSrc:   Oral   SpO2: 100% 91% 96%   Weight:    191 lb 3.2 oz (86.7 kg)  Height:        Intake/Output Summary (Last 24 hours) at 11/19/2017 0739 Last data filed at 11/18/2017 1327 Gross per 24 hour  Intake -  Output 200 ml  Net -200 ml   Filed Weights   11/17/17 0339 11/18/17 0347 11/19/17 0441  Weight: 194 lb 14.4 oz (88.4 kg) 194 lb (88 kg) 191 lb 3.2 oz (86.7 kg)    Telemetry    Afib/flutter - Personally Reviewed  ECG    n/a - Personally Reviewed  Physical Exam   GEN: No acute distress.   Neck: No JVD. Cardiac: RRR, II/VI systolic murmur at  the apex, no rubs, or gallops.  Respiratory: Diminished breath sounds bilaterally.  GI: Soft, nontender, non-distended.   MS: 2-3+ lower extremity edema to the thighs; No deformity. Neuro:  Alert and oriented x 3; Nonfocal.  Psych: Somnolent; Normal affect.  Labs    Chemistry Recent Labs  Lab 11/13/17 1215 11/15/17 1749 11/16/17 0421 11/17/17 0424 11/18/17 0806  NA 138 138 141 142 139  K 4.0 4.3 3.5 3.4* 3.7  CL 105 106 108 110 107  CO2 17* 18* 19* 20* 19*  GLUCOSE 92 108* 77 70 98  BUN 73* 77* 74* 71* 70*  CREATININE 2.78* 2.67* 2.60* 2.50* 2.38*  CALCIUM 8.4* 8.9 8.5* 8.4* 8.5*  PROT 7.9 7.6  --   --   --   ALBUMIN 3.3* 3.3*  --  3.0*  --   AST 26 26  --   --   --   ALT 10* 10*  --   --   --   ALKPHOS 78 78  --   --   --   BILITOT 1.3* 1.2  --   --   --   GFRNONAA 20* 21* 22* 23* 24*  GFRAA 23* 25* 25* 27* 28*  ANIONGAP 16* 14 14 12 13      Hematology Recent Labs  Lab 11/15/17 1749 11/16/17 0421 11/18/17 0806  WBC 6.6 6.0 6.1  RBC 3.75* 3.52* 3.51*  HGB 11.0* 10.5* 10.4*  HCT 34.3* 32.3* 32.3*  MCV 91.7 91.6 91.9  MCH 29.5 29.7 29.7  MCHC 32.2 32.4 32.3  RDW 23.2* 24.2* 24.6*  PLT 167 149* 167    Cardiac Enzymes Recent Labs  Lab 11/15/17 1749  TROPONINI 0.11*   No results for input(s): TROPIPOC in the last 168 hours.   BNP Recent Labs  Lab 11/15/17 1749  BNP 2,886.0*     DDimer No results for input(s): DDIMER in the last 168 hours.   Radiology    No results found.  Cardiac Studies   TTE 11/16/2017: Study Conclusions  - Left ventricle: The cavity size was mildly dilated. Systolic function was severely reduced. The estimated ejection fraction was <20%. Diffuse hypokinesis. Regional wall motion abnormalities cannot be excluded. The study is not technically sufficient to allow evaluation of LV diastolic function. - Aortic valve: There was mild to moderate regurgitation. - Aorta: Ascending aortic diameter: 45 mm (S). - Ascending  aorta: The ascending aorta was moderately dilated. - Mitral valve: There was moderate regurgitation. - Left atrium: The atrium was moderately dilated. - Right ventricle: The cavity size was moderately dilated. Wall thickness was normal. Systolic function was moderately reduced. - Right atrium: The atrium was moderately dilated. - Pulmonary arteries: Systolic pressure was moderate to severely elevated. PA peak pressure: 60 mm Hg (S). - Pericardium, extracardiac: Moderate to large sized circumferential pericardial effusion was present but no effusive-constrictive changes were noted. Features were not consistent with tamponade physiology.  Impressions:  - Rhythm is atrial flutter.   Patient Profile     82 y.o. male with history of chronic systolic CHF due to NICM with prior ICD s/p extraction for infection in 2015 secondary to MSSA bacteremia, PAF not on anticoagulation, CKD stage IV, DM2, and dementia who was admitted to [facility] with volume overload.  Assessment & Plan    1. Acute on chronic systolic CHF due to NICM: -Status post prior ICD with extraction in 2015 for MSSA bacteremia  -He continues to be significantly volume up -Recommend changing diuretic back to IV Lasix 80 mg bid, may even need Lasix gtt +/- milrinone as his daughter continues to want aggressive care -I do not think he is adequately diuresed and is not ready for discharge to facility  -Tolerating low-dose Coreg -Not on evidence-based heart failure therapy secondary to hypotension, start when/if able -If his hypotension/renal function preclude diuresis, he may require transfer to the ICU for milrinone (if family would like, need their input as below) -TTE as above -Daily weights -Strict Is and Os  2. Afib/flutter: -Uncertain chronicity  -Has a history of PAF -Ventricular rate is well controlled, not on rate control given hypotension  -Not on anticoagulation given high bleeding risk and dementia -May  have converted to sinus rhtyhm -Continue amiodarone and Coreg  3. Acute on CKD stage IV: -No labs this morning -Monitor  4. Anemia of chronic disease: -No labs this morning -Stable  5. Elevated troponin: -No symptoms of chest pain -Not a candidate for invasive procedure given dementia and comorbid conditions  -Not on heparin gtt  6. Dementia: -Per IM  7. Abnormal TSH: -Recommend checking free T4 and total T3 -Defer to IM  8. Hypokalemia: -No labs this morning -Recommend gentle  repletion to goal  9. Dispo: -Agree with Palliative care consult -Overall, poor prognosis -Daughter continues to want aggressive care and full code    For questions or updates, please contact [place] Please consult [email or web address] for contact info under Cardiology/STEMI.    Signed, Christell Faith, PA-C [place] Pager: [phone] 11/19/2017, 7:39 AM

## 2017-11-19 NOTE — Progress Notes (Signed)
[name]    MR#:  [id number]  DATE OF BIRTH:  07-27-1935  SUBJECTIVE:   Breathing improved. Patient states he wants to try to go to the recliner today.  Denies any scrotal pain. REVIEW OF SYSTEMS:   Review of Systems  Constitutional: Negative for chills, fever and weight loss.  HENT: Negative for ear discharge, ear pain and nosebleeds.   Eyes: Negative for blurred vision, pain and discharge.  Respiratory: Positive for shortness of breath. Negative for sputum production, wheezing and stridor.   Cardiovascular: Positive for leg swelling and PND. Negative for chest pain, palpitations and orthopnea.  Gastrointestinal: Negative for abdominal pain, diarrhea, nausea and vomiting.  Genitourinary: Negative for frequency and urgency.  Musculoskeletal: Negative for back pain and joint pain.  Neurological: Negative for sensory change, speech change, focal weakness and weakness.  Psychiatric/Behavioral: Negative for depression and hallucinations. The patient is not nervous/anxious.    Tolerating Diet:yes Tolerating PT: patient does not walk  DRUG ALLERGIES:  No Known Allergies  VITALS:  Blood pressure (!) 84/60, pulse 70, temperature 98 F (36.7 C), temperature source Oral, resp. rate 18, height 6\' 2"  (1.88 m), weight 86.7 kg (191 lb 3.2 oz), SpO2 99 %.  PHYSICAL EXAMINATION:   Physical Exam  GENERAL:  82 y.o.-year-old patient lying in the bed with no acute distress. Appears chronically ill generalized and [place] EYES: Pupils equal, round, reactive to light and accommodation. No scleral icterus. Extraocular muscles intact.  HEENT: Head atraumatic, normocephalic. Oropharynx and nasopharynx clear.  NECK:  Supple, no jugular venous distention. No thyroid enlargement, no tenderness. JVD+ LUNGS: decreased breath sounds bilaterally, no wheezing, rales, rhonchi. No use of accessory muscles of respiration.   CARDIOVASCULAR: S1, S2 normal. No murmurs, rubs, or gallops.  ABDOMEN: Soft, nontender, nondistended. Bowel sounds present. No organomegaly or mass. Scrotal swelling significant amount right side more than left EXTREMITIES:++ edema both lower extremities, right knee brace+ NEUROLOGIC: unable to assess. Patient moves all extremities well  PSYCHIATRIC:  patient is alert but confused  SKIN: No obvious rash, lesion, or ulcer.   LABORATORY PANEL:  CBC Recent Labs  Lab 11/19/17 0802  WBC 5.3  HGB 10.3*  HCT 31.6*  PLT 171    Chemistries  Recent Labs  Lab 11/15/17 1749  11/19/17 0802  NA 138   < > 140  K 4.3   < > 3.8  CL 106   < > 110  CO2 18*   < > 18*  GLUCOSE 108*   < > 94  BUN 77*   < > 74*  CREATININE 2.67*   < > 2.36*  CALCIUM 8.9   < > 8.6*  AST 26  --   --   ALT 10*  --   --   ALKPHOS 78  --   --   BILITOT 1.2  --   --    < > = values in this interval not displayed.   Cardiac Enzymes Recent Labs  Lab 11/15/17 1749  TROPONINI 0.11*   RADIOLOGY:  No results found. ASSESSMENT AND PLAN:  Alan Marshall  is a 82 y.o. male with a known history of systolic congestive heart failure presents with shortness of breath and swelling.  The patient is a very poor historian.  He states that he lived in [place] and now is in a rest home here  1.  Acute on chronic systolic congestive heart failure with anasarca.   -  echo shows EF of 20 to 25% with severe hypo kinesis -cardiology consultation appreciated - Lasix 80 mg IV twice daily. ---Diuresis well. Was Changed to torsemide 40 mg daily--now back on IV lasix 80 mg bid per Dr END -patient had ICD placement in the past which was removed secondary to MSSA sepsis -echo shows pericardial effusion moderate amount without signs of tamponade -epiric doxycycline for ?respiraotry infection although favors CHF more  2. Relative hypotension limited with any other medications for blood pressure and CHF at this point. -Start low-dose  Coreg -consider adding spironolactone  3. Atrial fibrillation on amiodarone and aspirin  4.Hypothyroidism unspecified on levothyroxine -TSH elevated 8.8 increase Synthroid  6.Depression pt was  on Zoloft.  - will switch this over to Celexa with his fluid weight gain  7. Chronic kidney disease stage IV.  Monitor with diuresis patient has chronic kidney disease creatinine is around 2 to 2.5. -appreciate neurology input  8.Elevated troponin likely demand ischemia  9.Moderate malnutrition dietitian to see  10. Scrotal swelling due to anasarca - scrotal ultrasound showed fluid retention is found. Noted some interest times herniated into the scrotum. No tenderness. - no signs of strangulation noted  Overall patient care has  Poor long term prognosis.  Spoke with dter Joanie Coddington over the phone.  Case discussed with Care Management/Social Worker. Management plans discussed with the patient, family and they are in agreement.  CODE STATUS: full  DVT Prophylaxis: heparin  TOTAL TIME TAKING CARE OF THIS PATIENT: *30* minutes.  >50% time spent on counselling and coordination of care  POSSIBLE D/C IN few DAYS, DEPENDING ON CLINICAL CONDITION.  Note: This dictation was prepared with Dragon dictation along with smaller phrase technology. Any transcriptional errors that result from this process are unintentional.  Fritzi Mandes M.D on 11/19/2017 at 2:44 PM  Between 7am to 6pm - Pager - [phone]  After 6pm go to [email or web address] - password [email or web address] [facility]  Office  [phone]  [name]; [facility]: [facility], male   DOB: 04-Aug-1935, 82 y.o.   MRN: [id number]

## 2017-11-19 NOTE — Progress Notes (Signed)
Daily Progress Note   Patient Name: Alan Marshall       Date: 11/19/2017 DOB: 01/25/1936  Age: 82 y.o. MRN#: [id number] Attending Physician: Fritzi Mandes, MD Primary Care Physician: [facility] Admit Date: 11/15/2017  Reason for Consultation/Follow-up: Establishing goals of care  Subjective: Patient is much more awake and alert on today. He states he feels a lot better. He is alert and oriented x3. Denies any pain. States he does have some discomfort at times to his right knee. States he has to get used to not applying pressure to it and having the brace on. He is out of bed and sitting up in the chair. Reports talking to his daughter, Alan Marshall earlier and that he is aware of the plan to eventually go back to the facility. Given patients improvement in mental status, I attempted to discuss and see if Alan Marshall would provide appropriate insight on his health situation and code status. During discussion he reported he had 3 children all in [place], however Alan Marshall was the Copy. He states without my initiation of code discussion, "and she knows my wishes to get the best care possible and to do everything!" When asked could he elaborate on what "everything" means to him he stated "I want to have all the care to give me a fighting chance, I want CPR and I am ok to be on life-support until somebody tells my family otherwise that I am not going to live." Patients request is similar to what his daughter stated to me and what he has said to her and her siblings in the past. He was able to tell me that he was once a hospice patient but is not under their services any longer.   I left voicemail to follow-up with daughter.   Chart Reviewed.   Length of Stay: 4  Current  Medications: Scheduled Meds:  . allopurinol  50 mg Oral Daily  . amiodarone  100 mg Oral Daily  . aspirin EC  81 mg Oral Daily  . carvedilol  3.125 mg Oral BID WC  . citalopram  40 mg Oral Daily  . dorzolamide  1 drop Both Eyes BID  . doxycycline  100 mg Oral Q12H  . feeding supplement (ENSURE ENLIVE)  237 mL Oral BID BM  . furosemide  80 mg Intravenous BID  . heparin  5,000 Units Subcutaneous Q8H  . latanoprost  1 drop Both Eyes QHS  . levothyroxine  100 mcg Oral QAC breakfast  . pneumococcal 23 valent vaccine  0.5 mL Intramuscular Tomorrow-1000  . polyethylene glycol  17 g Oral Daily    Continuous Infusions:   PRN Meds: acetaminophen **OR** acetaminophen, albuterol, ondansetron **OR** ondansetron (ZOFRAN) IV, traMADol  Physical Exam  Constitutional: He is oriented to person, place, and time. Vital signs are normal. He appears well-developed. He is cooperative.  Cardiovascular: Normal rate, regular rhythm, normal heart sounds, intact distal pulses and normal pulses.  Pulmonary/Chest: Effort normal. He has decreased breath sounds.  Genitourinary: Right testis shows swelling. Left testis shows swelling.  Musculoskeletal:  Generalized weakness , right knee brace    Neurological: He is alert and oriented to person, place, and time.  Hx of dementia, more awake and alert today   Skin:  Bilateral lower extrem. Discoloration, dressing to left shin area.   Psychiatric: He has a normal mood and affect. His speech is normal and behavior is normal. Cognition and memory are impaired.  Patient is alert and oriented with hx of dementia             Vital Signs: BP 93/75   Pulse 70   Temp 98 F (36.7 C) (Oral)   Resp 18   Ht 6\' 2"  (1.88 m)   Wt 86.7 kg (191 lb 3.2 oz)   SpO2 99%   BMI 24.55 kg/m  SpO2: SpO2: 99 % O2 Device: O2 Device: Nasal Cannula O2 Flow Rate: O2 Flow Rate (L/min): 3 L/min  Intake/output summary:  No intake or output data in the 24 hours ending 11/19/17  1328 LBM: Last BM Date: 11/18/17 Baseline Weight: Weight: 91.7 kg (202 lb 2.6 oz) Most recent weight: Weight: 86.7 kg (191 lb 3.2 oz)       Palliative Assessment/Data:PPS 30%     Patient Active Problem List   Diagnosis Date Noted  . CHF (congestive heart failure) ([place]) 11/15/2017    Palliative Care Assessment & Plan   Patient Profile: 82 y.o. male  admitted on 11/15/2017 from [facility] [place] with swelling and shortness of breath. He has a past medical history of atrial flutter, systolic congestive heart failure, chronic kidney disease, hypertension, hypothyroidism, and glaucoma. Patient is a poor historian and no family was available at bedside. He reported to EDP that he was previously on dialysis but no longer goes. Also that 3 days prior to arrival his water pills had been increased. He reported significant swelling in his lower legs and feeling short of breath for 4-5 days prior to arrival. Shortness of breath caused him to be unable to lay flat. He also had some scrotal swelling. During his ED course he appeared grossly volume overloaded, but also had mild to moderate hypotension. EKG did not demonstrate ischemia but his troponin was minimally elevated likely some mild demand ischemia. Denied chest pain. No fevers or chills. Since admission he has been seen by cardiology and nephrology. Palliative Medicine Team consulted for goals of care.   Recommendations/Plan:  Full code at daughter's request.  Continue to treat the treatable.  Daughter verbalizes her wishes to proceed with current care even if this means aggressive measures such as CPR, intubation, or life-sustaining medications.  Case management aware for outpatient palliative services at discharge or transfer. Santiago Glad, RN with Palliative is aware.   Palliative medicine team will continue to support patient, family,  and medical team during hospitalization.  Goals of Care and Additional Recommendations:  Limitations on Scope  of Treatment: Full Scope Treatment  Code Status:    Code Status Orders  (From admission, onward)        Start     Ordered   11/15/17 2044  Full code  Continuous     11/15/17 2043    Code Status History    This patient has a current code status but no historical code status.       Prognosis:   Unable to determine guarded to poor in the setting of acute on chronic systolic congestive heart failure with anasarca (EF 20-25% atrophy), poor attention, atrial fibrillation, depression, stage IV chronic kidney disease, demand ischemia, dementia, poor p.o. intake, moderate malnutrition, deconditioning, immobility, and repeat hospitalizations.  Discharge Planning:  Per daughter patient is to be discharged back to [facility] with outpatient Palliative Services.   Care plan was discussed with patient's daughter, Santiago Glad, RN [facility] Liaison), and Dr. Posey Pronto.   Thank you for allowing the Palliative Medicine Team to assist in the care of this patient.   Total Time 35 min.  Prolonged Time Billed  NO       Greater than 50%  of this time was spent counseling and coordinating care related to the above assessment and plan.  Alda Lea, NP-BC Palliative Medicine Team  Phone: [phone] Fax: [phone]  Please contact Palliative Medicine Team phone at [phone] for questions and concerns.

## 2017-11-19 NOTE — Clinical Social Work Note (Signed)
CSW continuing to follow patient's progress, plan is to return to [facility] where he is a long term care resident and to have palliative follow.  Jones Broom. Norval Morton, MSW, [place]  11/19/2017 7:38 PM

## 2017-11-20 MED ORDER — METOLAZONE 2.5 MG PO TABS: 2.5000 mg | ORAL_TABLET | Freq: Every day | ORAL | 0 refills | Status: DC | PRN

## 2017-11-20 MED ORDER — TORSEMIDE 20 MG PO TABS: 40.0000 mg | ORAL_TABLET | Freq: Two times a day (BID) | ORAL | 1 refills | Status: AC

## 2017-11-20 MED ORDER — CITALOPRAM HYDROBROMIDE 40 MG PO TABS: 40.0000 mg | ORAL_TABLET | Freq: Every day | ORAL | 1 refills | Status: DC

## 2017-11-20 MED ORDER — TORSEMIDE 20 MG PO TABS: 20.0000 mg | ORAL_TABLET | Freq: Two times a day (BID) | ORAL | Status: DC

## 2017-11-20 MED ORDER — ALLOPURINOL 100 MG PO TABS: 50.0000 mg | ORAL_TABLET | Freq: Every day | ORAL | 0 refills | Status: AC

## 2017-11-20 MED ORDER — METOLAZONE 2.5 MG PO TABS
2.5000 mg | ORAL_TABLET | Freq: Every day | ORAL | Status: DC | PRN
  Filled 2017-11-20: qty 1

## 2017-11-20 MED ORDER — ENSURE ENLIVE PO LIQD: 237.0000 mL | Freq: Two times a day (BID) | ORAL | 12 refills | Status: DC

## 2017-11-20 MED ORDER — CARVEDILOL 3.125 MG PO TABS
3.1250 mg | ORAL_TABLET | Freq: Two times a day (BID) | ORAL | 1 refills | Status: AC
Start: 2017-11-20 — End: ?

## 2017-11-20 NOTE — Progress Notes (Signed)
Progress Note  Patient Name: Alan Marshall Date of Encounter: 11/20/2017  Primary Cardiologist: Rob Hickman  Subjective   The patient reports improvement in shortness of breath and leg edema.  He feels better overall.  Inpatient Medications    Scheduled Meds: . allopurinol  50 mg Oral Daily  . amiodarone  100 mg Oral Daily  . aspirin EC  81 mg Oral Daily  . carvedilol  3.125 mg Oral BID WC  . citalopram  40 mg Oral Daily  . dorzolamide  1 drop Both Eyes BID  . doxycycline  100 mg Oral Q12H  . feeding supplement (ENSURE ENLIVE)  237 mL Oral BID BM  . furosemide  80 mg Intravenous BID  . heparin  5,000 Units Subcutaneous Q8H  . latanoprost  1 drop Both Eyes QHS  . levothyroxine  100 mcg Oral QAC breakfast  . pneumococcal 23 valent vaccine  0.5 mL Intramuscular Tomorrow-1000  . polyethylene glycol  17 g Oral Daily   Continuous Infusions:  PRN Meds: acetaminophen **OR** acetaminophen, albuterol, ondansetron **OR** ondansetron (ZOFRAN) IV, traMADol   Vital Signs    Vitals:   11/19/17 2120 11/20/17 0417 11/20/17 0418 11/20/17 0754  BP: 100/78 92/71  94/71  Pulse: 85 85  85  Resp: 18 18  18   Temp: 98 F (36.7 C) 97.9 F (36.6 C)  98.3 F (36.8 C)  TempSrc: Oral Oral    SpO2: 97% 100%  97%  Weight:   196 lb 8 oz (89.1 kg)   Height:       No intake or output data in the 24 hours ending 11/20/17 0835 Filed Weights   11/18/17 0347 11/19/17 0441 11/20/17 0418  Weight: 194 lb (88 kg) 191 lb 3.2 oz (86.7 kg) 196 lb 8 oz (89.1 kg)    Telemetry    Afib/flutter - Personally Reviewed  ECG    n/a - Personally Reviewed  Physical Exam   GEN: No acute distress.   Neck:  Mild JVD. Cardiac: RRR, II/VI systolic murmur at the apex, no rubs, or gallops.  Respiratory: Diminished breath sounds bilaterally.  GI: Soft, nontender, non-distended.   MS:  Mild lower extremity edema; No deformity.  Significant scrotal edema Neuro:  Alert and oriented x 3; Nonfocal.  Psych:  Somnolent; Normal affect.  Labs    Chemistry Recent Labs  Lab 11/13/17 1215 11/15/17 1749  11/17/17 0424 11/18/17 0806 11/19/17 0802  NA 138 138   < > 142 139 140  K 4.0 4.3   < > 3.4* 3.7 3.8  CL 105 106   < > 110 107 110  CO2 17* 18*   < > 20* 19* 18*  GLUCOSE 92 108*   < > 70 98 94  BUN 73* 77*   < > 71* 70* 74*  CREATININE 2.78* 2.67*   < > 2.50* 2.38* 2.36*  CALCIUM 8.4* 8.9   < > 8.4* 8.5* 8.6*  PROT 7.9 7.6  --   --   --   --   ALBUMIN 3.3* 3.3*  --  3.0*  --   --   AST 26 26  --   --   --   --   ALT 10* 10*  --   --   --   --   ALKPHOS 78 78  --   --   --   --   BILITOT 1.3* 1.2  --   --   --   --   GFRNONAA 20*  21*   < > 23* 24* 25*  GFRAA 23* 25*   < > 27* 28* 28*  ANIONGAP 16* 14   < > 12 13 12    < > = values in this interval not displayed.     Hematology Recent Labs  Lab 11/16/17 0421 11/18/17 0806 11/19/17 0802  WBC 6.0 6.1 5.3  RBC 3.52* 3.51* 3.43*  HGB 10.5* 10.4* 10.3*  HCT 32.3* 32.3* 31.6*  MCV 91.6 91.9 92.3  MCH 29.7 29.7 30.0  MCHC 32.4 32.3 32.5  RDW 24.2* 24.6* 25.6*  PLT 149* 167 171    Cardiac Enzymes Recent Labs  Lab 11/15/17 1749  TROPONINI 0.11*   No results for input(s): TROPIPOC in the last 168 hours.   BNP Recent Labs  Lab 11/15/17 1749  BNP 2,886.0*     DDimer No results for input(s): DDIMER in the last 168 hours.   Radiology    No results found.  Cardiac Studies   TTE 11/16/2017: Study Conclusions  - Left ventricle: The cavity size was mildly dilated. Systolic function was severely reduced. The estimated ejection fraction was <20%. Diffuse hypokinesis. Regional wall motion abnormalities cannot be excluded. The study is not technically sufficient to allow evaluation of LV diastolic function. - Aortic valve: There was mild to moderate regurgitation. - Aorta: Ascending aortic diameter: 45 mm (S). - Ascending aorta: The ascending aorta was moderately dilated. - Mitral valve: There was moderate  regurgitation. - Left atrium: The atrium was moderately dilated. - Right ventricle: The cavity size was moderately dilated. Wall thickness was normal. Systolic function was moderately reduced. - Right atrium: The atrium was moderately dilated. - Pulmonary arteries: Systolic pressure was moderate to severely elevated. PA peak pressure: 60 mm Hg (S). - Pericardium, extracardiac: Moderate to large sized circumferential pericardial effusion was present but no effusive-constrictive changes were noted. Features were not consistent with tamponade physiology.  Impressions:  - Rhythm is atrial flutter.   Patient Profile     82 y.o. male with history of chronic systolic CHF due to NICM with prior ICD s/p extraction for infection in 2015 secondary to MSSA bacteremia, PAF not on anticoagulation, CKD stage IV, DM2, and dementia who was admitted to [facility] with volume overload.  Assessment & Plan    1. Acute on chronic systolic CHF due to NICM: -Status post prior ICD with extraction in 2015 for MSSA bacteremia  Difficult to determine his accurate urine output given that he is incontinent. I have not seen him since Monday but to my exam his volume overload has improved with significantly less leg edema and scrotal edema.  He continues to be mildly volume overloaded but we might be able to switch back to oral torsemide 40 mg twice daily.  Recommend using metolazone 5 mg daily as needed for weight gain more than 3 pounds in 48 hours.  -Tolerating low-dose Coreg. Not able to add other heart failure medications due to hypotension and advanced chronic kidney disease.  His overall prognosis is poor.   2. Afib/flutter: -Uncertain chronicity  -Has a history of PAF -Ventricular rate is well controlled, not on rate control given hypotension  -Not on anticoagulation given high bleeding risk and dementia -Continue amiodarone and Coreg  3. Acute on CKD stage IV: Stable renal  function.  4. Anemia of chronic disease: -No labs this morning -Stable  5. Elevated troponin: -No symptoms of chest pain -Not a candidate for invasive procedure given dementia and comorbid conditions  -Not on heparin gtt  For questions or updates, please contact [place] Please consult [email or web address] for contact info under Cardiology/STEMI.    Signed, Kathlyn Sacramento, MD [facility] 11/20/2017, 8:35 AM

## 2017-11-20 NOTE — Discharge Summary (Addendum)
[facility] [place] NAME: Jamarco Zaldivar    MR#:  [id number]  DATE OF BIRTH:  1936/02/25  DATE OF ADMISSION:  11/15/2017 ADMITTING PHYSICIAN: Loletha Grayer, MD  DATE OF DISCHARGE: 11/20/2017  PRIMARY CARE PHYSICIAN: [name]    ADMISSION DIAGNOSIS:  Shortness of breath [R06.02] Elevated troponin [R74.8] Hypervolemia, unspecified hypervolemia type [E87.70]  DISCHARGE DIAGNOSIS:  acute on chronic systolic congestive heart failure with and Asarca atrial fibrillation hypothyroidism SECONDARY DIAGNOSIS:   Past Medical History:  Diagnosis Date  . Anemia   . Atrial flutter ([place])   . CHF (congestive heart failure) ([place])   . Chronic kidney disease   . Glaucoma   . Hypertension   . Hypothyroidism   . Vitamin D deficiency     HOSPITAL COURSE:   JohnnieAllenis a82 y.o.malewith a known history of systolic congestive heart failure presents with shortness of breath and swelling. The patient is a very poor historian. He states that he lived in [place] and now is in a rest home here  1. Acute on chronic systolic congestive heart failure with anasarca.  -echo shows EF of 20 to 25% with severe hypo kinesis -cardiology consultation appreciated - Lasix 80 mg IV twice daily. ---Diuresis well. Was Changed to torsemide 40 mg daily--now back on IV lasix 80 mg bid per Dr END---change to oral torsemide 40 mg BID per Dr. Fletcher Anon - will add prn metolazone for weight >5 lbs  -patient had ICD placement in the past which was removed secondary to MSSA sepsis -echo shows pericardial effusion moderate amount without signs of tamponade  2. Relative hypotension limited with any other medications for blood pressure and CHF at this point. -Start low-dose Coreg -consider adding spironolactone  3.Atrial fibrillation on amiodarone and aspirin  4.Hypothyroidism unspecified on levothyroxine -TSH elevated 8.8 increase  Synthroid  6.Depression pt was  on Zoloft.  - will switch this over to Celexa with his fluid weight gain  7. Chronic kidney disease stage IV. Monitor with diuresis patient has chronic kidney disease creatinine is around 2 to 2.5. -appreciate neurology input  8.Elevated troponin likely demand ischemia  9.Moderate malnutrition dietitian to see  10. Scrotal swelling due to anasarca - scrotal ultrasound showed fluid retention with some bowel loops noted is found. Noted some bowels herniated into the scrotum. No tenderness. - no signs of strangulation noted  Overall patient care has  Poor long term prognosis.  Spoke with dter Joanie Coddington over the phone yday okay from cardiology standpoint for patient to be discharged to his long-term care. Patient is agreeable and eager to be discharge.  Patient will be followed by palliative care at the facility.  CONSULTS OBTAINED:  Treatment Team:  Lavonia Dana, MD Jettie Booze, MD Wellington Hampshire, MD  DRUG ALLERGIES:  No Known Allergies  DISCHARGE MEDICATIONS:   Allergies as of 11/20/2017   No Known Allergies     Medication List    STOP taking these medications   dicloxacillin 250 MG capsule Commonly known as:  DYNAPEN   sertraline 25 MG tablet Commonly known as:  ZOLOFT     TAKE these medications   acetaminophen 325 MG tablet Commonly known as:  TYLENOL Take 975 mg by mouth every 8 (eight) hours as needed.   ACIDOPHILUS PO Take 175 mg by mouth every 8 (eight) hours as needed.   allopurinol 100 MG tablet Commonly known as:  ZYLOPRIM Take 0.5 tablets (50 mg total) by  mouth daily. What changed:  how much to take   amiodarone 100 MG tablet Commonly known as:  PACERONE Take 100 mg by mouth daily.   aspirin EC 81 MG tablet Take 81 mg by mouth daily.   bisacodyl 10 MG suppository Commonly known as:  DULCOLAX Place 10 mg rectally daily as needed for moderate constipation.   carvedilol 3.125 MG  tablet Commonly known as:  COREG Take 1 tablet (3.125 mg total) by mouth 2 (two) times daily with a meal.   citalopram 40 MG tablet Commonly known as:  CELEXA Take 1 tablet (40 mg total) by mouth daily. Start taking on:  11/21/2017   dorzolamide 2 % ophthalmic solution Commonly known as:  TRUSOPT Place 1 drop into both eyes 2 (two) times daily.   feeding supplement (ENSURE ENLIVE) Liqd Take 237 mLs by mouth 2 (two) times daily between meals. Start taking on:  11/21/2017   GAVILAX powder Generic drug:  polyethylene glycol powder Take 17 g by mouth daily.   ipratropium-albuterol 0.5-2.5 (3) MG/3ML Soln Commonly known as:  DUONEB Take 3 mLs by nebulization every 6 (six) hours as needed.   latanoprost 0.005 % ophthalmic solution Commonly known as:  XALATAN Place 1 drop into both eyes at bedtime.   levothyroxine 75 MCG tablet Commonly known as:  SYNTHROID, LEVOTHROID Take 75 mcg by mouth daily before breakfast.   loratadine 10 MG tablet Commonly known as:  CLARITIN Take 10 mg by mouth daily.   Melatonin 3 MG Tabs Take 1 tablet by mouth at bedtime.   metolazone 2.5 MG tablet Commonly known as:  ZAROXOLYN Take 1 tablet (2.5 mg total) by mouth daily as needed (weight gain greater than 5 pounds).   ondansetron 8 MG tablet Commonly known as:  ZOFRAN Take by mouth every 8 (eight) hours as needed for nausea or vomiting.   sennosides-docusate sodium 8.6-50 MG tablet Commonly known as:  SENOKOT-S Take 1 tablet by mouth 2 (two) times daily.   simethicone 80 MG chewable tablet Commonly known as:  MYLICON Chew 80 mg by mouth every 6 (six) hours as needed for flatulence.   torsemide 20 MG tablet Commonly known as:  DEMADEX Take 2 tablets (40 mg total) by mouth 2 (two) times daily. What changed:  how much to take       If you experience worsening of your admission symptoms, develop shortness of breath, life threatening emergency, suicidal or homicidal thoughts you must seek  medical attention immediately by calling 911 or calling your MD immediately  if symptoms less severe.  You Must read complete instructions/literature along with all the possible adverse reactions/side effects for all the Medicines you take and that have been prescribed to you. Take any new Medicines after you have completely understood and accept all the possible adverse reactions/side effects.   Please note  You were cared for by a hospitalist during your hospital stay. If you have any questions about your discharge medications or the care you received while you were in the hospital after you are discharged, you can call the unit and asked to speak with the hospitalist on call if the hospitalist that took care of you is not available. Once you are discharged, your primary care physician will handle any further medical issues. Please note that NO REFILLS for any discharge medications will be authorized once you are discharged, as it is imperative that you return to your primary care physician (or establish a relationship with a primary care physician if  you do not have one) for your aftercare needs so that they can reassess your need for medications and monitor your lab values. Today   SUBJECTIVE    Overall better VITAL SIGNS:  Blood pressure 94/71, pulse 85, temperature 98.3 F (36.8 C), resp. rate 18, height 6\' 2"  (1.88 m), weight 89.1 kg (196 lb 8 oz), SpO2 97 %.  I/O:  No intake or output data in the 24 hours ending 11/20/17 1349  PHYSICAL EXAMINATION:  GENERAL:  82 y.o.-year-old patient lying in the bed with no acute distress.  EYES: Pupils equal, round, reactive to light and accommodation. No scleral icterus. Extraocular muscles intact.  HEENT: Head atraumatic, normocephalic. Oropharynx and nasopharynx clear.  NECK:  Supple, no jugular venous distention. No thyroid enlargement, no tenderness.  LUNGS: Normal breath sounds bilaterally, no wheezing, rales,rhonchi or crepitation. No use of  accessory muscles of respiration.  CARDIOVASCULAR: S1, S2 normal. No murmurs, rubs, or gallops.  ABDOMEN: Soft, non-tender, non-distended. Bowel sounds present. No organomegaly or mass. Scrotal edema++ EXTREMITIES: right knee chronic swelling NEUROLOGIC: Cranial nerves II through XII are intact. Muscle strength 5/5 in all extremities. Sensation intact. Gait not checked.  PSYCHIATRIC: The patient is alert and oriented x 3.  SKIN: No obvious rash, lesion, or ulcer.   DATA REVIEW:   CBC  Recent Labs  Lab 11/19/17 0802  WBC 5.3  HGB 10.3*  HCT 31.6*  PLT 171    Chemistries  Recent Labs  Lab 11/15/17 1749  11/19/17 0802  NA 138   < > 140  K 4.3   < > 3.8  CL 106   < > 110  CO2 18*   < > 18*  GLUCOSE 108*   < > 94  BUN 77*   < > 74*  CREATININE 2.67*   < > 2.36*  CALCIUM 8.9   < > 8.6*  AST 26  --   --   ALT 10*  --   --   ALKPHOS 78  --   --   BILITOT 1.2  --   --    < > = values in this interval not displayed.    Microbiology Results   Recent Results (from the past 240 hour(s))  MRSA PCR Screening     Status: None   Collection Time: 11/15/17 10:03 PM  Result Value Ref Range Status   MRSA by PCR NEGATIVE NEGATIVE Final    Comment:        The GeneXpert MRSA Assay (FDA approved for NASAL specimens only), is one component of a comprehensive MRSA colonization surveillance program. It is not intended to diagnose MRSA infection nor to guide or monitor treatment for MRSA infections. Performed at [facility] [address], [place], [place] [address]   Urine Culture     Status: Abnormal   Collection Time: 11/15/17 11:36 PM  Result Value Ref Range Status   Specimen Description   Final    URINE, RANDOM Performed at [facility], [address] [place], [place] [address]    Special Requests   Final    Normal Performed at [facility], [place], [place] [address]    Culture 80,000 COLONIES/mL ESCHERICHIA COLI (A)   Final   Report Status 11/18/2017 FINAL  Final   Organism ID, Bacteria ESCHERICHIA COLI (A)  Final      Susceptibility   Escherichia coli - MIC*    AMPICILLIN >=32 RESISTANT Resistant     CEFAZOLIN 32  INTERMEDIATE Intermediate     CEFTRIAXONE <=1 SENSITIVE Sensitive     CIPROFLOXACIN >=4 RESISTANT Resistant     GENTAMICIN <=1 SENSITIVE Sensitive     IMIPENEM 0.5 SENSITIVE Sensitive     NITROFURANTOIN 32 SENSITIVE Sensitive     TRIMETH/SULFA <=20 SENSITIVE Sensitive     AMPICILLIN/SULBACTAM 8 SENSITIVE Sensitive     PIP/TAZO <=4 SENSITIVE Sensitive     Extended ESBL NEGATIVE Sensitive     * 80,000 COLONIES/mL ESCHERICHIA COLI  Aerobic/Anaerobic Culture (surgical/deep wound)     Status: None (Preliminary result)   Collection Time: 11/17/17 10:36 PM  Result Value Ref Range Status   Specimen Description   Final    KNEE Performed at [facility], [address] [place], [place] [address]    Special Requests   Final    Normal Performed at [facility], [address] [place], [place] [address]    Gram Stain   Final    RARE WBC PRESENT, PREDOMINANTLY MONONUCLEAR NO ORGANISMS SEEN Performed at [facility], [place] [address] [place], [place] [address]    Culture   Final    FEW PSEUDOMONAS AERUGINOSA NO ANAEROBES ISOLATED; CULTURE IN PROGRESS FOR 5 DAYS    Report Status PENDING  Incomplete   Organism ID, Bacteria PSEUDOMONAS AERUGINOSA  Final      Susceptibility   Pseudomonas aeruginosa - MIC*    CEFTAZIDIME 4 SENSITIVE Sensitive     CIPROFLOXACIN <=0.25 SENSITIVE Sensitive     GENTAMICIN <=1 SENSITIVE Sensitive     IMIPENEM <=0.25 SENSITIVE Sensitive     PIP/TAZO 8 SENSITIVE Sensitive     CEFEPIME <=1 SENSITIVE Sensitive     * FEW PSEUDOMONAS AERUGINOSA    RADIOLOGY:  No results found.   Management plans discussed with the patient, family and they are in agreement.  CODE STATUS:     Code Status Orders  (From admission, onward)         Start     Ordered   11/15/17 2044  Full code  Continuous     11/15/17 2043    Code Status History    This patient has a current code status but no historical code status.      TOTAL TIME TAKING CARE OF THIS PATIENT: *40* minutes.    Fritzi Mandes M.D on 11/20/2017 at 1:49 PM  Between 7am to 6pm - Pager - [phone] After 6pm go to [email or web address] - password [email or web address] [facility]  Office  [phone]  [name]; [facility], [place]

## 2017-11-20 NOTE — Clinical Social Work Note (Signed)
Patient to be d/c'ed today to [facility] room [phone].  Patient and family agreeable to plans will transport via ems RN to call report [phone].  CSW spoke to patient's daughter Elyas Villamor at [phone], she is aware that patient will be discharging today to SNF.   Evette Cristal, MSW, [place]

## 2017-11-20 NOTE — Progress Notes (Signed)
Daily Progress Note   Patient Name: Alan Marshall       Date: 11/20/2017 DOB: 1936-04-26  Age: 83 y.o. MRN#: [id number] Attending Physician: Fritzi Mandes, MD Primary Care Physician: Center, [facility] Admit Date: 11/15/2017  Reason for Consultation/Follow-up: Establishing goals of care  Subjective: Patient remains alert and oriented on today. He is out of bed sitting up in chair. Denies any pain or discomfort at this time. Asking "if he will be leaving today". Advised once he is medically stable he will be able to return to [name]. Hoping for in the next day or so. States he is feeling much better. On assessment he does have decreased swelling in lower extremities and scrotum. No family at bedside. Left appropriate message updating his daughter, [name].   Chart Reviewed.   Length of Stay: 5  Current Medications: Scheduled Meds:  . allopurinol  50 mg Oral Daily  . amiodarone  100 mg Oral Daily  . aspirin EC  81 mg Oral Daily  . carvedilol  3.125 mg Oral BID WC  . citalopram  40 mg Oral Daily  . dorzolamide  1 drop Both Eyes BID  . doxycycline  100 mg Oral Q12H  . feeding supplement (ENSURE ENLIVE)  237 mL Oral BID BM  . furosemide  80 mg Intravenous BID  . heparin  5,000 Units Subcutaneous Q8H  . latanoprost  1 drop Both Eyes QHS  . levothyroxine  100 mcg Oral QAC breakfast  . pneumococcal 23 valent vaccine  0.5 mL Intramuscular Tomorrow-1000  . polyethylene glycol  17 g Oral Daily    Continuous Infusions:   PRN Meds: acetaminophen **OR** acetaminophen, albuterol, ondansetron **OR** ondansetron (ZOFRAN) IV, traMADol  Physical Exam  Constitutional: He is oriented to person, place, and time. Vital signs are normal. He appears well-developed. He is cooperative.    Cardiovascular: Normal rate, regular rhythm, normal heart sounds, intact distal pulses and normal pulses.  Pulmonary/Chest: Effort normal. He has decreased breath sounds.  Genitourinary: Right testis shows swelling. Left testis shows swelling.  Musculoskeletal:  Generalized weakness , right knee brace    Neurological: He is alert and oriented to person, place, and time.  Hx of dementia, more awake and alert today   Skin:  Bilateral lower extrem. Discoloration, dressing to left shin area.  Psychiatric: He has a normal mood and affect. His speech is normal and behavior is normal. Cognition and memory are impaired.  Patient is alert and oriented with hx of dementia             Vital Signs: BP 94/71 (BP Location: Left Arm)   Pulse 85   Temp 98.3 F (36.8 C)   Resp 18   Ht 6\' 2"  (1.88 m)   Wt 89.1 kg (196 lb 8 oz)   SpO2 97%   BMI 25.23 kg/m  SpO2: SpO2: 97 % O2 Device: O2 Device: Nasal Cannula O2 Flow Rate: O2 Flow Rate (L/min): 3 L/min  Intake/output summary:  No intake or output data in the 24 hours ending 11/20/17 1303 LBM: Last BM Date: 11/18/17 Baseline Weight: Weight: 91.7 kg (202 lb 2.6 oz) Most recent weight: Weight: 89.1 kg (196 lb 8 oz)       Palliative Assessment/Data:PPS 30%   Flowsheet Rows     Most Recent Value  Intake Tab  Referral Department  Hospitalist  Unit at Time of Referral  Med/Surg Unit  Palliative Care Primary Diagnosis  Cardiac  Date Notified  11/15/17  Palliative Care Type  New Palliative care  Reason for referral  Clarify Goals of Care  Date of Admission  11/15/17  Date first seen by Palliative Care  11/17/17  # of days Palliative referral response time  2 Day(s)  # of days IP prior to Palliative referral  0  Clinical Assessment  Psychosocial & Spiritual Assessment  Palliative Care Outcomes      Patient Active Problem List   Diagnosis Date Noted  . CHF (congestive heart failure) ([place]) 11/15/2017    Palliative Care Assessment &  Plan   Patient Profile: 82 y.o. male  admitted on 11/15/2017 from [facility] with swelling and shortness of breath. He has a past medical history of atrial flutter, systolic congestive heart failure, chronic kidney disease, hypertension, hypothyroidism, and glaucoma. Patient is a poor historian and no family was available at bedside. He reported to EDP that he was previously on dialysis but no longer goes. Also that 3 days prior to arrival his water pills had been increased. He reported significant swelling in his lower legs and feeling short of breath for 4-5 days prior to arrival. Shortness of breath caused him to be unable to lay flat. He also had some scrotal swelling. During his ED course he appeared grossly volume overloaded, but also had mild to moderate hypotension. EKG did not demonstrate ischemia but his troponin was minimally elevated likely some mild demand ischemia. Denied chest pain. No fevers or chills. Since admission he has been seen by cardiology and nephrology. Palliative Medicine Team consulted for goals of care.   Recommendations/Plan:  Full code at daughter's request.  Continue to treat the treatable.  Daughter verbalizes her wishes to proceed with current care even if this means aggressive measures such as CPR, intubation, or life-sustaining medications.  Case management aware for outpatient palliative services at discharge or transfer. Santiago Glad, RN with Palliative is aware.   Palliative medicine team will continue to support patient, family, and medical team during hospitalization.  Goals of Care and Additional Recommendations:  Limitations on Scope of Treatment: Full Scope Treatment  Code Status:    Code Status Orders  (From admission, onward)        Start     Ordered   11/15/17 2044  Full code  Continuous     11/15/17 2043  Code Status History    This patient has a current code status but no historical code status.       Prognosis:   Unable to  determine guarded to poor in the setting of acute on chronic systolic congestive heart failure with anasarca (EF 20-25% atrophy), poor attention, atrial fibrillation, depression, stage IV chronic kidney disease, demand ischemia, dementia, poor p.o. intake, moderate malnutrition, deconditioning, immobility, and repeat hospitalizations.  Discharge Planning:  Per daughter patient is to be discharged back to [facility] with outpatient Palliative Services.   Care plan was discussed with patient's daughter, Santiago Glad, RN [facility] Liaison), and Dr. Posey Pronto.   Thank you for allowing the Palliative Medicine Team to assist in the care of this patient.   Total Time 25 min.  Prolonged Time Billed  NO       Greater than 50%  of this time was spent counseling and coordinating care related to the above assessment and plan.  Alda Lea, NP-BC Palliative Medicine Team  Phone: [phone] Fax: [phone]  Please contact Palliative Medicine Team phone at [phone] for questions and concerns.

## 2017-11-20 NOTE — Progress Notes (Signed)
Called report to [facility] at [facility], will call EMS for transport. Will discontinue telemetry monitor and peripheral IV. Wife at bedside and updated on his care.

## 2017-11-20 NOTE — Care Management Important Message (Signed)
Copy of signed IM left with patient in room.  

## 2017-11-20 NOTE — Progress Notes (Signed)
EMS here to pickup patient.

## 2017-11-23 LAB — AEROBIC/ANAEROBIC CULTURE (SURGICAL/DEEP WOUND): SPECIAL REQUESTS: NORMAL

## 2017-11-23 LAB — AEROBIC/ANAEROBIC CULTURE W GRAM STAIN (SURGICAL/DEEP WOUND)

## 2017-12-04 ENCOUNTER — Ambulatory Visit (INDEPENDENT_AMBULATORY_CARE_PROVIDER_SITE_OTHER): Payer: Medicare Other | Admitting: Urology

## 2017-12-04 ENCOUNTER — Encounter: Payer: Self-pay | Admitting: Urology

## 2017-12-04 VITALS — BP 89/49 | HR 64 | Ht 74.0 in

## 2017-12-04 DIAGNOSIS — N430 Encysted hydrocele: Secondary | ICD-10-CM

## 2017-12-04 NOTE — Progress Notes (Signed)
12/04/2017 3:02 PM   Alan Marshall Dec 01, 1935 [id number]  Referring provider: Center, [facility] [address] [place], [place] [address]  No chief complaint on file.   HPI:  Pt referred for hydrocele vs hernia. 82 yo male in hospital for CHF, anasarca, CKD (Cr 2 - 2.5), dementia and malnutrition. He was in hospital June 2019 and scrotum noted to be swollen. US revealed right hydrocele vs hernia (possible bowel noted). He follows up today for exam. Alan Marshall is well today and not bothered by any of these findings.    PMH: Past Medical History:  Diagnosis Date  . Anemia   . Atrial flutter ([place])   . CHF (congestive heart failure) ([place])   . Chronic kidney disease   . Glaucoma   . Hypertension   . Hypothyroidism   . Vitamin D deficiency     Surgical History: Past Surgical History:  Procedure Laterality Date  . NO PAST SURGERIES      Home Medications:  Allergies as of 12/04/2017   No Known Allergies     Medication List        Accurate as of 12/04/17  3:02 PM. Always use your most recent med list.          acetaminophen 325 MG tablet Commonly known as:  TYLENOL Take 975 mg by mouth every 8 (eight) hours as needed.   ACIDOPHILUS PO Take 175 mg by mouth every 8 (eight) hours as needed.   allopurinol 100 MG tablet Commonly known as:  ZYLOPRIM Take 0.5 tablets (50 mg total) by mouth daily.   amiodarone 100 MG tablet Commonly known as:  PACERONE Take 100 mg by mouth daily.   aspirin EC 81 MG tablet Take 81 mg by mouth daily.   bisacodyl 10 MG suppository Commonly known as:  DULCOLAX Place 10 mg rectally daily as needed for moderate constipation.   carvedilol 3.125 MG tablet Commonly known as:  COREG Take 1 tablet (3.125 mg total) by mouth 2 (two) times daily with a meal.   citalopram 40 MG tablet Commonly known as:  CELEXA Take 1 tablet (40 mg total) by mouth daily.   dorzolamide 2 % ophthalmic solution Commonly known as:  TRUSOPT Place 1 drop into  both eyes 2 (two) times daily.   feeding supplement (ENSURE ENLIVE) Liqd Take 237 mLs by mouth 2 (two) times daily between meals.   GAVILAX powder Generic drug:  polyethylene glycol powder Take 17 g by mouth daily.   ipratropium-albuterol 0.5-2.5 (3) MG/3ML Soln Commonly known as:  DUONEB Take 3 mLs by nebulization every 6 (six) hours as needed.   latanoprost 0.005 % ophthalmic solution Commonly known as:  XALATAN Place 1 drop into both eyes at bedtime.   levothyroxine 75 MCG tablet Commonly known as:  SYNTHROID, LEVOTHROID Take 75 mcg by mouth daily before breakfast.   loratadine 10 MG tablet Commonly known as:  CLARITIN Take 10 mg by mouth daily.   Melatonin 3 MG Tabs Take 1 tablet by mouth at bedtime.   metolazone 2.5 MG tablet Commonly known as:  ZAROXOLYN Take 1 tablet (2.5 mg total) by mouth daily as needed (weight gain greater than 5 pounds).   ondansetron 8 MG tablet Commonly known as:  ZOFRAN Take by mouth every 8 (eight) hours as needed for nausea or vomiting.   sennosides-docusate sodium 8.6-50 MG tablet Commonly known as:  SENOKOT-S Take 1 tablet by mouth 2 (two) times daily.   simethicone 80 MG chewable tablet Commonly  known as:  MYLICON Chew 80 mg by mouth every 6 (six) hours as needed for flatulence.   torsemide 20 MG tablet Commonly known as:  DEMADEX Take 2 tablets (40 mg total) by mouth 2 (two) times daily.       Allergies: No Known Allergies  Family History: Family History  Problem Relation Age of Onset  . Hypertension Father     Social History:  reports that he has never smoked. He has never used smokeless tobacco. He reports that he drank alcohol. He reports that he does not use drugs.  ROS:                                        Physical Exam: There were no vitals taken for this visit.  Constitutional:  Alert and oriented, No acute distress. Elderly male in wheelchair. He can;t stand and is in a wheelchair  that doesn't recline. He's wearing pants with no button or zipper so a difficult exam.  HEENT: [place] AT, moist mucus membranes.  Trachea midline, no masses. Cardiovascular: No clubbing, cyanosis, or edema. Respiratory: Normal respiratory effort, no increased work of breathing. GI: Abdomen is soft, nontender, nondistended, no abdominal masses GU: No CVA tenderness; Penis buried -- large cystic scrotal mass. Compressible. Non-tender. I do believe I can get to the top of it bilaterally which would be more compatible with a hydrocele and likely scrotal edema component.  Lymph: No cervical or inguinal lymphadenopathy. Skin: No rashes, bruises or suspicious lesions. Neurologic: Grossly intact, no focal deficits, moving all 4 extremities. Psychiatric: Normal mood and affect.  Laboratory Data: Lab Results  Component Value Date   WBC 5.3 11/19/2017   HGB 10.3 (L) 11/19/2017   HCT 31.6 (L) 11/19/2017   MCV 92.3 11/19/2017   PLT 171 11/19/2017    Lab Results  Component Value Date   CREATININE 2.36 (H) 11/19/2017    No results found for: PSA  No results found for: TESTOSTERONE  No results found for: HGBA1C  Urinalysis    Component Value Date/Time   COLORURINE YELLOW (A) 11/15/2017 2336   APPEARANCEUR CLEAR (A) 11/15/2017 2336   LABSPEC 1.015 11/15/2017 2336   PHURINE 5.0 11/15/2017 2336   GLUCOSEU NEGATIVE 11/15/2017 2336   HGBUR NEGATIVE 11/15/2017 2336   BILIRUBINUR NEGATIVE 11/15/2017 2336   KETONESUR NEGATIVE 11/15/2017 2336   PROTEINUR NEGATIVE 11/15/2017 2336   NITRITE NEGATIVE 11/15/2017 2336   LEUKOCYTESUR TRACE (A) 11/15/2017 2336    Lab Results  Component Value Date   BACTERIA NONE SEEN 11/15/2017   Scrotal US images reviewed - - 11/16/2017   No results found for this or any previous visit. No results found for this or any previous visit. No results found for this or any previous visit. No results found for this or any previous visit. Results for orders placed  during the hospital encounter of 11/15/17  US RENAL   Narrative CLINICAL DATA:  Renal failure  EXAM: RENAL / URINARY TRACT ULTRASOUND COMPLETE  COMPARISON:  None.  FINDINGS: Right Kidney:  Length: 10.1 cm. Diffuse increased echotexture is noted. No mass or hydronephrosis visualized.  Left Kidney:  Length: 9.1 cm. Diffuse increased echotexture is noted. No hydronephrosis visualized. There is a 3.2 cm cyst in lower pole left kidney.  Bladder:  Appears normal for degree of bladder distention.  IMPRESSION: Diffuse increased echotexture of bilateral kidneys which can be seen in  medical renal disease.  Simple cyst of left kidney.   Electronically Signed   By: Abelardo Diesel M.D.   On: 11/16/2017 15:20    No results found for this or any previous visit. No results found for this or any previous visit. No results found for this or any previous visit.  Assessment & Plan:    Hydrocele vs hernia vs scrotal edema - I don't believe it's a hernia on exam today, but either way he's not a good candidate for hernia or hydrocele repair. Also aspiration has risks and it can recur. I would continue scrotal support.   No follow-ups on file.  Festus Aloe, MD  [facility] [address], [place], [place] [address] [phone]

## 2017-12-05 ENCOUNTER — Ambulatory Visit (INDEPENDENT_AMBULATORY_CARE_PROVIDER_SITE_OTHER): Payer: Medicare Other | Admitting: Nurse Practitioner

## 2017-12-05 ENCOUNTER — Ambulatory Visit (INDEPENDENT_AMBULATORY_CARE_PROVIDER_SITE_OTHER): Payer: Medicare Other

## 2017-12-05 ENCOUNTER — Other Ambulatory Visit: Payer: Self-pay

## 2017-12-05 ENCOUNTER — Other Ambulatory Visit
Admission: RE | Admit: 2017-12-05 | Discharge: 2017-12-05 | Disposition: A | Discharge status: Home / Self Care | Payer: Medicare Other | Source: Ambulatory Visit | Attending: Nurse Practitioner | Admitting: Nurse Practitioner

## 2017-12-05 ENCOUNTER — Ambulatory Visit
Admission: RE | Admit: 2017-12-05 | Discharge: 2017-12-05 | Disposition: A | Discharge status: Home / Self Care | Payer: Medicare Other | Source: Ambulatory Visit | Attending: Nurse Practitioner | Admitting: Nurse Practitioner

## 2017-12-05 ENCOUNTER — Encounter: Payer: Self-pay | Admitting: Nurse Practitioner

## 2017-12-05 VITALS — BP 98/60 | HR 75 | Ht 74.5 in | Wt 187.2 lb

## 2017-12-05 DIAGNOSIS — I3139 Other pericardial effusion (noninflammatory): Secondary | ICD-10-CM

## 2017-12-05 DIAGNOSIS — I428 Other cardiomyopathies: Secondary | ICD-10-CM

## 2017-12-05 DIAGNOSIS — R9389 Abnormal findings on diagnostic imaging of other specified body structures: Secondary | ICD-10-CM

## 2017-12-05 DIAGNOSIS — R918 Other nonspecific abnormal finding of lung field: Secondary | ICD-10-CM | POA: Diagnosis not present

## 2017-12-05 DIAGNOSIS — I313 Pericardial effusion (noninflammatory): Secondary | ICD-10-CM

## 2017-12-05 DIAGNOSIS — I5022 Chronic systolic (congestive) heart failure: Secondary | ICD-10-CM

## 2017-12-05 DIAGNOSIS — J90 Pleural effusion, not elsewhere classified: Secondary | ICD-10-CM | POA: Insufficient documentation

## 2017-12-05 DIAGNOSIS — I481 Persistent atrial fibrillation: Secondary | ICD-10-CM

## 2017-12-05 DIAGNOSIS — I4819 Other persistent atrial fibrillation: Secondary | ICD-10-CM

## 2017-12-05 LAB — ECHOCARDIOGRAM LIMITED
HEIGHTINCHES: 74.5 in
WEIGHTICAEL: 2996 [oz_av]

## 2017-12-05 NOTE — Progress Notes (Signed)
Office Visit    Patient Name: Alan Marshall Date of Encounter: 12/05/2017  Primary Care Provider:  [facility], [place] Primary Cardiologist:  Kathlyn Sacramento, MD  Chief Complaint    82 year old male with a history of HFrEF, nonischemic cardiomyopathy, stage IV chronic kidney disease, permanent A. fib/flutter, hypothyroidism, dementia, and moderate to large circumferential pericardial effusion, who presents for follow-up after recent hospitalization for heart failure.  Past Medical History    Past Medical History:  Diagnosis Date  . Anemia   . CKD (chronic kidney disease), stage IV ([place])   . Glaucoma   . HFrEF (heart failure with reduced ejection fraction) ([place])    a. 11/2017 Echo: EF <20, diff HK. mild to mod AI. Asc Ao 38mm (mod dil). Mod MR. Mod dil LA/RV/RA. PASP 4mmHg. Mod to large circumfirential pericardial effusion w/o tamponade.  . Hypertension   . Hypothyroidism   . NICM (nonischemic cardiomyopathy) ([place])    a. S/p prior AICD->extracted in 2015 2/2 MSSA bacteremia; b. 11/2017 Echo: EF <20%.  . Pericardial effusion    a. 11/2017 Echo: Mod to large circumfirential pericardial effusion w/o effusive-constrictive changes or tamponade physiology.  [name] Kitchen Permanent Atrial Fibrillation/Flutter ([place])    a. CHA2DS2VASc = 4-->No OAC 2/2 dementia.  . Vitamin D deficiency    Past Surgical History:  Procedure Laterality Date  . NO PAST SURGERIES      Allergies  No Known Allergies  History of Present Illness    82 year old male with above complex past medical history including HFrEF and nonischemic cardiomyopathy dating back several years.  He was previously followed at [facility] in the [facility] and at one point had an ICD which was subsequently  extracted in the setting of MSSA bacteremia in 2015.  Other history includes persistent A. fib/flutter, stage IV chronic kidney disease, diabetes, hypertension, hypothyroidism, and dementia.  He was recently hospitalized [facility]  in June with dyspnea and volume overload.  EF was less than 20% by echo with moderate mitral regurgitation, mild to moderate AI, moderate to large circumferential pericardial effusion without tamponade, and a pulmonary artery systolic pressure of 60 mmHg.  Diuresis was complicated by chronic kidney disease but he did have volume improvement and was subsequently discharged at a weight of 196.  He stays in a nursing home.  He is mostly bed and wheelchair bound.  Walking is very limited.  He denies any chest pain or dyspnea.  Lower extremity edema is significantly improved.  His weight is down to 187.  He has had no PND, orthopnea, dizziness, syncope, or early satiety.  Home Medications    Prior to Admission medications   Medication Sig Start Date End Date Taking? Authorizing Provider  acetaminophen (TYLENOL) 325 MG tablet Take 975 mg by mouth every 8 (eight) hours as needed.   Yes [provider]  albuterol (PROVENTIL) (2.5 MG/3ML) 0.083% nebulizer solution  11/13/17  Yes [provider]  allopurinol (ZYLOPRIM) 100 MG tablet Take 0.5 tablets (50 mg total) by mouth daily. 11/20/17  Yes Fritzi Mandes, MD  amiodarone (PACERONE) 100 MG tablet Take 100 mg by mouth daily.   Yes [provider]  aspirin EC 81 MG tablet Take 81 mg by mouth daily.   Yes [provider]  bisacodyl (DULCOLAX) 10 MG suppository Place 10 mg rectally daily as needed for moderate constipation.   Yes [provider]  carvedilol (COREG) 3.125 MG tablet Take 1 tablet (3.125 mg total) by mouth 2 (two) times daily  with a meal. 11/20/17  Yes Fritzi Mandes, MD  citalopram (CELEXA) 20 MG tablet Take 20 mg by mouth daily.   Yes [provider]  dorzolamide (TRUSOPT) 2 % ophthalmic solution Place 1 drop into both eyes 2 (two) times daily.   Yes [provider]  feeding supplement, ENSURE ENLIVE, (ENSURE ENLIVE) LIQD Take 237 mLs by mouth 2 (two) times daily between meals. 11/21/17  Yes  Fritzi Mandes, MD  ipratropium-albuterol (DUONEB) 0.5-2.5 (3) MG/3ML SOLN Take 3 mLs by nebulization every 6 (six) hours as needed.   Yes [provider]  Lactobacillus (ACIDOPHILUS PO) Take 175 mg by mouth every 8 (eight) hours as needed.   Yes [provider]  latanoprost (XALATAN) 0.005 % ophthalmic solution Place 1 drop into both eyes at bedtime.   Yes [provider]  levothyroxine (SYNTHROID, LEVOTHROID) 75 MCG tablet Take 75 mcg by mouth daily before breakfast.   Yes [provider]  loratadine (CLARITIN) 10 MG tablet Take 10 mg by mouth daily.   Yes [provider]  Melatonin 3 MG TABS Take 1 tablet by mouth at bedtime.   Yes [provider]  metolazone (ZAROXOLYN) 2.5 MG tablet Take 1 tablet (2.5 mg total) by mouth daily as needed (weight gain greater than 5 pounds). 11/20/17  Yes Fritzi Mandes, MD  ondansetron (ZOFRAN) 8 MG tablet Take by mouth every 8 (eight) hours as needed for nausea or vomiting.   Yes [provider]  polyethylene glycol powder (GAVILAX) powder Take 17 g by mouth daily.   Yes [provider]  sennosides-docusate sodium (SENOKOT-S) 8.6-50 MG tablet Take 1 tablet by mouth 2 (two) times daily.   Yes [provider]  simethicone (MYLICON) 80 MG chewable tablet Chew 80 mg by mouth every 6 (six) hours as needed for flatulence.   Yes [provider]  torsemide (DEMADEX) 20 MG tablet Take 2 tablets (40 mg total) by mouth 2 (two) times daily. 11/20/17  Yes Fritzi Mandes, MD    Review of Systems    Mild right lower extremity edema.  This is chronic and overall improved.  Activity is very limited.  He denies chest pain, palpitations, PND, orthopnea, dizziness, syncope, or early satiety.  All other systems reviewed and are otherwise negative except as noted above.  Physical Exam    VS:  BP 98/60 (BP Location: Right Arm, Patient Position: Sitting, Cuff Size: Normal)   Pulse 75   Ht 6' 2.5"  (1.892 m)   Wt 187 lb 4 oz (84.9 kg)   BMI 23.72 kg/m  , BMI Body mass index is 23.72 kg/m. GEN: Well nourished, well developed, in no acute distress.  HEENT: normal.  Neck: Supple, no JVD, carotid bruits, or masses. Cardiac: Irregularly irregular with a 2/6 systolic murmur heard throughout and rub noted at apex.  No clubbing, cyanosis, 1+ right knee and calf edema.  Per patient this is improved.  Radials/DP/PT 1 + and equal bilaterally.  Respiratory:  Respirations regular and unlabored, markedly diminished breath sounds on the left with diminished breath sounds in the right. GI: Soft, nontender, nondistended, BS + x 4. MS: no deformity or atrophy. Skin: warm and dry, no rash. Neuro:  Strength and sensation are intact. Psych: Normal affect.  Accessory Clinical Findings    ECG -atrial fibrillation, 75, inferior and anterior lateral infarcts, IVCD no acute changes.  Assessment & Plan    1.  Nonischemic cardiomyopathy/HFrEF: Relatively euvolemic today.  Edema significantly improved per patient.  His weight is down 9 pounds since discharge.  In the setting of stage IV kidney disease, I am concerned he might even be a little bit dehydrated.  Follow-up basic metabolic panel today.  He remains on low-dose beta-blocker as well as torsemide.  No ace/ARB/ARNI/MRA in the setting of stage IV kidney disease.   2.  Moderate to large pericardial effusion: Noted on echo earlier this month.  Patient does have a rub on exam.  He is hemodynamically stable.  I called his daughter, who is his power of attorney to ask how we should address this going forward.  He is a full code and wishes to have invasive procedures if we felt they were to be necessary.  In that setting, I will follow-up a limited echocardiogram to reevaluate his effusion and determine whether or not pericardiocentesis will be necessary at some point.  3.  Abnormal chest x-ray: On last admission, patient was noted to have consolidation of the  left mid and lung base.  This was prior to diuresis.  He moves very little air on the left.  I am going to repeat a PA and lateral chest x-ray today.  He may require pulmonary referral.  Interestingly, he has not had any significant dyspnea, though activity is limited.  4.  Stage IV chronic kidney disease: Follow-up basic metabolic panel today.  He may need a reduced dose of torsemide pending creatinine.  5.  Essential hypertension: Stable.  6.  Afib/flutter:  Persistent and rate controlled on amio/ blocker.  No OAC 2/2 dementia/comorbidities.  7.  Disposition: Follow-up basic metabolic panel today as well as a chest x-ray.  Follow-up limited echo to assess effusion.  Follow-up in clinic in 1 month or sooner if necessary.  Murray Hodgkins, NP 12/05/2017, 8:55 AM

## 2017-12-05 NOTE — Patient Instructions (Signed)
Labwork: Labs today and we will call you with those results.   Testing/Procedures: Your physician has requested that you have an echocardiogram. Echocardiography is a painless test that uses sound waves to create images of your heart. It provides your doctor with information about the size and shape of your heart and how well your heart's chambers and valves are working. This procedure takes approximately one hour. There are no restrictions for this procedure.  A chest x-ray takes a picture of the organs and structures inside the chest, including the heart, lungs, and blood vessels. This test can show several things, including, whether the heart is enlarges; whether fluid is building up in the lungs; and whether pacemaker / defibrillator leads are still in place.   Follow-Up: Your physician recommends that you schedule a follow-up appointment in: 1 month with Dr. Fletcher Anon.   It was a pleasure seeing you today here in the office. Please do not hesitate to give [place] a call back if you have any further questions. [place], BSN     Any Other Special Instructions Will Be Listed Below (If Applicable).     If you need a refill on your cardiac medications before your next appointment, please call your pharmacy.

## 2017-12-06 LAB — BASIC METABOLIC PANEL
BUN / CREAT RATIO: 28 — AB (ref 10–24)
BUN: 67 mg/dL — ABNORMAL HIGH (ref 8–27)
CO2: 20 mmol/L (ref 20–29)
CREATININE: 2.41 mg/dL — AB (ref 0.76–1.27)
Calcium: 8.3 mg/dL — ABNORMAL LOW (ref 8.6–10.2)
Chloride: 106 mmol/L (ref 96–106)
GFR calc non Af Amer: 25 mL/min/{1.73_m2} — ABNORMAL LOW (ref >59)
GFR, EST AFRICAN AMERICAN: 28 mL/min/{1.73_m2} — AB (ref >59)
Glucose: 98 mg/dL (ref 65–99)
POTASSIUM: 4 mmol/L (ref 3.5–5.2)
SODIUM: 142 mmol/L (ref 134–144)

## 2017-12-08 ENCOUNTER — Telehealth: Payer: Self-pay | Admitting: Nurse Practitioner

## 2017-12-08 DIAGNOSIS — J90 Pleural effusion, not elsewhere classified: Secondary | ICD-10-CM

## 2017-12-08 NOTE — Telephone Encounter (Signed)
The patient's daughter, Elmyra Ricks [facility]), is aware of his chest x-ray results. She is agreeable with a referral to pulmonology. She states it is best to reach out to [facility] (patient's nursing facility) to arrange an appointment.  I advised I will have our scheduling dept reach out to [facility].

## 2017-12-09 ENCOUNTER — Ambulatory Visit: Payer: Medicare Other | Attending: Family | Admitting: Family

## 2017-12-09 ENCOUNTER — Encounter: Payer: Self-pay | Admitting: Family

## 2017-12-09 VITALS — BP 85/66 | HR 82 | Resp 18 | Ht 74.0 in | Wt 192.4 lb

## 2017-12-09 DIAGNOSIS — I428 Other cardiomyopathies: Secondary | ICD-10-CM | POA: Diagnosis not present

## 2017-12-09 DIAGNOSIS — M109 Gout, unspecified: Secondary | ICD-10-CM | POA: Diagnosis not present

## 2017-12-09 DIAGNOSIS — F329 Major depressive disorder, single episode, unspecified: Secondary | ICD-10-CM | POA: Diagnosis not present

## 2017-12-09 DIAGNOSIS — Z950 Presence of cardiac pacemaker: Secondary | ICD-10-CM | POA: Diagnosis not present

## 2017-12-09 DIAGNOSIS — E039 Hypothyroidism, unspecified: Secondary | ICD-10-CM | POA: Insufficient documentation

## 2017-12-09 DIAGNOSIS — I482 Chronic atrial fibrillation, unspecified: Secondary | ICD-10-CM

## 2017-12-09 DIAGNOSIS — I313 Pericardial effusion (noninflammatory): Secondary | ICD-10-CM | POA: Diagnosis not present

## 2017-12-09 DIAGNOSIS — E559 Vitamin D deficiency, unspecified: Secondary | ICD-10-CM | POA: Insufficient documentation

## 2017-12-09 DIAGNOSIS — D649 Anemia, unspecified: Secondary | ICD-10-CM | POA: Diagnosis not present

## 2017-12-09 DIAGNOSIS — I130 Hypertensive heart and chronic kidney disease with heart failure and stage 1 through stage 4 chronic kidney disease, or unspecified chronic kidney disease: Secondary | ICD-10-CM | POA: Insufficient documentation

## 2017-12-09 DIAGNOSIS — I5022 Chronic systolic (congestive) heart failure: Secondary | ICD-10-CM | POA: Diagnosis not present

## 2017-12-09 DIAGNOSIS — I950 Idiopathic hypotension: Secondary | ICD-10-CM

## 2017-12-09 DIAGNOSIS — R0602 Shortness of breath: Secondary | ICD-10-CM | POA: Diagnosis not present

## 2017-12-09 DIAGNOSIS — N184 Chronic kidney disease, stage 4 (severe): Secondary | ICD-10-CM | POA: Insufficient documentation

## 2017-12-09 DIAGNOSIS — Z79899 Other long term (current) drug therapy: Secondary | ICD-10-CM | POA: Diagnosis not present

## 2017-12-09 DIAGNOSIS — J90 Pleural effusion, not elsewhere classified: Secondary | ICD-10-CM

## 2017-12-09 DIAGNOSIS — Z7982 Long term (current) use of aspirin: Secondary | ICD-10-CM | POA: Insufficient documentation

## 2017-12-09 DIAGNOSIS — Z8249 Family history of ischemic heart disease and other diseases of the circulatory system: Secondary | ICD-10-CM | POA: Insufficient documentation

## 2017-12-09 DIAGNOSIS — Z79890 Hormone replacement therapy (postmenopausal): Secondary | ICD-10-CM | POA: Diagnosis not present

## 2017-12-09 DIAGNOSIS — I959 Hypotension, unspecified: Secondary | ICD-10-CM | POA: Insufficient documentation

## 2017-12-09 DIAGNOSIS — K219 Gastro-esophageal reflux disease without esophagitis: Secondary | ICD-10-CM | POA: Insufficient documentation

## 2017-12-09 NOTE — Progress Notes (Signed)
[name], male    DOB: 08-26-1935, 82 y.o.   MRN: [id number]  HPI  [name] is a 82 y/o male with a history of HTN, CKD, thyroid disease, anemia, atrial fibrillation, vitamin D deficiency, pericardial effusion, GERD, gout, depression and chronic heart failure.   Echo report from 12/05/17 reviewed and showed an EF of 15-20% along with moderate AR/ TR, mild to moderate MR and a mildly increased PA pressure of 42 mm Hg. Moderate pericardial effusion also noted.   Admitted 11/15/17 due to acute on chronic HF. Initially needed IV lasix and then transitioned to oral diuretics. Cardiology, nephrology and palliative care consults were obtained. Hypotension limiting medication titration. Echo showed pericardial effusion but without tamponade. Elevated troponin thought to be due to demand ischemia. Discharged after 5 days to SNF.  He presents today for his initial visit with a chief complaint of moderate shortness of breath upon minimal exertion. He says this has been chronic in nature having been present for several years. He has associated fatigue, pedal edema and right knee pain along with this. He denies any difficulty sleeping, abdominal distention, palpitations, chest pain, cough, dizziness or weight gain. He says that he's currently receiving physical therapy at SNF.   Past Medical History:  Diagnosis Date  . Anemia   . CHF (congestive heart failure) ([place])   . CKD (chronic kidney disease), stage IV ([place])   . Depression   . GERD (gastroesophageal reflux disease)   . Glaucoma   . Gout   . HFrEF (heart failure with reduced ejection fraction) ([place])    a. 11/2017 Echo: EF <20, diff HK. mild to mod AI. Asc Ao 78mm (mod dil). Mod MR. Mod dil LA/RV/RA. PASP 61mmHg. Mod to large circumferential pericardial effusion w/o tamponade.  . Hypertension   . Hypothyroidism   . NICM (nonischemic cardiomyopathy) ([place])    a. S/p prior AICD->extracted in 2015 2/2 MSSA bacteremia; b. s/p Subcutaneous  ICD; c. 11/2017 Echo: EF <20%.  . Pericardial effusion    a. 11/2017 Echo: Mod to large circumferential pericardial effusion w/o effusive-constrictive changes or tamponade physiology.  [name] Kitchen Permanent Atrial Fibrillation/Flutter ([place])    a. CHA2DS2VASc = 4-->No OAC 2/2 dementia.  . Vitamin D deficiency    Past Surgical History:  Procedure Laterality Date  . INSERT / REPLACE / REMOVE PACEMAKER     Family History  Problem Relation Age of Onset  . Hypertension Father    Social History   Tobacco Use  . Smoking status: Never Smoker  . Smokeless tobacco: Never Used  Substance Use Topics  . Alcohol use: Not Currently   No Known Allergies Prior to Admission medications   Medication Sig Start Date End Date Taking? Authorizing Provider  acetaminophen (TYLENOL) 325 MG tablet Take 975 mg by mouth every 8 (eight) hours as needed.   Yes [provider]  albuterol (PROVENTIL) (2.5 MG/3ML) 0.083% nebulizer solution  11/13/17  Yes [provider]  allopurinol (ZYLOPRIM) 100 MG tablet Take 0.5 tablets (50 mg total) by mouth daily. 11/20/17  Yes Fritzi Mandes, MD  amiodarone (PACERONE) 100 MG tablet Take 100 mg by mouth daily.   Yes [provider]  aspirin EC 81 MG tablet Take 81 mg by mouth daily.   Yes [provider]  bisacodyl (DULCOLAX) 10 MG suppository Place 10 mg rectally daily as needed for moderate constipation.   Yes [provider]  carvedilol (COREG) 3.125 MG tablet Take 1 tablet (3.125 mg total) by  mouth 2 (two) times daily with a meal. 11/20/17  Yes Fritzi Mandes, MD  citalopram (CELEXA) 20 MG tablet Take 20 mg by mouth daily.   Yes [provider]  dorzolamide (TRUSOPT) 2 % ophthalmic solution Place 1 drop into both eyes 2 (two) times daily.   Yes [provider]  ipratropium-albuterol (DUONEB) 0.5-2.5 (3) MG/3ML SOLN Take 3 mLs by nebulization every 6 (six) hours as needed.   Yes [provider]  latanoprost (XALATAN) 0.005  % ophthalmic solution Place 1 drop into both eyes at bedtime.   Yes [provider]  levothyroxine (SYNTHROID, LEVOTHROID) 75 MCG tablet Take 75 mcg by mouth daily before breakfast.   Yes [provider]  loratadine (CLARITIN) 10 MG tablet Take 10 mg by mouth daily.   Yes [provider]  Melatonin 3 MG TABS Take 1 tablet by mouth at bedtime.   Yes [provider]  metolazone (ZAROXOLYN) 2.5 MG tablet Take 1 tablet (2.5 mg total) by mouth daily as needed (weight gain greater than 5 pounds). 11/20/17  Yes Fritzi Mandes, MD  polyethylene glycol powder (GAVILAX) powder Take 17 g by mouth daily.   Yes [provider]  sennosides-docusate sodium (SENOKOT-S) 8.6-50 MG tablet Take 1 tablet by mouth 2 (two) times daily.   Yes [provider]  simethicone (MYLICON) 80 MG chewable tablet Chew 80 mg by mouth every 6 (six) hours as needed for flatulence.   Yes [provider]  torsemide (DEMADEX) 20 MG tablet Take 2 tablets (40 mg total) by mouth 2 (two) times daily. 11/20/17  Yes Fritzi Mandes, MD    Review of Systems  Constitutional: Positive for fatigue. Negative for appetite change.  HENT: Negative for congestion, postnasal drip and sore throat.   Eyes: Negative.   Respiratory: Positive for shortness of breath (with minimal exertion). Negative for cough and chest tightness.   Cardiovascular: Positive for leg swelling (trace right lower leg). Negative for chest pain and palpitations.  Gastrointestinal: Negative for abdominal distention and abdominal pain.  Endocrine: Negative.   Genitourinary: Negative.   Musculoskeletal: Positive for arthralgias (right knee). Negative for back pain.  Skin: Negative.   Allergic/Immunologic: Negative.   Neurological: Negative for dizziness and light-headedness.  Hematological: Negative for adenopathy. Does not bruise/bleed easily.  Psychiatric/Behavioral: Negative for dysphoric mood and sleep disturbance. The  patient is not nervous/anxious.        Memory loss    Vitals:   12/09/17 0841  BP: (!) 85/66  Pulse: 82  Resp: 18  SpO2: 100%  Weight: 192 lb 6 oz (87.3 kg)  Height: 6\' 2"  (1.88 m)   Wt Readings from Last 3 Encounters:  12/09/17 192 lb 6 oz (87.3 kg)  12/05/17 187 lb 4 oz (84.9 kg)  11/20/17 196 lb 8 oz (89.1 kg)   Lab Results  Component Value Date   CREATININE 2.41 (H) 12/05/2017   CREATININE 2.36 (H) 11/19/2017   CREATININE 2.38 (H) 11/18/2017    Physical Exam  Constitutional: He appears well-developed and well-nourished.  HENT:  Head: Normocephalic and atraumatic.  Neck: Normal range of motion. Neck supple. No JVD present.  Cardiovascular: Normal rate. An irregular rhythm present.  Pulmonary/Chest: Effort normal. No respiratory distress. He has no wheezes. He has no rales.  Abdominal: Soft. He exhibits no distension. There is no tenderness.  Musculoskeletal: He exhibits edema (trace right lower leg). He exhibits no tenderness.  Neurological: He is alert. He displays no tremor.  Skin: Skin is warm  and dry.  Bandage over left shin  Psychiatric: He has a normal mood and affect. His behavior is normal.  Nursing note and vitals reviewed.  Assessment & Plan:  1: Chronic heart failure with reduced ejection fraction- - NYHA class III - euvolemic today - being weighed daily at SNF. Order written for them to call for an overnight weight gain of >2 pounds or a weekly weight gain of >5 pounds - not adding salt to his food. Caregiver with him doesn't think the food is cooked with salt either. - saw cardiology Sharolyn Douglas) 12/05/17 & returns August 2019 - currently receiving PT at [facility] - BP will not be able to tolerate entresto or titration of carvedilol at this time - BNP 11/15/17 was 2886.0  2: Hypotension- - BP low chronically and low today but patient denies any dizziness - saw PCP Ola Spurr) 10/10/17 - BMP 12/05/17 reviewed and showed sodium 142, potassium 4.0 and  GFR 28  3: Atrial fibrillation- - currently on amiodarone and beta-blocker - no anticoagulant secondary to dementia/ comorbidities  4: Effusion- - still present on CXR - pulmonology referral has been set up by cardiology  Facility medication list was reviewed.  Return here in 6 weeks or sooner for any questions/problems before then.

## 2017-12-09 NOTE — Patient Instructions (Signed)
Continue weighing daily and call for an overnight weight gain of > 2 pounds or a weekly weight gain of >5 pounds. 

## 2017-12-10 DIAGNOSIS — I959 Hypotension, unspecified: Secondary | ICD-10-CM | POA: Insufficient documentation

## 2017-12-10 DIAGNOSIS — I4891 Unspecified atrial fibrillation: Secondary | ICD-10-CM | POA: Insufficient documentation

## 2017-12-10 DIAGNOSIS — J90 Pleural effusion, not elsewhere classified: Secondary | ICD-10-CM | POA: Insufficient documentation

## 2017-12-22 NOTE — Progress Notes (Addendum)
[name]      Assessment and Plan:  Pleural effusion. --Likely related to underlying CKD and CHF. Pt feels that he would want all interventions to help him live as long as possible. He has minimal to no dyspnea secondary to the effusion, however it is atypical left sided.  --Will Recheck CXR today to see if effusion is still present, if present with send for diagnostic thoracentesis to look for evidence of cancer/infection. Discussed that after drainage the fluid will likely recur. If all testing negative, given his end stage CHF would not drain it again unless it is causing dyspnea.   End-stage congestive heart failure, chronic kidney disease. - Contributing to above condition, will monitor.  Deconditioning/debility. - Patient is wheelchair-bound with minimal functional status.  Repeat CXR images personally reviewed, there is persistent left pleural effusion, moderate in size.  We will set him up for a thoracentesis.  Date: 12/23/2017  MRN# [id number] Alan Marshall [birth year]   Alan Marshall is a 82 y.o. old male seen in consultation for chief complaint of:    Chief Complaint  Patient presents with  . Consult    Referred by Alan Marshall for pleural effusion:  . Shortness of Breath    bending to tie shoes  . Chest Pain    occasional    HPI:   The patient is a 82 year old male with a history of severe systolic congestive heart failure, atrial fibrillation, stage IV chronic kidney disease, chronic pericardial effusion. He is present here today with his CNA from [facility], which [facility] he lives, SNF.  He is sitting in a motorized wheelchair.  Currently the patient is immobile and is a complete assist on lifting. He gets occasionally winded, but is not oxygen dependent, he is not limited by breathing.   **Chest x-ray 11/27/2017, 11/15/2017>> imaging personally reviewed, there is a left large pleural effusion, unchanged from previous. **Echo  report from 12/05/17>> EF of 15-20% along with moderate AR/ TR, mild to moderate MR and a mildly increased PA pressure of 42 mm Hg. Moderate pericardial effusion also noted.    PMHX:   Past Medical History:  Diagnosis Date  . Anemia   . CHF (congestive heart failure) ([place])   . CKD (chronic kidney disease), stage IV ([place])   . Depression   . GERD (gastroesophageal reflux disease)   . Glaucoma   . Gout   . HFrEF (heart failure with reduced ejection fraction) ([place])    a. 11/2017 Echo: EF <20, diff HK. mild to mod AI. Asc Ao 58mm (mod dil). Mod MR. Mod dil LA/RV/RA. PASP 46mmHg. Mod to large circumferential pericardial effusion w/o tamponade.  . Hypertension   . Hypothyroidism   . NICM (nonischemic cardiomyopathy) ([place])    a. S/p prior AICD->extracted in 2015 2/2 MSSA bacteremia; b. s/p Subcutaneous ICD; c. 11/2017 Echo: EF <20%.  . Pericardial effusion    a. 11/2017 Echo: Mod to large circumferential pericardial effusion w/o effusive-constrictive changes or tamponade physiology.  [name] Kitchen Permanent Atrial Fibrillation/Flutter ([place])    a. CHA2DS2VASc = 4-->No OAC 2/2 dementia.  . Vitamin D deficiency    Surgical Hx:  Past Surgical History:  Procedure Laterality Date  . INSERT / REPLACE / REMOVE PACEMAKER     Family Hx:  Family History  Problem Relation Age of Onset  . Hypertension Father    Social Hx:   Social History   Tobacco Use  . Smoking status: Never Smoker  . Smokeless tobacco: Never  Used  Substance Use Topics  . Alcohol use: Not Currently  . Drug use: Never   Medication:    Current Outpatient Medications:  .  acetaminophen (TYLENOL) 325 MG tablet, Take 975 mg by mouth every 8 (eight) hours as needed., Disp: , Rfl:  .  albuterol (PROVENTIL) (2.5 MG/3ML) 0.083% nebulizer solution, , Disp: , Rfl:  .  allopurinol (ZYLOPRIM) 100 MG tablet, Take 0.5 tablets (50 mg total) by mouth daily., Disp: 30 tablet, Rfl: 0 .  amiodarone (PACERONE) 100 MG tablet, Take 100 mg by mouth daily.,  Disp: , Rfl:  .  aspirin EC 81 MG tablet, Take 81 mg by mouth daily., Disp: , Rfl:  .  bisacodyl (DULCOLAX) 10 MG suppository, Place 10 mg rectally daily as needed for moderate constipation., Disp: , Rfl:  .  carvedilol (COREG) 3.125 MG tablet, Take 1 tablet (3.125 mg total) by mouth 2 (two) times daily with a meal., Disp: 60 tablet, Rfl: 1 .  citalopram (CELEXA) 20 MG tablet, Take 20 mg by mouth daily., Disp: , Rfl:  .  dicloxacillin (DYNAPEN) 500 MG capsule, Take 500 mg by mouth 3 (three) times daily., Disp: , Rfl:  .  dorzolamide (TRUSOPT) 2 % ophthalmic solution, Place 1 drop into both eyes 2 (two) times daily., Disp: , Rfl:  .  ipratropium-albuterol (DUONEB) 0.5-2.5 (3) MG/3ML SOLN, Take 3 mLs by nebulization every 6 (six) hours as needed., Disp: , Rfl:  .  latanoprost (XALATAN) 0.005 % ophthalmic solution, Place 1 drop into both eyes at bedtime., Disp: , Rfl:  .  levothyroxine (SYNTHROID, LEVOTHROID) 75 MCG tablet, Take 75 mcg by mouth daily before breakfast., Disp: , Rfl:  .  loratadine (CLARITIN) 10 MG tablet, Take 10 mg by mouth daily., Disp: , Rfl:  .  Melatonin 3 MG TABS, Take 1 tablet by mouth at bedtime., Disp: , Rfl:  .  metolazone (ZAROXOLYN) 2.5 MG tablet, Take 1 tablet (2.5 mg total) by mouth daily as needed (weight gain greater than 5 pounds)., Disp: 20 tablet, Rfl: 0 .  polyethylene glycol powder (GAVILAX) powder, Take 17 g by mouth daily., Disp: , Rfl:  .  sennosides-docusate sodium (SENOKOT-S) 8.6-50 MG tablet, Take 1 tablet by mouth 2 (two) times daily., Disp: , Rfl:  .  simethicone (MYLICON) 80 MG chewable tablet, Chew 80 mg by mouth every 6 (six) hours as needed for flatulence., Disp: , Rfl:  .  torsemide (DEMADEX) 20 MG tablet, Take 2 tablets (40 mg total) by mouth 2 (two) times daily., Disp: 60 tablet, Rfl: 1   Allergies:  Patient has no known allergies.  Review of Systems: Gen:  Denies  fever, sweats, chills HEENT: Denies blurred vision, double vision. bleeds, sore  throat Cvc:  No dizziness, chest pain. Resp:   Denies cough or sputum production, shortness of breath Gi: Denies swallowing difficulty, stomach pain. Gu:  Denies bladder incontinence, burning urine Ext:   No Joint pain, stiffness. Skin: No skin rash,  hives  Endoc:  No polyuria, polydipsia. Psych: No depression, insomnia. Other:  All other systems were reviewed with the patient and were negative other that what is mentioned in the HPI.   Physical Examination:   VS: BP (!) 86/58 (BP Location: Right Arm, Cuff Size: Normal)   Pulse 64   Resp 16   Ht 6\' 2"  (1.88 m)   SpO2 97%   BMI 24.70 kg/m   General Appearance: No distress  Neuro:without focal findings,  speech normal,  HEENT: PERRLA, EOM  intact.   Pulmonary: normal breath sounds, No wheezing.  Cardiovascular Murmur present.   Abdomen: Benign, Soft, non-tender. Renal:  No costovertebral tenderness  GU:  No performed at this time. Endoc: No evident thyromegaly, no signs of acromegaly. Skin:   warm, no rashes, no ecchymosis  Extremities: normal, no cyanosis, clubbing.  Other findings:    LABORATORY PANEL:   CBC No results for input(s): WBC, HGB, HCT, PLT in the last 168 hours. ------------------------------------------------------------------------------------------------------------------  Chemistries  No results for input(s): NA, K, CL, CO2, GLUCOSE, BUN, CREATININE, CALCIUM, MG, AST, ALT, ALKPHOS, BILITOT in the last 168 hours.  Invalid input(s): GFRCGP ------------------------------------------------------------------------------------------------------------------  Cardiac Enzymes No results for input(s): TROPONINI in the last 168 hours. ------------------------------------------------------------  RADIOLOGY:  No results found.     Thank  you for the consultation and for allowing [facility] to assist in the care of your patient. Our recommendations are noted above.  Please contact  us if we can be of further service.   Marda Stalker, M.D., F.C.C.P.  Board Certified in Internal Medicine, Pulmonary Medicine, [place], and Sleep Medicine.   Pulmonary and Critical Care Office Number: [phone]   12/23/2017

## 2017-12-23 ENCOUNTER — Encounter: Payer: Self-pay | Admitting: Internal Medicine

## 2017-12-23 ENCOUNTER — Ambulatory Visit
Admission: RE | Admit: 2017-12-23 | Discharge: 2017-12-23 | Disposition: A | Discharge status: Home / Self Care | Payer: Medicare Other | Source: Ambulatory Visit | Attending: Internal Medicine | Admitting: Internal Medicine

## 2017-12-23 ENCOUNTER — Ambulatory Visit (INDEPENDENT_AMBULATORY_CARE_PROVIDER_SITE_OTHER): Payer: Medicare Other | Admitting: Internal Medicine

## 2017-12-23 VITALS — BP 86/58 | HR 64 | Resp 16 | Ht 74.0 in

## 2017-12-23 DIAGNOSIS — J90 Pleural effusion, not elsewhere classified: Secondary | ICD-10-CM

## 2017-12-23 NOTE — Patient Instructions (Signed)
Will send for chest xray to see if fluid is still present. If present will schedule a drainage procedure.

## 2017-12-23 NOTE — Addendum Note (Signed)
Addended by: Laverle Hobby on: 12/23/2017 03:38 PM   Modules accepted: Orders

## 2017-12-24 ENCOUNTER — Telehealth: Payer: Self-pay | Admitting: *Deleted

## 2017-12-24 NOTE — Telephone Encounter (Signed)
-----   Message from Laverle Hobby, MD sent at 12/23/2017  3:38 PM EDT ----- Regarding: needs thoracentesis.  Repeat CXR reviewed; continues to have left pleural effusion. Ordered left thoracentesis by IR.

## 2017-12-24 NOTE — Telephone Encounter (Signed)
Called and spoke with Marney Doctor and she is aware of appointment date, and arrival timie. Time. RC

## 2017-12-25 ENCOUNTER — Ambulatory Visit
Admission: RE | Admit: 2017-12-25 | Discharge: 2017-12-25 | Disposition: A | Discharge status: Home / Self Care | Payer: Medicare Other | Source: Ambulatory Visit | Attending: Internal Medicine | Admitting: Internal Medicine

## 2017-12-25 ENCOUNTER — Ambulatory Visit
Admission: RE | Admit: 2017-12-25 | Discharge: 2017-12-25 | Disposition: A | Discharge status: Home / Self Care | Payer: Medicare Other | Source: Ambulatory Visit | Attending: Diagnostic Radiology | Admitting: Diagnostic Radiology

## 2017-12-25 DIAGNOSIS — Z9889 Other specified postprocedural states: Secondary | ICD-10-CM | POA: Insufficient documentation

## 2017-12-25 DIAGNOSIS — J90 Pleural effusion, not elsewhere classified: Secondary | ICD-10-CM | POA: Insufficient documentation

## 2017-12-25 LAB — LACTATE DEHYDROGENASE, PLEURAL OR PERITONEAL FLUID: LD, Fluid: 75 U/L — ABNORMAL HIGH (ref 3–23)

## 2017-12-25 LAB — ALBUMIN, PLEURAL OR PERITONEAL FLUID: ALBUMIN FL: 1.4 g/dL

## 2017-12-25 LAB — BODY FLUID CELL COUNT WITH DIFFERENTIAL
Eos, Fluid: 0 %
LYMPHS FL: 59 %
Monocyte-Macrophage-Serous Fluid: 4 %
NEUTROPHIL FLUID: 37 %
OTHER CELLS FL: 0 %
WBC FLUID: 264 uL

## 2017-12-25 LAB — PROTEIN, PLEURAL OR PERITONEAL FLUID: Total protein, fluid: <3 g/dL

## 2017-12-25 LAB — GLUCOSE, PLEURAL OR PERITONEAL FLUID: GLUCOSE FL: 76 mg/dL

## 2017-12-25 NOTE — Procedures (Signed)
US guided left thoracentesis. Removed 300 ml of left pleural fluid.  Minimal blood loss and no immediate complication.

## 2017-12-26 LAB — CYTOLOGY - NON PAP

## 2017-12-26 LAB — TRIGLYCERIDES, BODY FLUIDS: TRIGLYCERIDES FL: 35 mg/dL

## 2017-12-26 LAB — PROTEIN, BODY FLUID (OTHER): TOTAL PROTEIN, BODY FLUID OTHER: 3 g/dL

## 2017-12-27 LAB — ACID FAST SMEAR (AFB, MYCOBACTERIA): Acid Fast Smear: NEGATIVE

## 2017-12-28 LAB — BODY FLUID CULTURE
Culture: NO GROWTH
Gram Stain: NONE SEEN

## 2017-12-29 LAB — COMP PANEL: LEUKEMIA/LYMPHOMA

## 2018-01-04 ENCOUNTER — Other Ambulatory Visit: Payer: Medicare Other

## 2018-01-20 ENCOUNTER — Encounter: Payer: Self-pay | Admitting: Family

## 2018-01-20 ENCOUNTER — Ambulatory Visit: Payer: Medicare Other | Attending: Family | Admitting: Family

## 2018-01-20 VITALS — BP 96/76 | HR 78 | Resp 18 | Ht 74.0 in | Wt 197.2 lb

## 2018-01-20 DIAGNOSIS — I950 Idiopathic hypotension: Secondary | ICD-10-CM

## 2018-01-20 DIAGNOSIS — I5022 Chronic systolic (congestive) heart failure: Secondary | ICD-10-CM

## 2018-01-20 DIAGNOSIS — I482 Chronic atrial fibrillation, unspecified: Secondary | ICD-10-CM

## 2018-01-20 DIAGNOSIS — K219 Gastro-esophageal reflux disease without esophagitis: Secondary | ICD-10-CM | POA: Insufficient documentation

## 2018-01-20 DIAGNOSIS — N184 Chronic kidney disease, stage 4 (severe): Secondary | ICD-10-CM | POA: Insufficient documentation

## 2018-01-20 DIAGNOSIS — I313 Pericardial effusion (noninflammatory): Secondary | ICD-10-CM | POA: Diagnosis not present

## 2018-01-20 DIAGNOSIS — E559 Vitamin D deficiency, unspecified: Secondary | ICD-10-CM | POA: Insufficient documentation

## 2018-01-20 DIAGNOSIS — I130 Hypertensive heart and chronic kidney disease with heart failure and stage 1 through stage 4 chronic kidney disease, or unspecified chronic kidney disease: Secondary | ICD-10-CM | POA: Insufficient documentation

## 2018-01-20 DIAGNOSIS — M109 Gout, unspecified: Secondary | ICD-10-CM | POA: Diagnosis not present

## 2018-01-20 DIAGNOSIS — I428 Other cardiomyopathies: Secondary | ICD-10-CM | POA: Diagnosis not present

## 2018-01-20 DIAGNOSIS — R0602 Shortness of breath: Secondary | ICD-10-CM | POA: Insufficient documentation

## 2018-01-20 DIAGNOSIS — H409 Unspecified glaucoma: Secondary | ICD-10-CM | POA: Insufficient documentation

## 2018-01-20 DIAGNOSIS — Z8249 Family history of ischemic heart disease and other diseases of the circulatory system: Secondary | ICD-10-CM | POA: Diagnosis not present

## 2018-01-20 DIAGNOSIS — I959 Hypotension, unspecified: Secondary | ICD-10-CM | POA: Diagnosis not present

## 2018-01-20 DIAGNOSIS — D631 Anemia in chronic kidney disease: Secondary | ICD-10-CM | POA: Insufficient documentation

## 2018-01-20 DIAGNOSIS — F329 Major depressive disorder, single episode, unspecified: Secondary | ICD-10-CM | POA: Diagnosis not present

## 2018-01-20 DIAGNOSIS — Z79890 Hormone replacement therapy (postmenopausal): Secondary | ICD-10-CM | POA: Insufficient documentation

## 2018-01-20 DIAGNOSIS — Z79899 Other long term (current) drug therapy: Secondary | ICD-10-CM | POA: Insufficient documentation

## 2018-01-20 DIAGNOSIS — F0390 Unspecified dementia without behavioral disturbance: Secondary | ICD-10-CM | POA: Insufficient documentation

## 2018-01-20 DIAGNOSIS — Z7982 Long term (current) use of aspirin: Secondary | ICD-10-CM | POA: Diagnosis not present

## 2018-01-20 DIAGNOSIS — E039 Hypothyroidism, unspecified: Secondary | ICD-10-CM | POA: Insufficient documentation

## 2018-01-20 NOTE — Progress Notes (Signed)
[name], male    DOB: [birth year], 82 y.o.   MRN: [id number]  HPI  Alan Marshall is a 82 y/o male with a history of HTN, CKD, thyroid disease, anemia, atrial fibrillation, vitamin D deficiency, pericardial effusion, GERD, gout, depression and chronic heart failure.   Echo report from 12/05/17 reviewed and showed an EF of 15-20% along with moderate AR/ TR, mild to moderate Alan and a mildly increased PA pressure of 42 mm Hg. Moderate pericardial effusion also noted.   Admitted 11/15/17 due to acute on chronic HF. Initially needed IV lasix and then transitioned to oral diuretics. Cardiology, nephrology and palliative care consults were obtained. Hypotension limiting medication titration. Echo showed pericardial effusion but without tamponade. Elevated troponin thought to be due to demand ischemia. Discharged after 5 days to SNF.  He presents today for a follow-up visit with a chief complaint of moderate shortness of breath upon minimal exertion. He describes this as chronic in nature having been present for several years. He has associated fatigue, pedal edema and difficulty sleeping along with this. He denies any abdominal distention, palpitations, chest pain, cough, dizziness or weight gain. Says that the facility is weighing him daily. Takes metolazone PRN for weight gain (per [facility] Has been adding salt to his food along with using hot sauce and / or mustard.  Past Medical History:  Diagnosis Date  . Anemia   . CHF (congestive heart failure) ([place])   . CKD (chronic kidney disease), stage IV ([place])   . Depression   . GERD (gastroesophageal reflux disease)   . Glaucoma   . Gout   . HFrEF (heart failure with reduced ejection fraction) ([place])    a. 11/2017 Echo: EF <20, diff HK. mild to mod AI. Asc Ao 73mm (mod dil). Mod Alan. Mod dil LA/RV/RA. PASP 86mmHg. Mod to large circumferential pericardial effusion w/o tamponade.  . Hypertension   . Hypothyroidism   . NICM (nonischemic cardiomyopathy)  ([place])    a. S/p prior AICD->extracted in 2015 2/2 MSSA bacteremia; b. s/p Subcutaneous ICD; c. 11/2017 Echo: EF <20%.  . Pericardial effusion    a. 11/2017 Echo: Mod to large circumferential pericardial effusion w/o effusive-constrictive changes or tamponade physiology.  [name] Kitchen Permanent Atrial Fibrillation/Flutter ([place])    a. CHA2DS2VASc = 4-->No OAC 2/2 dementia.  . Vitamin D deficiency    Past Surgical History:  Procedure Laterality Date  . INSERT / REPLACE / REMOVE PACEMAKER     Family History  Problem Relation Age of Onset  . Hypertension Father    Social History   Tobacco Use  . Smoking status: Never Smoker  . Smokeless tobacco: Never Used  Substance Use Topics  . Alcohol use: Not Currently   No Known Allergies  Prior to Admission medications   Medication Sig Start Date End Date Taking? Authorizing Provider  acetaminophen (TYLENOL) 325 MG tablet Take 975 mg by mouth every 8 (eight) hours as needed.   Yes [provider]  allopurinol (ZYLOPRIM) 100 MG tablet Take 0.5 tablets (50 mg total) by mouth daily. 11/20/17  Yes Fritzi Mandes, MD  amiodarone (PACERONE) 100 MG tablet Take 100 mg by mouth daily.   Yes [provider]  aspirin EC 81 MG tablet Take 81 mg by mouth daily.   Yes [provider]  carvedilol (COREG) 3.125 MG tablet Take 1 tablet (3.125 mg total) by mouth 2 (two) times daily with a meal. 11/20/17  Yes Fritzi Mandes, MD  citalopram (CELEXA) 20 MG  tablet Take 20 mg by mouth daily.   Yes [provider]  dorzolamide (TRUSOPT) 2 % ophthalmic solution Place 1 drop into both eyes 2 (two) times daily.   Yes [provider]  ipratropium-albuterol (DUONEB) 0.5-2.5 (3) MG/3ML SOLN Take 3 mLs by nebulization every 6 (six) hours as needed.   Yes [provider]  latanoprost (XALATAN) 0.005 % ophthalmic solution Place 1 drop into both eyes at bedtime.   Yes [provider]  levothyroxine (SYNTHROID, LEVOTHROID) 75 MCG tablet  Take 75 mcg by mouth daily before breakfast.   Yes [provider]  loratadine (CLARITIN) 10 MG tablet Take 10 mg by mouth daily.   Yes [provider]  Melatonin 3 MG TABS Take 1 tablet by mouth at bedtime.   Yes [provider]  metolazone (ZAROXOLYN) 2.5 MG tablet Take 1 tablet (2.5 mg total) by mouth daily as needed (weight gain greater than 5 pounds). 11/20/17  Yes Fritzi Mandes, MD  polyethylene glycol powder (GAVILAX) powder Take 17 g by mouth daily.   Yes [provider]  sennosides-docusate sodium (SENOKOT-S) 8.6-50 MG tablet Take 1 tablet by mouth 2 (two) times daily.   Yes [provider]  torsemide (DEMADEX) 20 MG tablet Take 2 tablets (40 mg total) by mouth 2 (two) times daily. 11/20/17  Yes Fritzi Mandes, MD  albuterol (PROVENTIL) (2.5 MG/3ML) 0.083% nebulizer solution  11/13/17   [provider]  bisacodyl (DULCOLAX) 10 MG suppository Place 10 mg rectally daily as needed for moderate constipation.    [provider]  dicloxacillin (DYNAPEN) 500 MG capsule Take 500 mg by mouth 3 (three) times daily.    [provider]  simethicone (MYLICON) 80 MG chewable tablet Chew 80 mg by mouth every 6 (six) hours as needed for flatulence.    [provider]    Review of Systems  Constitutional: Positive for fatigue. Negative for appetite change.  HENT: Negative for congestion, postnasal drip and sore throat.   Eyes: Negative.   Respiratory: Positive for shortness of breath (with minimal exertion). Negative for cough and chest tightness.   Cardiovascular: Positive for leg swelling (trace right lower leg). Negative for chest pain and palpitations.  Gastrointestinal: Negative for abdominal distention and abdominal pain.  Endocrine: Negative.   Genitourinary: Negative.   Musculoskeletal: Positive for arthralgias (right knee). Negative for back pain.  Skin: Negative.   Allergic/Immunologic: Negative.   Neurological:  Negative for dizziness and light-headedness.  Hematological: Negative for adenopathy. Does not bruise/bleed easily.  Psychiatric/Behavioral: Positive for sleep disturbance (trouble sleeping at night; sleeps better in daytime in wheelchair). Negative for dysphoric mood. The patient is not nervous/anxious.        Memory loss    Vitals:   01/20/18 0853  BP: 96/76  Pulse: 78  Resp: 18  SpO2: 97%  Weight: 197 lb 4 oz (89.5 kg)  Height: 6\' 2"  (1.88 m)   Wt Readings from Last 3 Encounters:  01/20/18 197 lb 4 oz (89.5 kg)  12/09/17 192 lb 6 oz (87.3 kg)  12/05/17 187 lb 4 oz (84.9 kg)   Lab Results  Component Value Date   CREATININE 2.41 (H) 12/05/2017   CREATININE 2.36 (H) 11/19/2017   CREATININE 2.38 (H) 11/18/2017    Physical Exam  Constitutional: He appears well-developed and well-nourished.  HENT:  Head: Normocephalic and atraumatic.  Neck: Normal range of motion. Neck supple. No JVD present.  Cardiovascular: Normal rate. An irregular rhythm present.  Pulmonary/Chest: Effort normal.  No respiratory distress. He has no wheezes. He has no rales.  Abdominal: Soft. He exhibits no distension. There is no tenderness.  Musculoskeletal: He exhibits edema (trace bilateral lower legs). He exhibits no tenderness.  Neurological: He is alert. He displays no tremor.  Skin: Skin is warm and dry.  Psychiatric: He has a normal mood and affect. His behavior is normal.  Nursing note and vitals reviewed.  Assessment & Plan:  1: Chronic heart failure with reduced ejection fraction- - NYHA class III - euvolemic today - being weighed daily at SNF. Reminded to call for an overnight weight gain of >2 pounds or a weekly weight gain of >5 pounds - weight up 5 pounds from previous visit 6 weeks ago but these weights are per SNF as patient is wheelchair bound - has been adding salt to his food along with using hot sauce/ mustard. Discussed the importance of not adding salt to any of his food. Patient  says that he doesn't like Mrs. Deliah Boston - saw cardiology Sharolyn Douglas) 12/05/17 & returns August 2019 - saw pulmonologist Ashby Dawes) 12/23/17 & had left thoracentesis performed 12/25/17 - currently receiving PT at [facility] - BP will not be able to tolerate entresto or titration of carvedilol at this time - BNP 11/15/17 was 2886.0  2: Hypotension- - BP low chronically but patient denies any dizziness - saw PCP Ola Spurr) 10/10/17 - BMP 12/05/17 reviewed and showed sodium 142, potassium 4.0 and GFR 28  3: Atrial fibrillation- - currently on amiodarone and beta-blocker - no anticoagulant secondary to dementia/ comorbidities  Facility medication list was reviewed.  Return here in 3 months or sooner for any questions/problems before then.

## 2018-01-20 NOTE — Patient Instructions (Signed)
Continue weighing daily and call for an overnight weight gain of > 2 pounds or a weekly weight gain of >5 pounds. 

## 2018-01-26 LAB — FUNGAL ORGANISM REFLEX

## 2018-01-26 LAB — FUNGUS CULTURE WITH STAIN

## 2018-01-26 LAB — FUNGUS CULTURE RESULT

## 2018-02-03 ENCOUNTER — Ambulatory Visit (INDEPENDENT_AMBULATORY_CARE_PROVIDER_SITE_OTHER): Payer: Medicare Other | Admitting: Nurse Practitioner

## 2018-02-03 ENCOUNTER — Encounter: Payer: Self-pay | Admitting: Nurse Practitioner

## 2018-02-03 VITALS — BP 90/60 | HR 67 | Ht 74.5 in | Wt 198.4 lb

## 2018-02-03 DIAGNOSIS — N184 Chronic kidney disease, stage 4 (severe): Secondary | ICD-10-CM | POA: Diagnosis not present

## 2018-02-03 DIAGNOSIS — J90 Pleural effusion, not elsewhere classified: Secondary | ICD-10-CM

## 2018-02-03 DIAGNOSIS — I5023 Acute on chronic systolic (congestive) heart failure: Secondary | ICD-10-CM | POA: Diagnosis not present

## 2018-02-03 DIAGNOSIS — I428 Other cardiomyopathies: Secondary | ICD-10-CM | POA: Diagnosis not present

## 2018-02-03 DIAGNOSIS — I313 Pericardial effusion (noninflammatory): Secondary | ICD-10-CM

## 2018-02-03 DIAGNOSIS — I482 Chronic atrial fibrillation, unspecified: Secondary | ICD-10-CM

## 2018-02-03 DIAGNOSIS — I3139 Other pericardial effusion (noninflammatory): Secondary | ICD-10-CM

## 2018-02-03 MED ORDER — METOLAZONE 2.5 MG PO TABS: 2.5000 mg | ORAL_TABLET | ORAL | 3 refills | Status: DC

## 2018-02-03 NOTE — Patient Instructions (Signed)
Medication Instructions:  Your physician has recommended you make the following change in your medication:  1- TAKE Metolazone to 2.5 mg by mouth on Monday, Wednesday and Friday.   Labwork: Your physician recommends that you return for lab work in: [place] (BMET).  Your physician recommends that you return for lab work ON Friday, February 06, 2018 AT [facility].    Testing/Procedures: none  Follow-Up: Your physician recommends that you schedule a follow-up appointment in: [place], NP.  Your physician recommends that you schedule a follow-up appointment in: 4-6 [place] APP.  If you need a refill on your cardiac medications before your next appointment, please call your pharmacy.

## 2018-02-03 NOTE — Progress Notes (Signed)
Office Visit    Patient Name: Alan Marshall Date of Encounter: 02/03/2018  Primary Care Provider:  [facility] [place] Primary Cardiologist:  Kathlyn Sacramento, MD  Chief Complaint    82 year old male with a history of HFrEF, nonischemic cardiomyopathy, stage IV chronic kidney disease, permanent A. fib/flutter, hypothyroidism, dementia, and moderate to large circumferential pericardial effusion, who presents for follow-up.  Past Medical History    Past Medical History:  Diagnosis Date  . Anemia   . CHF (congestive heart failure) ([place])   . CKD (chronic kidney disease), stage IV ([place])   . Depression   . GERD (gastroesophageal reflux disease)   . Glaucoma   . Gout   . HFrEF (heart failure with reduced ejection fraction) ([place])    a. 11/2017 Echo: EF <20, diff HK. mild to mod AI. Asc Ao 45mm (mod dil). Mod MR. Mod dil LA/RV/RA. PASP 62mmHg. Mod to large circumferential pericardial effusion w/o tamponade; b. 12/05/2017 Echo: EF 15-20%, mod AI, mild to mod MR, mod TR, PASP 60mmHg, mod pericardial effusion w/o tamponade.  . Hypertension   . Hypothyroidism   . NICM (nonischemic cardiomyopathy) ([place])    a. S/p prior AICD->extracted in 2015 2/2 MSSA bacteremia; b. s/p Subcutaneous ICD; c. 11/2017 Echo: EF <20%; c. 12/05/2017 Echo: EF 15-20%.  . Pericardial effusion    a. 11/2017 Echo: Mod to large circumferential pericardial effusion w/o effusive-constrictive changes or tamponade physiology; b. 12/05/2017 Echo: EF 15-20%, mod pericardial effusion w/o hemodynamic compromise.  [name] Kitchen Permanent Atrial Fibrillation/Flutter ([place])    a. CHA2DS2VASc = 4-->No OAC 2/2 dementia.  . Recurrent left pleural effusion    a. 11/2017 s/p thoracentesis.  Path neg.  . Vitamin D deficiency    Past Surgical History:  Procedure Laterality Date  . INSERT / REPLACE / REMOVE PACEMAKER      Allergies  No Known Allergies  History of Present Illness    82 year old male with above complex past medical history  including HFrEF and nonischemic cardia myopathy dating back several years.  He was previously followed at [facility], and at one point had an ICD which was subsequently extracted in the setting of MSSA bacteremia in 2015.  He subsequently underwent Sub-Q ICD placement. Other history includes persistent A. fib/flutter, stage IV chronic kidney disease, diabetes, hypertension, hypothyroidism, and dementia.  He was hospitalized at [facility] in June 2019 with dyspnea and volume overload.  EF was less than 20% by echo with moderate mitral regurgitation, mild to moderate AI, moderate to large circumferential pericardial effusion without tamponade, and a pulmonary artery systolic pressure of 60 mmHg.  Diuresis was comp gated by chronic kidney disease but he did have some volume improvement was discharged home at 196 pounds.  He stays at a nursing home and is mostly bed and wheelchair bound.  He was seen in clinic in late June, his weight was down to 187 pounds. F/u echo showed stable, moderate pericardial effusion.  He had persistently poor air movement on the left side and a follow-up chest x-ray showed left greater than right pleural effusions with obscured/opacified left lower lung.  In that setting, he was referred to pulmonology and follow-up chest x-ray showed persistent collapse/consolidation of the left lung.  He then underwent thoracentesis on June 18.  Follow-up chest x-ray continued showed left much worse than right basilar airspace disease-?  Atelectasis versus infection.  Per pulmonology note, high likelihood for recurrence of fluid however, given end-stage heart failure, they would  not drain again unless causing dyspnea. Path was neg.  Since his last visit, he says he has been stable.  He remains basically wheelchair-bound during the day and bedbound in the evenings.  He sleeps with 2 pillows under his head but he also has head of bed up about 30 degrees.  His weight has been stable  between 195 and 198 however he is much more volume overloaded today than he was when I saw him in June.  He has had heart failure clinic visits since then, last on August 13, at which time his lower extremity edema was described as being trace.  Today, he has 3+ edema up to his flanks and scrotal edema as well.  He says his pelvic area has been uncomfortable and he has had difficulty using a urinal in the setting of scrotal edema.  He does have some dyspnea with activity but again, his activity is very limited.  He denies PND, dizziness, syncope, chest pain, palpitations.  He does have early satiety and says he only eats about 1 meal a day.  Home Medications    Prior to Admission medications   Medication Sig Start Date End Date Taking? Authorizing Provider  acetaminophen (TYLENOL) 325 MG tablet Take 975 mg by mouth every 8 (eight) hours as needed.    [provider]  albuterol (PROVENTIL) (2.5 MG/3ML) 0.083% nebulizer solution  11/13/17   [provider]  allopurinol (ZYLOPRIM) 100 MG tablet Take 0.5 tablets (50 mg total) by mouth daily. 11/20/17   Fritzi Mandes, MD  amiodarone (PACERONE) 100 MG tablet Take 100 mg by mouth daily.    [provider]  aspirin EC 81 MG tablet Take 81 mg by mouth daily.    [provider]  bisacodyl (DULCOLAX) 10 MG suppository Place 10 mg rectally daily as needed for moderate constipation.    [provider]  carvedilol (COREG) 3.125 MG tablet Take 1 tablet (3.125 mg total) by mouth 2 (two) times daily with a meal. 11/20/17   Fritzi Mandes, MD  citalopram (CELEXA) 20 MG tablet Take 20 mg by mouth daily.    [provider]  dicloxacillin (DYNAPEN) 500 MG capsule Take 500 mg by mouth 3 (three) times daily.    [provider]  dorzolamide (TRUSOPT) 2 % ophthalmic solution Place 1 drop into both eyes 2 (two) times daily.    [provider]  ipratropium-albuterol (DUONEB) 0.5-2.5 (3) MG/3ML SOLN Take 3 mLs by  nebulization every 6 (six) hours as needed.    [provider]  latanoprost (XALATAN) 0.005 % ophthalmic solution Place 1 drop into both eyes at bedtime.    [provider]  levothyroxine (SYNTHROID, LEVOTHROID) 75 MCG tablet Take 75 mcg by mouth daily before breakfast.    [provider]  loratadine (CLARITIN) 10 MG tablet Take 10 mg by mouth daily.    [provider]  Melatonin 3 MG TABS Take 1 tablet by mouth at bedtime.    [provider]  metolazone (ZAROXOLYN) 2.5 MG tablet Take 1 tablet (2.5 mg total) by mouth daily as needed (weight gain greater than 5 pounds). 11/20/17   Fritzi Mandes, MD  polyethylene glycol powder (GAVILAX) powder Take 17 g by mouth daily.    [provider]  sennosides-docusate sodium (SENOKOT-S) 8.6-50 MG tablet Take 1 tablet by mouth 2 (two) times daily.    [provider]  simethicone (MYLICON) 80 MG chewable tablet Chew 80 mg by mouth every 6 (six) hours  as needed for flatulence.    [provider]  torsemide (DEMADEX) 20 MG tablet Take 2 tablets (40 mg total) by mouth 2 (two) times daily. 11/20/17   Fritzi Mandes, MD    Review of Systems    Significant lower extremity and scrotal edema.  Some degree of dyspnea on exertion though activity is limited.  No chest pain, palpitations, PND, dizziness, syncope.  He does have orthopnea and early satiety..  All other systems reviewed and are otherwise negative except as noted above.  Physical Exam    VS:  BP 90/60 (BP Location: Left Arm, Patient Position: Sitting, Cuff Size: Normal)   Pulse 67   Ht 6' 2.5" (1.892 m)   Wt 198 lb 6 oz (90 kg) Comment: Yesterday's Weight  BMI 25.13 kg/m  , BMI Body mass index is 25.13 kg/m. GEN: Well nourished, well developed, in no acute distress.  HEENT: normal.  Neck: Supple, JVD to jaw, no carotid bruits, or masses. Cardiac: Irregularly irregular, 2/6 systolic murmur throughout.  No rub today. No clubbing, cyanosis.   3+ bilateral lower extremity edema, which extends up to his abdominal flanks.  Significant scrotal edema.  Radials/DP/PT 1 + and equal bilaterally.  Respiratory:  Respirations regular and unlabored, bibasilar crackles.  Improved aeration compared to when I last saw him in June. GI: Soft, nontender, nondistended, BS + x 4. MS: no deformity or atrophy. Skin: warm and dry, no rash. Neuro:  Strength and sensation are intact. Psych: Normal affect.  Accessory Clinical Findings    ECG -atrial fibrillation, 67, left axis, inferior and anterolateral infarcts, nonspecific T changes.  Assessment & Plan    1.  Nonischemic cardiomyopathy/acute on chronic systolic congestive heart failure: Patient is significantly volume overloaded.  When I saw him in June, his weight was down to 187, he is 198 today.  He was 197 when seen in heart failure clinic 2 weeks ago.  Activity is very limited at the skilled nursing facility and he is more or less wheelchair and bedbound.  He does have some dyspnea.  He remains on torsemide 40 twice daily and has a prescription for as needed metolazone.  It is not clear if this is been given though at this point he will require it.  I recommend using metolazone 2.5 mg every Monday, Wednesday, and Friday.  I will follow-up a basic metabolic panel today and he will require a repeat basic metabolic panel this Friday.  We will have to watch his blood pressure closely given baseline relative hypotension.  He does remain on low-dose beta-blocker therapy.  He is not a candidate for ACE/ARB/arni/mra in the setting of relative hypotension and also chronic kidney disease.  He will need to be followed up in heart failure clinic within the next 2 weeks we will plan to see him back in 4 to 6 weeks.  Of note, he does have a subcutaneous AICD and is followed at the [facility].  2.  Moderate to large left pericardial effusion: Follow-up echo in late June showed moderate effusion without tamponade  physiology.  He has not been having significant dyspnea at rest and does not have a rub on exam today.  3.  Left pleural effusion: Status post urology evaluation and thoracentesis in July.  He is moving more air today.  He does have bibasilar crackles in the setting of #1.  Pathology was negative.  If he has recurrence, no plan for additional drainage per previous pulmonology note.  4.  Relative  hypotension: Stable.  5.  A. fib/flutter: Persistent rate controlled on amiodarone and low-dose beta-blocker.  No oral anticoagulations secondary to dementia comorbidities.  6.  Stage IV chronic kidney disease: Follow-up labs today and on Friday.  7.  Disposition: Follow-up in heart failure clinic within the next 2 weeks.  We will see him back in 4 to 6 weeks.   Murray Hodgkins, NP 02/03/2018, 10:19 AM

## 2018-02-04 ENCOUNTER — Telehealth: Payer: Self-pay | Admitting: Nurse Practitioner

## 2018-02-04 LAB — BASIC METABOLIC PANEL
BUN/Creatinine Ratio: 24 (ref 10–24)
BUN: 45 mg/dL — AB (ref 8–27)
CALCIUM: 8.5 mg/dL — AB (ref 8.6–10.2)
CHLORIDE: 99 mmol/L (ref 96–106)
CO2: 27 mmol/L (ref 20–29)
CREATININE: 1.85 mg/dL — AB (ref 0.76–1.27)
GFR calc Af Amer: 39 mL/min/{1.73_m2} — ABNORMAL LOW (ref >59)
GFR calc non Af Amer: 34 mL/min/{1.73_m2} — ABNORMAL LOW (ref >59)
Glucose: 101 mg/dL — ABNORMAL HIGH (ref 65–99)
Potassium: 3.4 mmol/L — ABNORMAL LOW (ref 3.5–5.2)
Sodium: 143 mmol/L (ref 134–144)

## 2018-02-04 NOTE — Telephone Encounter (Signed)
Lab Results  Component Value Date   CREATININE 1.85 (H) 02/03/2018   BUN 45 (H) 02/03/2018   NA 143 02/03/2018   K 3.4 (L) 02/03/2018   CL 99 02/03/2018   CO2 27 02/03/2018    In case this didn't get routed back to triage pool as a result note - Renal fxn stable. K is low. He will need Kdur 14meq bid, since we just added metolazone three days a week. He will also need f/u bmet in a week to reassess. He lives in a Nsg home and staff member told me yesterday that they'd be able to draw that there

## 2018-02-05 NOTE — Telephone Encounter (Signed)
Left voicemail message on POA number listed. Prescription faxed to facility.

## 2018-02-05 NOTE — Telephone Encounter (Signed)
Reviewed recommendations with patients daughter and POA. Orders faxed to facility and she verbalized understanding with no further questions at this time.

## 2018-02-05 NOTE — Telephone Encounter (Signed)
Spoke with Research officer, trade union at nursing facility. Reviewed provider recommendations of potassium and order for repeat labs. She verbalized understanding and requested that we please fax these orders to [phone].

## 2018-02-09 LAB — ACID FAST CULTURE WITH REFLEXED SENSITIVITIES (MYCOBACTERIA): Acid Fast Culture: NEGATIVE

## 2018-02-17 ENCOUNTER — Ambulatory Visit: Payer: Medicare Other | Admitting: Family

## 2018-03-17 ENCOUNTER — Ambulatory Visit: Payer: Medicare Other | Admitting: Nurse Practitioner

## 2018-03-23 ENCOUNTER — Ambulatory Visit: Payer: Medicare Other | Admitting: Internal Medicine

## 2018-03-25 ENCOUNTER — Ambulatory Visit: Payer: Medicare Other | Admitting: Internal Medicine

## 2018-04-06 NOTE — Progress Notes (Signed)
Cardiology Office Note Date:  04/07/2018  Patient ID:  Phuc, Kluttz 1936/03/27, MRN [id number] PCP:  [facility]  Cardiologist:  Dr. Fletcher Anon, MD    Chief Complaint: Follow up HFrEF  History of Present Illness: Alan Marshall is a 82 y.o. male with history of HFrEF secondary to NICM, permanent Afib/flutter not on long term full-dose anticoagulation secondary to dementia, moderate to large circumferential pericardial effusion, CKD stage IV, recurrent left-sided pleural effusion s/p thoracentesis in 11/2017 with negative pathology, diabetes, HTN, hypothyroidism, dementia, and is mostly bed/wheelchair bound who presents for follow up of acute on chronic systolic CHF.   Patient was previously followed by [facility] and the [facility]. One one point, he was status post ICD, though this has since been extracted in the setting of MSSA bacteremia in 2015 followed by subsequent sub-Q ICD that is followed at the [facility]. He was admitted to the hospital in 11/2017 with dyspnea and volume overload. Echo during that admission showed an EF of < 20% with moderate mitral regurgitation, mild to moderate aortic insufficiency, moderate to large circumferential pericardial effusion without tamponade, and a PASP of 60 mmHg. Inpatient diuresis was complicated by CKD, though at discharge he was noted to have some improvement in his volume status with a discharge weight of 196 pounds. He was seen in hospital follow up in late 11/2017 with a weight down to 187 pounds. Follow up echo at that time showed a stable, moderate pericardial effusion. Follow up CXR in the setting of poor air movement on exam showed left greater than right pleural effusions with obscured/opacified left lower lung. In that setting, he was referred to pulmonology with follow up CXR demonstrating persistent collapse/consolidation of the left lung. He underwent thoracentesis in 7/19 with follow up CXR showing continued left much worse than right  basilar airspace disease with pulmonology feeling there was high likelihood of fluid recurrence with recommendation to not drain again in the setting of the patient's end-stage CHF unless it was causing dyspnea. Pathology on the pleural fluid was negative. He was last seen on 8/27 and was stable with 2 pillow orthopnea and the head of the bed up about 30 degrees. His weight at the nursing home ranged between 195-198 pounds. He was noted to be much more volume overloaded than his prior visit in 11/2017 with a weight on 8/27 of 198 pounds (up 12 pounds compared to 11/2017 visit). BP was soft at 90/60. Of note, the patient weighed 197 pounds at his CHF Clinic visit on 01/20/18 with no changes being made.  He was advised to take metolazone 2.5 mg every MWF and was continued on torsemide 40 mg bid. BMET checked on 02/03/18 showed a stable renal function and a BUN/SCr of 45/1.85 (prior SCr 2.3). He was continued on diuresis as above and KCl 40 mEq bid was added given a potassium of 3.4. Follow BMET was advised in 1 week, though I do not see where this was completed.   He comes in today doing quite well.  He remains essentially wheelchair-bound during the day and bedbound in the evenings.  His weight is down 21 pounds from his last office visit in 01/2018 following outpatient diuresis with torsemide 40 mg twice daily and metolazone 2.5 mg Monday Wednesday Friday.  He notes significant improvement in his bilateral lower extremities stating he has a "pencil legs."  His thigh edema, scrotal edema, and flank edema have also resolved.  He has not had any increase in  shortness of breath or chest pain.  He does note some reflux if he eats spicy foods.  Blood pressure has remained soft in the 89Q systolic though is stable.  He denies any PND, dizziness, syncope, or palpitations.  He states he can "eat like a pig."  Past Medical History:  Diagnosis Date  . Anemia   . CHF (congestive heart failure) ([place])   . CKD (chronic kidney  disease), stage IV ([place])   . Depression   . GERD (gastroesophageal reflux disease)   . Glaucoma   . Gout   . HFrEF (heart failure with reduced ejection fraction) ([place])    a. 11/2017 Echo: EF <20, diff HK. mild to mod AI. Asc Ao 68m (mod dil). Mod MR. Mod dil LA/RV/RA. PASP 649mg. Mod to large circumferential pericardial effusion w/o tamponade; b. 12/05/2017 Echo: EF 15-20%, mod AI, mild to mod MR, mod TR, PASP 4260m, mod pericardial effusion w/o tamponade.  . Hypertension   . Hypothyroidism   . NICM (nonischemic cardiomyopathy) ([place]  a. S/p prior AICD->extracted in 2015 2/2 MSSA bacteremia; b. s/p Subcutaneous ICD; c. 11/2017 Echo: EF <20%; c. 12/05/2017 Echo: EF 15-20%.  . Pericardial effusion    a. 11/2017 Echo: Mod to large circumferential pericardial effusion w/o effusive-constrictive changes or tamponade physiology; b. 12/05/2017 Echo: EF 15-20%, mod pericardial effusion w/o hemodynamic compromise.  . PMarland Kitchenrmanent Atrial Fibrillation/Flutter ([facility]  a. CHA2DS2VASc = 4-->No OAC 2/2 dementia.  . Recurrent left pleural effusion    a. 11/2017 s/p thoracentesis.  Path neg.  . Vitamin D deficiency     Past Surgical History:  Procedure Laterality Date  . INSERT / REPLACE / REMOVE PACEMAKER      Current Meds  Medication Sig  . acetaminophen (TYLENOL) 325 MG tablet Take 975 mg by mouth every 8 (eight) hours as needed.  . aMarland Kitchenlopurinol (ZYLOPRIM) 100 MG tablet Take 0.5 tablets (50 mg total) by mouth daily.  . aMarland Kitcheniodarone (PACERONE) 100 MG tablet Take 100 mg by mouth daily.  . aMarland Kitchenpirin EC 81 MG tablet Take 81 mg by mouth daily.  . bisacodyl (DULCOLAX) 10 MG suppository Place 10 mg rectally daily as needed for moderate constipation.  . carvedilol (COREG) 3.125 MG tablet Take 1 tablet (3.125 mg total) by mouth 2 (two) times daily with a meal.  . citalopram (CELEXA) 10 MG tablet Take 10 mg by mouth daily.  . dicloxacillin (DYNAPEN) 500 MG capsule Take 500 mg by mouth 3 (three) times daily.  .  dorzolamide (TRUSOPT) 2 % ophthalmic solution Place 1 drop into both eyes 2 (two) times daily.  . lMarland Kitchentanoprost (XALATAN) 0.005 % ophthalmic solution Place 1 drop into both eyes at bedtime.  . lMarland Kitchenvothyroxine (SYNTHROID, LEVOTHROID) 112 MCG tablet Take 112 mcg by mouth daily before breakfast.  . loratadine (CLARITIN) 10 MG tablet Take 10 mg by mouth daily.  . Melatonin 3 MG TABS Take 1 tablet by mouth at bedtime.  . metolazone (ZAROXOLYN) 2.5 MG tablet Take 1 tablet (2.5 mg total) by mouth every Monday, Wednesday, and Friday.  . polyethylene glycol powder (GAVILAX) powder Take 17 g by mouth daily.  . sennosides-docusate sodium (SENOKOT-S) 8.6-50 MG tablet Take 1 tablet by mouth 2 (two) times daily.  . simethicone (MYLICON) 80 MG chewable tablet Chew 80 mg by mouth every 6 (six) hours as needed for flatulence.  . torsemide (DEMADEX) 20 MG tablet Take 2 tablets (40 mg total) by mouth 2 (two) times daily.    Allergies:  Patient has no known allergies.   Social History:  The patient  reports that he has never smoked. He has never used smokeless tobacco. He reports that he drank alcohol. He reports that he does not use drugs.   Family History:  The patient's family history includes Hypertension in his father.  ROS:   Review of Systems  Constitutional: Positive for malaise/fatigue. Negative for chills, diaphoresis, fever and weight loss.  HENT: Negative for congestion.   Eyes: Negative for discharge and redness.  Respiratory: Negative for cough, hemoptysis, sputum production, shortness of breath and wheezing.   Cardiovascular: Positive for orthopnea. Negative for chest pain, palpitations, claudication, leg swelling and PND.       Stable 2-pillow orthopnea with the head of the bed elevated at 30 degrees.   Gastrointestinal: Negative for abdominal pain, blood in stool, heartburn, melena, nausea and vomiting.  Genitourinary: Negative for hematuria.  Musculoskeletal: Negative for falls and myalgias.    Skin: Negative for rash.  Neurological: Positive for weakness. Negative for dizziness, tingling, tremors, sensory change, speech change, focal weakness and loss of consciousness.  Endo/Heme/Allergies: Does not bruise/bleed easily.  Psychiatric/Behavioral: Negative for substance abuse. The patient is not nervous/anxious.   All other systems reviewed and are negative.    PHYSICAL EXAM:  VS:  BP 98/62 (BP Location: Left Arm, Patient Position: Sitting, Cuff Size: Normal)   Pulse 76   Ht 6' 2.5" (1.892 m)   Wt 177 lb (80.3 kg)   BMI 22.42 kg/m  BMI: Body mass index is 22.42 kg/m.  Physical Exam  Constitutional: He is oriented to person, place, and time. He appears well-developed and well-nourished.  HENT:  Head: Normocephalic and atraumatic.  Eyes: Right eye exhibits no discharge. Left eye exhibits no discharge.  Neck: Normal range of motion. No JVD present.  Cardiovascular: Normal rate, regular rhythm, S1 normal and S2 normal. Exam reveals no distant heart sounds, no friction rub, no midsystolic click and no opening snap.  Murmur heard. II/VI systolic murmur heard throughout   Pulmonary/Chest: Effort normal and breath sounds normal. No respiratory distress. He has no decreased breath sounds. He has no wheezes. He has no rales. He exhibits no tenderness.  Abdominal: Soft. He exhibits no distension. There is no tenderness.  Musculoskeletal: He exhibits no edema.  Right lower extremity wrapped in gauze, though no edema is noted.   Neurological: He is alert and oriented to person, place, and time.  Skin: Skin is warm and dry. No cyanosis. Nails show no clubbing.  Psychiatric: He has a normal mood and affect. His speech is normal and behavior is normal. Judgment and thought content normal.     EKG:  Was ordered and interpreted by me today. Shows NSR, 76 bpm, left axis deviation, 1st degree AV block, LAFB, inferior and anterolateral infarcts, nonspecific st/t changes   Recent  Labs: 11/15/2017: ALT 10; B Natriuretic Peptide 2,886.0 11/17/2017: TSH 8.897 11/19/2017: Hemoglobin 10.3; Platelets 171 02/03/2018: BUN 45; Creatinine, Ser 1.85; Potassium 3.4; Sodium 143  No results found for requested labs within last 8760 hours.   CrCl cannot be calculated (Patient's most recent lab result is older than the maximum 21 days allowed.).   Wt Readings from Last 3 Encounters:  04/07/18 177 lb (80.3 kg)  02/03/18 198 lb 6 oz (90 kg)  01/20/18 197 lb 4 oz (89.5 kg)     Other studies reviewed: Additional studies/records reviewed today include: summarized above  ASSESSMENT AND PLAN:  1. HFrEF secondary to NICM: Patient's  volume status is significantly improved following outpatient diuresis as above.  Weight is down 21 pounds.  We will discontinue scheduled metolazone and advised that he take metolazone 2.5 mg as needed weight gain greater than 3 pounds overnight or 5 pounds in a 1 week time span.  He will remain on torsemide 40 mg twice daily.  Check BMP.  Unfortunately, patient did not have been met checked during his outpatient diuresis as was instructed.  CHF education.  He remains on low-dose carvedilol 3.125 mg twice daily.  He is not on an ACE inhibitor/ARB/spironolactone/Entresto in the setting of relative hypotension and underlying CKD.  2. Moderate to large pericardial effusion: Blood pressure soft though stable.  Asymptomatic.  No rub noted on exam today.  Schedule limited echocardiogram to evaluate pericardial effusion.  3. Left pleural effusion: Status post thoracentesis by pulmonology.  Pulmonology has recommended no repeat intervention unless patient is symptomatic with dyspnea.  Given he is asymptomatic at this time we will continue to monitor.  4. Afib/flutter: Has converted to sinus rhythm with a 1st degree AV block since last seen.  Not on long-term, full dose oral anticoagulation given underlying dementia and comorbid conditions.  Remains on amiodarone and  carvedilol.  5. Relative hypotension: Stable to slightly improved. Precludes escalation of evidence-based heart failure therapy as above.   6. CKD stage IV: Check BMET today.   Disposition: F/u with Dr. Fletcher Anon or an APP in 1 month.   Current medicines are reviewed at length with the patient today.  The patient did not have any concerns regarding medicines.  Signed, Christell Faith, PA-C 04/07/2018 11:06 AM     [facility] [address] [place], [place] [address] [phone]

## 2018-04-06 NOTE — Progress Notes (Signed)
[facility] Consultation      Assessment and Plan:  Pleural effusion. --Likely related to underlying CKD and CHF.  --s/p thoracentesis 12/25/17, results appear tranudative secondary to CHF/CKD.  --Pt is likely going to always have residual pleural effusion, therefore will check a CXR only if his breathing has worsened. If he appears to have significant effusion can repeat thoracentesis.  End-stage congestive heart failure, chronic kidney disease. - Contributing to pleural effusion.  --Continue treatment/diuresis.  --Avoid high salt diet.   Deconditioning/debility. - Patient is wheelchair-bound with minimal functional status.  Return if symptoms worsen or fail to improve.   Date: 04/06/2018  MRN# [id number] Alan Marshall [birth year]   Alan Marshall is a 82 y.o. old male seen in consultation for chief complaint of:    Chief Complaint  Patient presents with  . Follow-up    pt appears to be doing well.    HPI:  The patient is an 82 year old male with a history of severe systolic congestive heart failure, atrial fibrillation, chronic kidney disease, chronic pericardial effusion.  He is presented from his skilled nursing facility, in a motorized wheelchair.  He has been noted to have pleural effusion, he was sent for thoracentesis. Review of results shows transudative pleural effusion, cytology including Flow were negative for malignancy.  Today he is feeling well, breathing is good. He has is wheelchair bound, he does not have dyspnea. He felt significant relief after the thoracentesis on 12/25/17.   **Echo 12/05/17>> EF 15 to 20%. **Chest x-ray 12/25/2017>> in comparison with previous on 12/13/2017, there remains a l left pleural effusion with a significantly elevated left diaphragm. **Chest x-ray 11/27/2017, 11/15/2017>> imaging personally reviewed, there is a left large pleural effusion, unchanged from previous. **Echo report from 12/05/17>> EF of 15-20% along with  moderate AR/ TR, mild to moderate MR and a mildly increased PA pressure of 42 mm Hg. Moderate pericardial effusion also noted.   Medication:    Current Outpatient Medications:  .  acetaminophen (TYLENOL) 325 MG tablet, Take 975 mg by mouth every 8 (eight) hours as needed., Disp: , Rfl:  .  albuterol (PROVENTIL) (2.5 MG/3ML) 0.083% nebulizer solution, , Disp: , Rfl:  .  allopurinol (ZYLOPRIM) 100 MG tablet, Take 0.5 tablets (50 mg total) by mouth daily., Disp: 30 tablet, Rfl: 0 .  amiodarone (PACERONE) 100 MG tablet, Take 100 mg by mouth daily., Disp: , Rfl:  .  aspirin EC 81 MG tablet, Take 81 mg by mouth daily., Disp: , Rfl:  .  bisacodyl (DULCOLAX) 10 MG suppository, Place 10 mg rectally daily as needed for moderate constipation., Disp: , Rfl:  .  carvedilol (COREG) 3.125 MG tablet, Take 1 tablet (3.125 mg total) by mouth 2 (two) times daily with a meal., Disp: 60 tablet, Rfl: 1 .  citalopram (CELEXA) 20 MG tablet, Take 20 mg by mouth daily., Disp: , Rfl:  .  dicloxacillin (DYNAPEN) 500 MG capsule, Take 500 mg by mouth 3 (three) times daily., Disp: , Rfl:  .  dorzolamide (TRUSOPT) 2 % ophthalmic solution, Place 1 drop into both eyes 2 (two) times daily., Disp: , Rfl:  .  ipratropium-albuterol (DUONEB) 0.5-2.5 (3) MG/3ML SOLN, Take 3 mLs by nebulization every 6 (six) hours as needed., Disp: , Rfl:  .  latanoprost (XALATAN) 0.005 % ophthalmic solution, Place 1 drop into both eyes at bedtime., Disp: , Rfl:  .  Levothyroxine Sodium 100 MCG CAPS, Take 75 mcg by mouth daily before breakfast. , Disp: , Rfl:  .  loratadine (CLARITIN) 10 MG tablet, Take 10 mg by mouth daily., Disp: , Rfl:  .  Melatonin 3 MG TABS, Take 1 tablet by mouth at bedtime., Disp: , Rfl:  .  metolazone (ZAROXOLYN) 2.5 MG tablet, Take 1 tablet (2.5 mg total) by mouth every Monday, Wednesday, and Friday., Disp: 45 tablet, Rfl: 3 .  polyethylene glycol powder (GAVILAX) powder, Take 17 g by mouth daily., Disp: , Rfl:  .   sennosides-docusate sodium (SENOKOT-S) 8.6-50 MG tablet, Take 1 tablet by mouth 2 (two) times daily., Disp: , Rfl:  .  simethicone (MYLICON) 80 MG chewable tablet, Chew 80 mg by mouth every 6 (six) hours as needed for flatulence., Disp: , Rfl:  .  torsemide (DEMADEX) 20 MG tablet, Take 2 tablets (40 mg total) by mouth 2 (two) times daily., Disp: 60 tablet, Rfl: 1   Allergies:  Patient has no known allergies.  Review of Systems:  Constitutional: Feels well. Cardiovascular: Denies chest pain, exertional chest pain.  Pulmonary: Denies hemoptysis, pleuritic chest pain.   The remainder of systems were reviewed and were found to be negative other than what is documented in the HPI.    Physical Examination:   VS: BP 90/60 (BP Location: Left Arm, Cuff Size: Normal)   Pulse 73   Resp 16   Ht 6' 2.5" (1.892 m)   SpO2 98%   BMI 22.42 kg/m   General Appearance: No distress  Neuro:without focal findings, mental status, speech normal, alert and oriented HEENT: PERRLA, EOM intact Pulmonary: No wheezing, No rales  CardiovascularNormal S1,S2.  No m/r/g.  Abdomen: Benign, Soft, non-tender, No masses Renal:  No costovertebral tenderness  GU:  No performed at this time. Endoc: No evident thyromegaly, no signs of acromegaly or Cushing features Skin:   warm, no rashes, no ecchymosis  Extremities: normal, no cyanosis, clubbing.       LABORATORY PANEL:   CBC No results for input(s): WBC, HGB, HCT, PLT in the last 168 hours. ------------------------------------------------------------------------------------------------------------------  Chemistries  No results for input(s): NA, K, CL, CO2, GLUCOSE, BUN, CREATININE, CALCIUM, MG, AST, ALT, ALKPHOS, BILITOT in the last 168 hours.  Invalid input(s): GFRCGP ------------------------------------------------------------------------------------------------------------------  Cardiac Enzymes No results for input(s): TROPONINI in the last 168  hours. ------------------------------------------------------------  RADIOLOGY:  No results found.     Thank  you for the consultation and for allowing [facility] to assist in the care of your patient. Our recommendations are noted above.  Please contact us if we can be of further service.   Marda Stalker, M.D., F.C.C.P.  Board Certified in Internal Medicine, Pulmonary Medicine, [place], and Sleep Medicine.  [facility] Office Number: [phone]   04/06/2018

## 2018-04-07 ENCOUNTER — Encounter: Payer: Self-pay | Admitting: Physician Assistant

## 2018-04-07 ENCOUNTER — Encounter: Payer: Self-pay | Admitting: Internal Medicine

## 2018-04-07 ENCOUNTER — Ambulatory Visit (INDEPENDENT_AMBULATORY_CARE_PROVIDER_SITE_OTHER): Payer: Medicare Other | Admitting: Physician Assistant

## 2018-04-07 ENCOUNTER — Ambulatory Visit (INDEPENDENT_AMBULATORY_CARE_PROVIDER_SITE_OTHER): Payer: Medicare Other | Admitting: Internal Medicine

## 2018-04-07 VITALS — BP 90/60 | HR 73 | Resp 16 | Ht 74.5 in

## 2018-04-07 VITALS — BP 98/62 | HR 76 | Ht 74.5 in | Wt 177.0 lb

## 2018-04-07 DIAGNOSIS — J90 Pleural effusion, not elsewhere classified: Secondary | ICD-10-CM

## 2018-04-07 DIAGNOSIS — I5022 Chronic systolic (congestive) heart failure: Secondary | ICD-10-CM

## 2018-04-07 DIAGNOSIS — I4819 Other persistent atrial fibrillation: Secondary | ICD-10-CM

## 2018-04-07 DIAGNOSIS — I313 Pericardial effusion (noninflammatory): Secondary | ICD-10-CM | POA: Diagnosis not present

## 2018-04-07 DIAGNOSIS — I428 Other cardiomyopathies: Secondary | ICD-10-CM | POA: Diagnosis not present

## 2018-04-07 DIAGNOSIS — N184 Chronic kidney disease, stage 4 (severe): Secondary | ICD-10-CM

## 2018-04-07 DIAGNOSIS — I3139 Other pericardial effusion (noninflammatory): Secondary | ICD-10-CM

## 2018-04-07 MED ORDER — METOLAZONE 2.5 MG PO TABS: 2.5000 mg | ORAL_TABLET | ORAL | 3 refills | Status: AC | PRN

## 2018-04-07 NOTE — Patient Instructions (Signed)
Medication Instructions:  CHANGE how you take Metolazone: Take Metolazone 2.5 mg as needed for a weight gain of 5 pounds in one week or 3 pounds overnight.  If you need a refill on your cardiac medications before your next appointment, please call your pharmacy.   Lab work: Your provider would like for you to have the following labs today: BMET  If you have labs (blood work) drawn today and your tests are completely normal, you will receive your results only by: [name] Kitchen MyChart Message (if you have MyChart) OR . A paper copy in the mail If you have any lab test that is abnormal or we need to change your treatment, we will call you to review the results.  Testing/Procedures: Your physician has requested that you have an limited echocardiogram. Echocardiography is a painless test that uses sound waves to create images of your heart. It provides your doctor with information about the size and shape of your heart and how well your heart's chambers and valves are working. You may receive an ultrasound enhancing agent through an IV if needed to better visualize your heart during the echo.This procedure takes approximately one hour. There are no restrictions for this procedure. This will take place at the [facility] clinic.    Follow-Up: At [facility], you and your health needs are our priority.  As part of our continuing mission to provide you with exceptional heart care, we have created designated Provider Care Teams.  These Care Teams include your primary Cardiologist (physician) and Advanced Practice Providers (APPs -  Physician Assistants and Nurse Practitioners) who all work together to provide you with the care you need, when you need it. You will need a follow up appointment in 3 months. You may see Kathlyn Sacramento, MD or one of the following Advanced Practice Providers on your designated Care Team:   Murray Hodgkins, NP Christell Faith, PA-C . Marrianne Mood, PA-C

## 2018-04-07 NOTE — Patient Instructions (Signed)
Continue current medications. If breathing worsens call us back for a repeat Chest xray and possible repeat drainage.

## 2018-04-08 LAB — BASIC METABOLIC PANEL
BUN / CREAT RATIO: 29 — AB (ref 10–24)
BUN: 69 mg/dL — ABNORMAL HIGH (ref 8–27)
CALCIUM: 8.9 mg/dL (ref 8.6–10.2)
CHLORIDE: 93 mmol/L — AB (ref 96–106)
CO2: 26 mmol/L (ref 20–29)
CREATININE: 2.42 mg/dL — AB (ref 0.76–1.27)
GFR calc non Af Amer: 24 mL/min/{1.73_m2} — ABNORMAL LOW (ref >59)
GFR, EST AFRICAN AMERICAN: 28 mL/min/{1.73_m2} — AB (ref >59)
GLUCOSE: 95 mg/dL (ref 65–99)
Potassium: 3.2 mmol/L — ABNORMAL LOW (ref 3.5–5.2)
Sodium: 143 mmol/L (ref 134–144)

## 2018-04-13 ENCOUNTER — Telehealth: Payer: Self-pay | Admitting: Physician Assistant

## 2018-04-13 NOTE — Telephone Encounter (Signed)
I spoke with Chrys Racer, RN at [facility]. She is aware of the patient's BMP results. She has reviewed the patient's chart and confirms there is no potassium on file for him.   I have advised her we will most likely be starting him on a potassium supplement, but will review dosing with Christell Faith, PA.  Per Chrys Racer, an order for any changes can be faxed to [facility] at [phone].  To [place], [place] to review.

## 2018-04-13 NOTE — Telephone Encounter (Signed)
Please have patient take KCl 40 mEq x 1 x 20 mg daily thereafter.  Recommend recheck BMP 1 week after starting potassium supplementation.

## 2018-04-13 NOTE — Telephone Encounter (Signed)
Notes recorded by Rise Mu, PA-C on 04/08/2018 at 11:20 AM EDT Slight bump in BUN/SCr following outpatient diuresis with torsemide and metolazone. His metolazone was changed from scheduled MWF dosing to prn 2.5 mg for weight gain > 3 pounds overnight or 5 pounds in 1 week.  Potassium low at 3.2.   1) Continue torsemide 40 mg bid.  2) Keep metolazone 2.5 as prn for weight gain as noted above.  3) Please verify if he is taking any KCl and if so please find out how much so we can replete his potassium.

## 2018-04-15 NOTE — Telephone Encounter (Signed)
Order faxed to [facility]: [facility] at [phone]  Stating: 1) Start potassium 20 meq- take 2 tablets (40 meq) by mouth x 1 dose today, then take 1 tablet (20 meq) once daily thereafter  2) Repeat a BMP in 1 week & please fax results to our office.   Confirmation received.

## 2018-04-22 ENCOUNTER — Ambulatory Visit: Payer: Medicare Other | Admitting: Family

## 2018-04-22 ENCOUNTER — Encounter: Payer: Self-pay | Admitting: Family

## 2018-04-22 VITALS — BP 100/66 | HR 71 | Resp 18 | Ht 74.0 in | Wt 175.0 lb

## 2018-04-22 DIAGNOSIS — I130 Hypertensive heart and chronic kidney disease with heart failure and stage 1 through stage 4 chronic kidney disease, or unspecified chronic kidney disease: Secondary | ICD-10-CM | POA: Insufficient documentation

## 2018-04-22 DIAGNOSIS — Z79899 Other long term (current) drug therapy: Secondary | ICD-10-CM

## 2018-04-22 DIAGNOSIS — M109 Gout, unspecified: Secondary | ICD-10-CM

## 2018-04-22 DIAGNOSIS — N184 Chronic kidney disease, stage 4 (severe): Secondary | ICD-10-CM | POA: Insufficient documentation

## 2018-04-22 DIAGNOSIS — F329 Major depressive disorder, single episode, unspecified: Secondary | ICD-10-CM | POA: Insufficient documentation

## 2018-04-22 DIAGNOSIS — E559 Vitamin D deficiency, unspecified: Secondary | ICD-10-CM | POA: Insufficient documentation

## 2018-04-22 DIAGNOSIS — Z95810 Presence of automatic (implantable) cardiac defibrillator: Secondary | ICD-10-CM

## 2018-04-22 DIAGNOSIS — D649 Anemia, unspecified: Secondary | ICD-10-CM | POA: Insufficient documentation

## 2018-04-22 DIAGNOSIS — Z993 Dependence on wheelchair: Secondary | ICD-10-CM

## 2018-04-22 DIAGNOSIS — Z79890 Hormone replacement therapy (postmenopausal): Secondary | ICD-10-CM | POA: Insufficient documentation

## 2018-04-22 DIAGNOSIS — I428 Other cardiomyopathies: Secondary | ICD-10-CM | POA: Insufficient documentation

## 2018-04-22 DIAGNOSIS — I959 Hypotension, unspecified: Secondary | ICD-10-CM | POA: Insufficient documentation

## 2018-04-22 DIAGNOSIS — K219 Gastro-esophageal reflux disease without esophagitis: Secondary | ICD-10-CM | POA: Insufficient documentation

## 2018-04-22 DIAGNOSIS — I5022 Chronic systolic (congestive) heart failure: Secondary | ICD-10-CM

## 2018-04-22 DIAGNOSIS — I950 Idiopathic hypotension: Secondary | ICD-10-CM

## 2018-04-22 DIAGNOSIS — Z7982 Long term (current) use of aspirin: Secondary | ICD-10-CM

## 2018-04-22 DIAGNOSIS — F0390 Unspecified dementia without behavioral disturbance: Secondary | ICD-10-CM | POA: Insufficient documentation

## 2018-04-22 DIAGNOSIS — E039 Hypothyroidism, unspecified: Secondary | ICD-10-CM | POA: Insufficient documentation

## 2018-04-22 DIAGNOSIS — H409 Unspecified glaucoma: Secondary | ICD-10-CM

## 2018-04-22 DIAGNOSIS — Z8249 Family history of ischemic heart disease and other diseases of the circulatory system: Secondary | ICD-10-CM | POA: Insufficient documentation

## 2018-04-22 NOTE — Progress Notes (Signed)
[name], male    DOB: 05-31-1936, 82 y.o.   MRN: [id number]  HPI  Alan Marshall is a 82 y/o male with a history of HTN, CKD, thyroid disease, anemia, atrial fibrillation, vitamin D deficiency, pericardial effusion, GERD, gout, depression and chronic heart failure.   Echo report from 12/05/17 reviewed and showed an EF of 15-20% along with moderate AR/ TR, mild to moderate Alan and a mildly increased PA pressure of 42 mm Hg. Moderate pericardial effusion also noted.   Admitted 11/15/17 due to acute on chronic HF. Initially needed IV lasix and then transitioned to oral diuretics. Cardiology, nephrology and palliative care consults were obtained. Hypotension limiting medication titration. Echo showed pericardial effusion but without tamponade. Elevated troponin thought to be due to demand ischemia. Discharged after 5 days to SNF.  He presents today for a follow-up visit with a chief complaint of moderate shortness of breath upon minimal exertion. He describes this as chronic in nature having been present for several years. He has associated fatigue, knee pain and chronic difficulty sleeping along with this. He denies any abdominal distention, palpitations, pedal edema, chest pain, cough, dizziness or weight gain. Currently has a dressing over his right lower leg.   Past Medical History:  Diagnosis Date  . Anemia   . CHF (congestive heart failure) ([place])   . CKD (chronic kidney disease), stage IV ([place])   . Depression   . GERD (gastroesophageal reflux disease)   . Glaucoma   . Gout   . HFrEF (heart failure with reduced ejection fraction) ([place])    a. 11/2017 Echo: EF <20, diff HK. mild to mod AI. Asc Ao 66mm (mod dil). Mod Alan. Mod dil LA/RV/RA. PASP 46mmHg. Mod to large circumferential pericardial effusion w/o tamponade; b. 12/05/2017 Echo: EF 15-20%, mod AI, mild to mod Alan, mod TR, PASP 41mmHg, mod pericardial effusion w/o tamponade.  . Hypertension   . Hypothyroidism   . NICM (nonischemic  cardiomyopathy) ([place])    a. S/p prior AICD->extracted in 2015 2/2 MSSA bacteremia; b. s/p Subcutaneous ICD; c. 11/2017 Echo: EF <20%; c. 12/05/2017 Echo: EF 15-20%.  . Pericardial effusion    a. 11/2017 Echo: Mod to large circumferential pericardial effusion w/o effusive-constrictive changes or tamponade physiology; b. 12/05/2017 Echo: EF 15-20%, mod pericardial effusion w/o hemodynamic compromise.  [name] Kitchen Permanent Atrial Fibrillation/Flutter ([place])    a. CHA2DS2VASc = 4-->No OAC 2/2 dementia.  . Recurrent left pleural effusion    a. 11/2017 s/p thoracentesis.  Path neg.  . Vitamin D deficiency    Past Surgical History:  Procedure Laterality Date  . INSERT / REPLACE / REMOVE PACEMAKER     Family History  Problem Relation Age of Onset  . Hypertension Father    Social History   Tobacco Use  . Smoking status: Never Smoker  . Smokeless tobacco: Never Used  Substance Use Topics  . Alcohol use: Not Currently   No Known Allergies  Prior to Admission medications   Medication Sig Start Date End Date Taking? Authorizing Provider  acetaminophen (TYLENOL) 325 MG tablet Take 975 mg by mouth every 8 (eight) hours as needed.   Yes [provider]  allopurinol (ZYLOPRIM) 100 MG tablet Take 0.5 tablets (50 mg total) by mouth daily. 11/20/17  Yes Fritzi Mandes, MD  amiodarone (PACERONE) 100 MG tablet Take 100 mg by mouth daily.   Yes [provider]  aspirin EC 81 MG tablet Take 81 mg by mouth daily.   Yes [provider]  bisacodyl (DULCOLAX) 10 MG suppository Place 10 mg rectally daily as needed for moderate constipation.   Yes [provider]  carvedilol (COREG) 3.125 MG tablet Take 1 tablet (3.125 mg total) by mouth 2 (two) times daily with a meal. 11/20/17  Yes Fritzi Mandes, MD  citalopram (CELEXA) 10 MG tablet Take 10 mg by mouth daily.   Yes [provider]  dicloxacillin (DYNAPEN) 500 MG capsule Take 500 mg by mouth 3 (three) times daily.   Yes [provider]  dorzolamide (TRUSOPT) 2 % ophthalmic solution Place 1 drop into both eyes 2 (two) times daily.   Yes [provider]  ipratropium-albuterol (DUONEB) 0.5-2.5 (3) MG/3ML SOLN Take 3 mLs by nebulization every 6 (six) hours as needed.   Yes [provider]  latanoprost (XALATAN) 0.005 % ophthalmic solution Place 1 drop into both eyes at bedtime.   Yes [provider]  levothyroxine (SYNTHROID, LEVOTHROID) 112 MCG tablet Take 112 mcg by mouth daily before breakfast.   Yes [provider]  loratadine (CLARITIN) 10 MG tablet Take 10 mg by mouth daily.   Yes [provider]  Melatonin 3 MG TABS Take 1 tablet by mouth at bedtime.   Yes [provider]  metolazone (ZAROXOLYN) 2.5 MG tablet Take 1 tablet (2.5 mg total) by mouth as needed. For a weight gain of 3 pounds overnight or 5 pounds in one week 04/07/18 07/06/18 Yes Dunn, Areta Haber, PA-C  polyethylene glycol powder (GAVILAX) powder Take 17 g by mouth daily.   Yes [provider]  potassium chloride SA (K-DUR,KLOR-CON) 20 MEQ tablet Take 20 mEq by mouth daily.   Yes [provider]  sennosides-docusate sodium (SENOKOT-S) 8.6-50 MG tablet Take 1 tablet by mouth 2 (two) times daily.   Yes [provider]  simethicone (MYLICON) 80 MG chewable tablet Chew 80 mg by mouth every 6 (six) hours as needed for flatulence.   Yes [provider]  Skin Protectants, Misc. (EUCERIN) cream Apply 1 application topically 2 (two) times daily.   Yes [provider]  torsemide (DEMADEX) 20 MG tablet Take 2 tablets (40 mg total) by mouth 2 (two) times daily. 11/20/17  Yes Fritzi Mandes, MD    Review of Systems  Constitutional: Positive for fatigue. Negative for appetite change.  HENT: Negative for congestion, postnasal drip and sore throat.   Eyes: Negative.   Respiratory: Positive for shortness of breath (with minimal exertion). Negative for cough and chest  tightness.   Cardiovascular: Negative for chest pain, palpitations and leg swelling.  Gastrointestinal: Negative for abdominal distention and abdominal pain.  Endocrine: Negative.   Genitourinary: Negative.   Musculoskeletal: Positive for arthralgias (right knee). Negative for back pain.  Skin: Negative.   Allergic/Immunologic: Negative.   Neurological: Negative for dizziness and light-headedness.  Hematological: Negative for adenopathy. Does not bruise/bleed easily.  Psychiatric/Behavioral: Positive for sleep disturbance (trouble sleeping at night; sleeps better in daytime in wheelchair). Negative for dysphoric mood. The patient is not nervous/anxious.        Memory loss   Vitals:   04/22/18 0953  BP: 100/66  Pulse: 71  Resp: 18  SpO2: 100%  Weight: 175 lb (79.4 kg)  Height: 6\' 2"  (1.88 m)   Wt Readings from Last 3 Encounters:  04/22/18 175 lb (79.4 kg)  04/07/18 177 lb (80.3 kg)  02/03/18 198 lb 6 oz (90 kg)   Lab Results  Component Value Date   CREATININE 2.42 (H) 04/07/2018  CREATININE 1.85 (H) 02/03/2018   CREATININE 2.41 (H) 12/05/2017   Physical Exam  Constitutional: He appears well-developed and well-nourished.  HENT:  Head: Normocephalic and atraumatic.  Neck: Normal range of motion. Neck supple. No JVD present.  Cardiovascular: Normal rate. An irregular rhythm present.  Pulmonary/Chest: Effort normal. No respiratory distress. He has no wheezes. He has no rales.  Abdominal: Soft. He exhibits no distension. There is no tenderness.  Musculoskeletal: He exhibits no edema or tenderness.  Neurological: He is alert. He displays no tremor.  Skin: Skin is warm and dry.  Psychiatric: He has a normal mood and affect. His behavior is normal.  Nursing note and vitals reviewed.  Assessment & Plan:  1: Chronic heart failure with reduced ejection fraction- - NYHA class III - euvolemic today - being weighed daily at SNF. Reminded to call for an overnight weight gain of  >2 pounds or a weekly weight gain of >5 pounds - weight 175 lb per SNF as patient is wheelchair bound - has been adding salt to his food along with using hot sauce/ mustard. Discussed the importance of not adding salt to any of his food. Patient says that he doesn't like Mrs. Deliah Boston - saw cardiology (Dunn) 04/07/18 - saw pulmonologist Ashby Dawes) 04/07/18 - currently receiving PT at [facility] - BP will not be able to tolerate entresto or titration of carvedilol at this time - BNP 11/15/17 was 2886.0  2: Hypotension- - BP looks good although remains on the low side - saw PCP Ola Spurr) 10/10/17 - BMP 04/07/18 reviewed and showed sodium 143, potassium 3.2, creatinine 2.42 and GFR 28 (potassium was started by cardiology)  Facility medication list was reviewed.  Return in 6 months or sooner for any questions/problems before then.

## 2018-04-22 NOTE — Patient Instructions (Signed)
Continue weighing daily and call for an overnight weight gain of > 2 pounds or a weekly weight gain of >5 pounds. 

## 2018-04-23 ENCOUNTER — Ambulatory Visit (INDEPENDENT_AMBULATORY_CARE_PROVIDER_SITE_OTHER): Payer: Medicare Other

## 2018-04-23 DIAGNOSIS — I313 Pericardial effusion (noninflammatory): Secondary | ICD-10-CM

## 2018-04-25 ENCOUNTER — Other Ambulatory Visit: Payer: Self-pay

## 2018-04-25 ENCOUNTER — Inpatient Hospital Stay
Admission: EM | Admit: 2018-04-25 | Discharge: 2018-04-27 | DRG: 378 | Disposition: A | Discharge status: Skilled Nursing Facility | Payer: Medicare Other | Attending: Internal Medicine | Admitting: Internal Medicine

## 2018-04-25 ENCOUNTER — Other Ambulatory Visit
Admission: RE | Admit: 2018-04-25 | Discharge: 2018-04-25 | Disposition: A | Discharge status: Home / Self Care | Payer: Medicare Other | Source: Ambulatory Visit | Attending: Family Medicine | Admitting: Family Medicine

## 2018-04-25 DIAGNOSIS — K922 Gastrointestinal hemorrhage, unspecified: Secondary | ICD-10-CM | POA: Diagnosis not present

## 2018-04-25 DIAGNOSIS — F0390 Unspecified dementia without behavioral disturbance: Secondary | ICD-10-CM | POA: Diagnosis present

## 2018-04-25 DIAGNOSIS — I4892 Unspecified atrial flutter: Secondary | ICD-10-CM | POA: Diagnosis present

## 2018-04-25 DIAGNOSIS — Z993 Dependence on wheelchair: Secondary | ICD-10-CM | POA: Diagnosis not present

## 2018-04-25 DIAGNOSIS — Z8249 Family history of ischemic heart disease and other diseases of the circulatory system: Secondary | ICD-10-CM

## 2018-04-25 DIAGNOSIS — D62 Acute posthemorrhagic anemia: Secondary | ICD-10-CM | POA: Diagnosis not present

## 2018-04-25 DIAGNOSIS — M109 Gout, unspecified: Secondary | ICD-10-CM | POA: Diagnosis present

## 2018-04-25 DIAGNOSIS — N184 Chronic kidney disease, stage 4 (severe): Secondary | ICD-10-CM | POA: Diagnosis present

## 2018-04-25 DIAGNOSIS — Z8619 Personal history of other infectious and parasitic diseases: Secondary | ICD-10-CM

## 2018-04-25 DIAGNOSIS — Z7982 Long term (current) use of aspirin: Secondary | ICD-10-CM | POA: Diagnosis not present

## 2018-04-25 DIAGNOSIS — I420 Dilated cardiomyopathy: Secondary | ICD-10-CM | POA: Diagnosis not present

## 2018-04-25 DIAGNOSIS — D509 Iron deficiency anemia, unspecified: Secondary | ICD-10-CM | POA: Diagnosis present

## 2018-04-25 DIAGNOSIS — I440 Atrioventricular block, first degree: Secondary | ICD-10-CM | POA: Diagnosis present

## 2018-04-25 DIAGNOSIS — Z79890 Hormone replacement therapy (postmenopausal): Secondary | ICD-10-CM

## 2018-04-25 DIAGNOSIS — K219 Gastro-esophageal reflux disease without esophagitis: Secondary | ICD-10-CM | POA: Diagnosis present

## 2018-04-25 DIAGNOSIS — I313 Pericardial effusion (noninflammatory): Secondary | ICD-10-CM | POA: Diagnosis present

## 2018-04-25 DIAGNOSIS — Z029 Encounter for administrative examinations, unspecified: Secondary | ICD-10-CM | POA: Insufficient documentation

## 2018-04-25 DIAGNOSIS — Z95810 Presence of automatic (implantable) cardiac defibrillator: Secondary | ICD-10-CM | POA: Diagnosis not present

## 2018-04-25 DIAGNOSIS — E1122 Type 2 diabetes mellitus with diabetic chronic kidney disease: Secondary | ICD-10-CM | POA: Diagnosis present

## 2018-04-25 DIAGNOSIS — C801 Malignant (primary) neoplasm, unspecified: Secondary | ICD-10-CM | POA: Diagnosis not present

## 2018-04-25 DIAGNOSIS — J90 Pleural effusion, not elsewhere classified: Secondary | ICD-10-CM | POA: Diagnosis present

## 2018-04-25 DIAGNOSIS — I130 Hypertensive heart and chronic kidney disease with heart failure and stage 1 through stage 4 chronic kidney disease, or unspecified chronic kidney disease: Secondary | ICD-10-CM | POA: Diagnosis present

## 2018-04-25 DIAGNOSIS — I5084 End stage heart failure: Secondary | ICD-10-CM | POA: Diagnosis present

## 2018-04-25 DIAGNOSIS — E039 Hypothyroidism, unspecified: Secondary | ICD-10-CM | POA: Diagnosis present

## 2018-04-25 DIAGNOSIS — I4821 Permanent atrial fibrillation: Secondary | ICD-10-CM | POA: Diagnosis present

## 2018-04-25 DIAGNOSIS — K921 Melena: Secondary | ICD-10-CM | POA: Diagnosis present

## 2018-04-25 DIAGNOSIS — I083 Combined rheumatic disorders of mitral, aortic and tricuspid valves: Secondary | ICD-10-CM | POA: Diagnosis present

## 2018-04-25 DIAGNOSIS — H409 Unspecified glaucoma: Secondary | ICD-10-CM | POA: Diagnosis present

## 2018-04-25 DIAGNOSIS — Z79899 Other long term (current) drug therapy: Secondary | ICD-10-CM

## 2018-04-25 DIAGNOSIS — I5022 Chronic systolic (congestive) heart failure: Secondary | ICD-10-CM | POA: Diagnosis present

## 2018-04-25 DIAGNOSIS — I428 Other cardiomyopathies: Secondary | ICD-10-CM | POA: Diagnosis present

## 2018-04-25 DIAGNOSIS — D500 Iron deficiency anemia secondary to blood loss (chronic): Secondary | ICD-10-CM | POA: Diagnosis not present

## 2018-04-25 DIAGNOSIS — I4891 Unspecified atrial fibrillation: Secondary | ICD-10-CM | POA: Diagnosis present

## 2018-04-25 LAB — BASIC METABOLIC PANEL
Anion gap: 8 (ref 5–15)
BUN: 81 mg/dL — AB (ref 8–23)
CO2: 30 mmol/L (ref 22–32)
Calcium: 8.8 mg/dL — ABNORMAL LOW (ref 8.9–10.3)
Chloride: 103 mmol/L (ref 98–111)
Creatinine, Ser: 2.12 mg/dL — ABNORMAL HIGH (ref 0.61–1.24)
GFR, EST AFRICAN AMERICAN: 32 mL/min — AB (ref >60)
GFR, EST NON AFRICAN AMERICAN: 28 mL/min — AB (ref >60)
Glucose, Bld: 83 mg/dL (ref 70–99)
POTASSIUM: 4.9 mmol/L (ref 3.5–5.1)
SODIUM: 141 mmol/L (ref 135–145)

## 2018-04-25 LAB — CBC WITH DIFFERENTIAL/PLATELET
ABS IMMATURE GRANULOCYTES: 0.06 10*3/uL (ref 0.00–0.07)
Abs Immature Granulocytes: 0.03 10*3/uL (ref 0.00–0.07)
BASOS ABS: 0.1 10*3/uL (ref 0.0–0.1)
BASOS PCT: 1 %
BASOS PCT: 1 %
Basophils Absolute: 0.1 10*3/uL (ref 0.0–0.1)
EOS ABS: 0.6 10*3/uL — AB (ref 0.0–0.5)
EOS PCT: 9 %
Eosinophils Absolute: 0.6 10*3/uL — ABNORMAL HIGH (ref 0.0–0.5)
Eosinophils Relative: 9 %
HCT: 26.5 % — ABNORMAL LOW (ref 39.0–52.0)
HCT: 26.8 % — ABNORMAL LOW (ref 39.0–52.0)
Hemoglobin: 8.3 g/dL — ABNORMAL LOW (ref 13.0–17.0)
Hemoglobin: 8.4 g/dL — ABNORMAL LOW (ref 13.0–17.0)
Immature Granulocytes: 1 %
Immature Granulocytes: 1 %
LYMPHS ABS: 1.2 10*3/uL (ref 0.7–4.0)
Lymphocytes Relative: 18 %
Lymphocytes Relative: 20 %
Lymphs Abs: 1.2 10*3/uL (ref 0.7–4.0)
MCH: 30 pg (ref 26.0–34.0)
MCH: 30.1 pg (ref 26.0–34.0)
MCHC: 31.3 g/dL (ref 30.0–36.0)
MCHC: 31.3 g/dL (ref 30.0–36.0)
MCV: 95.7 fL (ref 80.0–100.0)
MCV: 96.1 fL (ref 80.0–100.0)
MONO ABS: 0.8 10*3/uL (ref 0.1–1.0)
MONO ABS: 0.7 10*3/uL (ref 0.1–1.0)
Monocytes Relative: 12 %
Monocytes Relative: 11 %
NEUTROS ABS: 3.9 10*3/uL (ref 1.7–7.7)
NRBC: 0 % (ref 0.0–0.2)
Neutro Abs: 3.7 10*3/uL (ref 1.7–7.7)
Neutrophils Relative %: 59 %
Neutrophils Relative %: 58 %
PLATELETS: 200 10*3/uL (ref 150–400)
PLATELETS: 189 10*3/uL (ref 150–400)
RBC: 2.77 MIL/uL — AB (ref 4.22–5.81)
RBC: 2.79 MIL/uL — ABNORMAL LOW (ref 4.22–5.81)
RDW: 13.4 % (ref 11.5–15.5)
RDW: 13.4 % (ref 11.5–15.5)
WBC: 6.5 10*3/uL (ref 4.0–10.5)
WBC: 6.2 10*3/uL (ref 4.0–10.5)
nRBC: 0 % (ref 0.0–0.2)

## 2018-04-25 LAB — COMPREHENSIVE METABOLIC PANEL
ALBUMIN: 2.7 g/dL — AB (ref 3.5–5.0)
ALK PHOS: 79 U/L (ref 38–126)
ALT: 8 U/L (ref 0–44)
ANION GAP: 10 (ref 5–15)
AST: 16 U/L (ref 15–41)
BUN: 78 mg/dL — ABNORMAL HIGH (ref 8–23)
CALCIUM: 8.8 mg/dL — AB (ref 8.9–10.3)
CHLORIDE: 101 mmol/L (ref 98–111)
CO2: 28 mmol/L (ref 22–32)
CREATININE: 2.02 mg/dL — AB (ref 0.61–1.24)
GFR calc Af Amer: 34 mL/min — ABNORMAL LOW (ref >60)
GFR calc non Af Amer: 29 mL/min — ABNORMAL LOW (ref >60)
GLUCOSE: 98 mg/dL (ref 70–99)
Potassium: 5 mmol/L (ref 3.5–5.1)
SODIUM: 139 mmol/L (ref 135–145)
Total Bilirubin: 0.5 mg/dL (ref 0.3–1.2)
Total Protein: 7.3 g/dL (ref 6.5–8.1)

## 2018-04-25 LAB — PROTIME-INR
INR: 1.17
Prothrombin Time: 14.8 seconds (ref 11.4–15.2)

## 2018-04-25 LAB — HEMOGLOBIN: Hemoglobin: 8.4 g/dL — ABNORMAL LOW (ref 13.0–17.0)

## 2018-04-25 LAB — APTT: APTT: 41 s — AB (ref 24–36)

## 2018-04-25 MED ORDER — ALLOPURINOL 100 MG PO TABS
50.0000 mg | ORAL_TABLET | Freq: Every day | ORAL | Status: DC
  Administered 2018-04-26 – 2018-04-27 (×2): 50 mg via ORAL
  Filled 2018-04-25 (×2): qty 1

## 2018-04-25 MED ORDER — BISACODYL 10 MG RE SUPP: 10.0000 mg | Freq: Every day | RECTAL | Status: DC | PRN

## 2018-04-25 MED ORDER — ACETAMINOPHEN 650 MG RE SUPP: 650.0000 mg | Freq: Four times a day (QID) | RECTAL | Status: DC | PRN

## 2018-04-25 MED ORDER — CARVEDILOL 6.25 MG PO TABS
3.1250 mg | ORAL_TABLET | Freq: Two times a day (BID) | ORAL | Status: DC
  Administered 2018-04-26 – 2018-04-27 (×2): 3.125 mg via ORAL
  Filled 2018-04-25 (×2): qty 1

## 2018-04-25 MED ORDER — LEVOTHYROXINE SODIUM 112 MCG PO TABS
112.0000 ug | ORAL_TABLET | Freq: Every day | ORAL | Status: DC
  Administered 2018-04-26 – 2018-04-27 (×2): 112 ug via ORAL
  Filled 2018-04-25 (×2): qty 1

## 2018-04-25 MED ORDER — TORSEMIDE 20 MG PO TABS
40.0000 mg | ORAL_TABLET | Freq: Two times a day (BID) | ORAL | Status: DC
  Administered 2018-04-26 – 2018-04-27 (×2): 40 mg via ORAL
  Filled 2018-04-25 (×4): qty 2

## 2018-04-25 MED ORDER — SIMETHICONE 80 MG PO CHEW
80.0000 mg | CHEWABLE_TABLET | Freq: Four times a day (QID) | ORAL | Status: DC | PRN
  Filled 2018-04-25: qty 1

## 2018-04-25 MED ORDER — AMIODARONE HCL 200 MG PO TABS
100.0000 mg | ORAL_TABLET | Freq: Every day | ORAL | Status: DC
  Administered 2018-04-26 – 2018-04-27 (×2): 100 mg via ORAL
  Filled 2018-04-25 (×2): qty 1

## 2018-04-25 MED ORDER — SENNOSIDES-DOCUSATE SODIUM 8.6-50 MG PO TABS
ORAL_TABLET | Freq: Two times a day (BID) | ORAL | Status: DC
  Administered 2018-04-26 (×2): 1 via ORAL
  Filled 2018-04-25 (×3): qty 1

## 2018-04-25 MED ORDER — ONDANSETRON HCL 4 MG/2ML IJ SOLN: 4.0000 mg | Freq: Four times a day (QID) | INTRAMUSCULAR | Status: DC | PRN

## 2018-04-25 MED ORDER — POLYETHYLENE GLYCOL 3350 17 G PO PACK: 17.0000 g | PACK | Freq: Every day | ORAL | Status: DC

## 2018-04-25 MED ORDER — IPRATROPIUM-ALBUTEROL 0.5-2.5 (3) MG/3ML IN SOLN: 3.0000 mL | Freq: Four times a day (QID) | RESPIRATORY_TRACT | Status: DC | PRN

## 2018-04-25 MED ORDER — SODIUM CHLORIDE 0.9 % IV BOLUS
500.0000 mL | Freq: Once | INTRAVENOUS | Status: AC
  Administered 2018-04-25: 500 mL via INTRAVENOUS

## 2018-04-25 MED ORDER — CITALOPRAM HYDROBROMIDE 20 MG PO TABS
10.0000 mg | ORAL_TABLET | Freq: Every day | ORAL | Status: DC
  Administered 2018-04-26 – 2018-04-27 (×2): 10 mg via ORAL
  Filled 2018-04-25 (×2): qty 1

## 2018-04-25 MED ORDER — LORATADINE 10 MG PO TABS
10.0000 mg | ORAL_TABLET | Freq: Every day | ORAL | Status: DC
  Administered 2018-04-26 – 2018-04-27 (×2): 10 mg via ORAL
  Filled 2018-04-25 (×2): qty 1

## 2018-04-25 MED ORDER — DORZOLAMIDE HCL 2 % OP SOLN
1.0000 [drp] | Freq: Two times a day (BID) | OPHTHALMIC | Status: DC
  Administered 2018-04-26 – 2018-04-27 (×4): 1 [drp] via OPHTHALMIC
  Filled 2018-04-25: qty 10

## 2018-04-25 MED ORDER — ONDANSETRON HCL 4 MG PO TABS: 4.0000 mg | ORAL_TABLET | Freq: Four times a day (QID) | ORAL | Status: DC | PRN

## 2018-04-25 MED ORDER — SODIUM CHLORIDE 0.9 % IV SOLN
INTRAVENOUS | Status: DC
  Administered 2018-04-25: via INTRAVENOUS

## 2018-04-25 MED ORDER — LATANOPROST 0.005 % OP SOLN
1.0000 [drp] | Freq: Every day | OPHTHALMIC | Status: DC
  Administered 2018-04-26 (×2): 1 [drp] via OPHTHALMIC
  Filled 2018-04-25: qty 2.5

## 2018-04-25 MED ORDER — MELATONIN 5 MG PO TABS
2.5000 mg | ORAL_TABLET | Freq: Every day | ORAL | Status: DC
  Administered 2018-04-26 (×2): 2.5 mg via ORAL
  Filled 2018-04-25 (×3): qty 0.5

## 2018-04-25 MED ORDER — ACETAMINOPHEN 325 MG PO TABS: 650.0000 mg | ORAL_TABLET | Freq: Four times a day (QID) | ORAL | Status: DC | PRN

## 2018-04-25 NOTE — ED Notes (Signed)
2nd attempt at calling report: told 2nd time that no clean beds are currently available.

## 2018-04-25 NOTE — ED Notes (Signed)
Charge RN notified of hold on floor report.

## 2018-04-25 NOTE — ED Triage Notes (Signed)
Pt brought in via EMS from [facility] d/t bloody stool and dec hgb per [facility] staff. EMS reports vitals WDL: 116/79; 96%; HR 70-80; A&Ox4.

## 2018-04-25 NOTE — Progress Notes (Signed)
Patient admitted to floor with the following skin conditions to left leg: 1 above knee, lateral side, open area with purulent drainage covered by pink foam; 1 upper shin open area with dried drainage covered by Vaseline gauze, kerlix, and abd; 1 mid shin open area with dried drainage covered by Vaseline gauze, kerlix, and abd; 2 lower shin, one lateral and one medial, open areas with dried drainage, covered with Vaseline gauze, kerlix, and abd; 1 lower knee healing scab covered with pink foam; 1 lateral ankle dry healing scab covered with pink foam.

## 2018-04-25 NOTE — ED Notes (Signed)
Per Musician, will be notified when floor room is available. Currently still being cleaned.

## 2018-04-25 NOTE — ED Notes (Signed)
Pt sleeping. 

## 2018-04-25 NOTE — ED Provider Notes (Signed)
[facility] Emergency Department Provider Note  ____________________________________________  Time seen: Approximately 7:26 PM  I have reviewed the triage vital signs and the nursing notes.   HISTORY  Chief Complaint Rectal Bleeding    HPI Alan Marshall is a 82 y.o. male with a history of CHF CKD systolic heart failure hypertension and atrial fibrillation who is brought to the ED due to bright red blood per rectum earlier today from [facility].  They also obtained outpatient labs today which showed a hemoglobin of 8.3 compared to a baseline of 12.  Concern for acute GI bleed.  Patient attributed to eating beets and hot sauce recently.  He denies chest pain shortness of breath dizziness or syncope.  Denies abdominal pain.  No blood thinner use.  He is on baby aspirin daily.   No aggravating or alleviating factors.  Patient denies a history of hemorrhoids or constipation.  No nausea or vomiting.  No radiating pain.     Past Medical History:  Diagnosis Date  . Anemia   . CHF (congestive heart failure) ([place])   . CKD (chronic kidney disease), stage IV ([place])   . Depression   . GERD (gastroesophageal reflux disease)   . Glaucoma   . Gout   . HFrEF (heart failure with reduced ejection fraction) ([place])    a. 11/2017 Echo: EF <20, diff HK. mild to mod AI. Asc Ao 13mm (mod dil). Mod MR. Mod dil LA/RV/RA. PASP 102mmHg. Mod to large circumferential pericardial effusion w/o tamponade; b. 12/05/2017 Echo: EF 15-20%, mod AI, mild to mod MR, mod TR, PASP 22mmHg, mod pericardial effusion w/o tamponade.  . Hypertension   . Hypothyroidism   . NICM (nonischemic cardiomyopathy) ([place])    a. S/p prior AICD->extracted in 2015 2/2 MSSA bacteremia; b. s/p Subcutaneous ICD; c. 11/2017 Echo: EF <20%; c. 12/05/2017 Echo: EF 15-20%.  . Pericardial effusion    a. 11/2017 Echo: Mod to large circumferential pericardial effusion w/o effusive-constrictive changes or tamponade physiology; b.  12/05/2017 Echo: EF 15-20%, mod pericardial effusion w/o hemodynamic compromise.  [name] Kitchen Permanent Atrial Fibrillation/Flutter ([place])    a. CHA2DS2VASc = 4-->No OAC 2/2 dementia.  . Recurrent left pleural effusion    a. 11/2017 s/p thoracentesis.  Path neg.  . Vitamin D deficiency      Patient Active Problem List   Diagnosis Date Noted  . Hypotension 12/10/2017  . Atrial fibrillation ([place]) 12/10/2017  . Pleural effusion 12/10/2017  . CHF (congestive heart failure) ([place]) 11/15/2017     Past Surgical History:  Procedure Laterality Date  . INSERT / REPLACE / REMOVE PACEMAKER    . KNEE SURGERY Right      Prior to Admission medications   Medication Sig Start Date End Date Taking? Authorizing Provider  acetaminophen (TYLENOL) 325 MG tablet Take 975 mg by mouth every 8 (eight) hours as needed.    [provider]  allopurinol (ZYLOPRIM) 100 MG tablet Take 0.5 tablets (50 mg total) by mouth daily. 11/20/17   Fritzi Mandes, MD  amiodarone (PACERONE) 100 MG tablet Take 100 mg by mouth daily.    [provider]  aspirin EC 81 MG tablet Take 81 mg by mouth daily.    [provider]  bisacodyl (DULCOLAX) 10 MG suppository Place 10 mg rectally daily as needed for moderate constipation.    [provider]  carvedilol (COREG) 3.125 MG tablet Take 1 tablet (3.125 mg total) by mouth 2 (two) times daily with a meal. 11/20/17  Fritzi Mandes, MD  citalopram (CELEXA) 10 MG tablet Take 10 mg by mouth daily.    [provider]  dicloxacillin (DYNAPEN) 500 MG capsule Take 500 mg by mouth 3 (three) times daily.    [provider]  dorzolamide (TRUSOPT) 2 % ophthalmic solution Place 1 drop into both eyes 2 (two) times daily.    [provider]  ipratropium-albuterol (DUONEB) 0.5-2.5 (3) MG/3ML SOLN Take 3 mLs by nebulization every 6 (six) hours as needed.    [provider]  latanoprost (XALATAN) 0.005 % ophthalmic solution Place 1 drop into both  eyes at bedtime.    [provider]  levothyroxine (SYNTHROID, LEVOTHROID) 112 MCG tablet Take 112 mcg by mouth daily before breakfast.    [provider]  loratadine (CLARITIN) 10 MG tablet Take 10 mg by mouth daily.    [provider]  Melatonin 3 MG TABS Take 1 tablet by mouth at bedtime.    [provider]  metolazone (ZAROXOLYN) 2.5 MG tablet Take 1 tablet (2.5 mg total) by mouth as needed. For a weight gain of 3 pounds overnight or 5 pounds in one week 04/07/18 07/06/18  Rise Mu, PA-C  polyethylene glycol powder (GAVILAX) powder Take 17 g by mouth daily.    [provider]  potassium chloride SA (K-DUR,KLOR-CON) 20 MEQ tablet Take 20 mEq by mouth daily.    [provider]  sennosides-docusate sodium (SENOKOT-S) 8.6-50 MG tablet Take 1 tablet by mouth 2 (two) times daily.    [provider]  simethicone (MYLICON) 80 MG chewable tablet Chew 80 mg by mouth every 6 (six) hours as needed for flatulence.    [provider]  Skin Protectants, Misc. (EUCERIN) cream Apply 1 application topically 2 (two) times daily.    [provider]  torsemide (DEMADEX) 20 MG tablet Take 2 tablets (40 mg total) by mouth 2 (two) times daily. 11/20/17   Fritzi Mandes, MD     Allergies Patient has no known allergies.   Family History  Problem Relation Age of Onset  . Hypertension Father     Social History Social History   Tobacco Use  . Smoking status: Never Smoker  . Smokeless tobacco: Never Used  Substance Use Topics  . Alcohol use: Not Currently  . Drug use: Never    Review of Systems  Constitutional:   No fever or chills.  ENT:   No sore throat. No rhinorrhea. Cardiovascular:   No chest pain or syncope. Respiratory:   No dyspnea or cough. Gastrointestinal:   Negative for abdominal pain, vomiting and diarrhea.  Positive bloody stool as above Musculoskeletal:   Negative for focal pain or swelling All other  systems reviewed and are negative except as documented above in ROS and HPI.  ____________________________________________   PHYSICAL EXAM:  VITAL SIGNS: ED Triage Vitals  Enc Vitals Group     BP 04/25/18 1756 101/87     Pulse Rate 04/25/18 1756 72     Resp 04/25/18 1756 16     Temp 04/25/18 1756 (!) 97.4 F (36.3 C)     Temp Source 04/25/18 1756 Oral     SpO2 04/25/18 1755 98 %     Weight 04/25/18 1758 165 lb (74.8 kg)     Height 04/25/18 1758 6' (1.829 m)     Head Circumference --      Peak Flow --      Pain Score 04/25/18 1757 0     Pain Loc --  Pain Edu? --      Excl. in [place]? --     Vital signs reviewed, nursing assessments reviewed.   Constitutional:   Alert and oriented. Non-toxic appearance. Eyes:   Conjunctivae are pale. EOMI. PERRL. ENT      Head:   Normocephalic and atraumatic.      Nose:   No congestion/rhinnorhea.       Mouth/Throat:   Dry mucous membranes, no pharyngeal erythema. No peritonsillar mass.       Neck:   No meningismus. Full ROM. Hematological/Lymphatic/Immunilogical:   No cervical lymphadenopathy. Cardiovascular:   RRR. Symmetric bilateral radial and DP pulses.  No murmurs. Cap refill less than 2 seconds. Respiratory:   Normal respiratory effort without tachypnea/retractions. Breath sounds are clear and equal bilaterally. No wheezes/rales/rhonchi. Gastrointestinal:   Soft and nontender. Non distended. There is no CVA tenderness.  No rebound, rigidity, or guarding.  Rectal exam performed with nurse Iona Beard at bedside.  Gross blood without stool.  No hemorrhoids.  No masses.  Hemoccult positive Musculoskeletal:   Normal range of motion in all extremities. No joint effusions.  No lower extremity tenderness.  No edema. Neurologic:   Normal speech and language.  Motor grossly intact. No acute focal neurologic deficits are appreciated.  Skin:    Skin is warm, dry and intact. No rash noted.  No petechiae, purpura, or  bullae.  ____________________________________________    LABS (pertinent positives/negatives) (all labs ordered are listed, but only abnormal results are displayed) Labs Reviewed  CBC WITH DIFFERENTIAL/PLATELET - Abnormal; Notable for the following components:      Result Value   RBC 2.79 (*)    Hemoglobin 8.4 (*)    HCT 26.8 (*)    Eosinophils Absolute 0.6 (*)    All other components within normal limits  APTT - Abnormal; Notable for the following components:   aPTT 41 (*)    All other components within normal limits  COMPREHENSIVE METABOLIC PANEL - Abnormal; Notable for the following components:   BUN 78 (*)    Creatinine, Ser 2.02 (*)    Calcium 8.8 (*)    Albumin 2.7 (*)    GFR calc non Af Amer 29 (*)    GFR calc Af Amer 34 (*)    All other components within normal limits  PROTIME-INR  TYPE AND SCREEN   ____________________________________________   EKG  Interpreted by me Sinus rhythm rate of 69, left axis, normal intervals.  Poor R wave progression.  Normal ST segments and T waves.  ____________________________________________    RADIOLOGY  No results found.  ____________________________________________   PROCEDURES Procedures  ____________________________________________    CLINICAL IMPRESSION / ASSESSMENT AND PLAN / ED COURSE  Pertinent labs & imaging results that were available during my care of the patient were reviewed by me and considered in my medical decision making (see chart for details).    Patient presents with lower GI bleed.  Not on anticoagulants.  Vital signs are stable.  Check labs, type and screen.  On initial assessment, no signs of shock, no recurrent rectal bleeding.  Patient will need hospitalization and GI work-up, possibly blood transfusion if recurrent bloody output or change in hemodynamics.  Clinical Course as of Apr 25 1925  Sat Apr 25, 2018  1925 Labs show baseline CKD.  Acute anemia related to lower GI bleed.  No  evidence of coagulopathy.  Chronic azotemia related to CKD.  Vital signs remained stable.  We will plan to admit for further management.  No hypotension.  No tachycardia.  No evidence of shock   [PS]    Clinical Course User Index [PS] Carrie Mew, MD     ____________________________________________   FINAL CLINICAL IMPRESSION(S) / ED DIAGNOSES    Final diagnoses:  Acute lower GI bleeding  Acute blood loss anemia     ED Discharge Orders    None      Portions of this note were generated with dragon dictation software. Dictation errors may occur despite best attempts at proofreading.    Carrie Mew, MD 04/25/18 [phone]

## 2018-04-25 NOTE — ED Notes (Signed)
Attempted report: told no available clean rooms as of yet.

## 2018-04-25 NOTE — H&P (Signed)
[place] [facility] [place] NAME: Alan Marshall    MR#:  [id number]  DATE OF BIRTH:  [birth year]  DATE OF ADMISSION:  04/25/2018  PRIMARY CARE PHYSICIAN: [facility] [place]   REQUESTING/REFERRING PHYSICIAN: Dr. Carrie Mew  CHIEF COMPLAINT:   Chief Complaint  Patient presents with  . Rectal Bleeding    HISTORY OF PRESENT ILLNESS:  Alan Marshall  is a 82 y.o. male with a known history of dementia, nonischemic cardiomyopathy with EF of 20%, paroxysmal A. fib not on anticoagulation due to his underlying dementia and risk of falls, hypertension, anemia, CKD stage IV and moderate pericardial effusion presents from [facility] secondary to blood in his stools. Patient had outpatient labs done 3 weeks ago on 04/04/2018 when his hemoglobin was 10.8.  Patient unable to provide any history due to his underlying dementia.  But does know that he is in the hospital because of blood in his stools.  Unsure of when this was started.  He was noted to have blood on his diaper today and a repeat hemoglobin done today was noted to be at 8.3 and so he was sent in.  Patient denies any shortness of breath, dizziness or lightheadedness or chest pain.  No syncope or near syncopal episodes.  Denies any abdominal pain.  He is not on any anticoagulation for his A. fib but does take a baby aspirin every day.  Unsure if he had a colonoscopy done in the past. In the emergency room, his hemoglobin was 8.4.  There is no active bleeding but there was old blood on his diaper.  PAST MEDICAL HISTORY:   Past Medical History:  Diagnosis Date  . Anemia   . CHF (congestive heart failure) ([facility])   . CKD (chronic kidney disease), stage IV ([place])   . Depression   . GERD (gastroesophageal reflux disease)   . Glaucoma   . Gout   . HFrEF (heart failure with reduced ejection fraction) ([place])    a. 11/2017 Echo: EF <20, diff HK. mild to mod AI. Asc Ao 54mm (mod dil). Mod MR. Mod  dil LA/RV/RA. PASP 7mmHg. Mod to large circumferential pericardial effusion w/o tamponade; b. 12/05/2017 Echo: EF 15-20%, mod AI, mild to mod MR, mod TR, PASP 59mmHg, mod pericardial effusion w/o tamponade.  . Hypertension   . Hypothyroidism   . NICM (nonischemic cardiomyopathy) ([place])    a. S/p prior AICD->extracted in 2015 2/2 MSSA bacteremia; b. s/p Subcutaneous ICD; c. 11/2017 Echo: EF <20%; c. 12/05/2017 Echo: EF 15-20%.  . Pericardial effusion    a. 11/2017 Echo: Mod to large circumferential pericardial effusion w/o effusive-constrictive changes or tamponade physiology; b. 12/05/2017 Echo: EF 15-20%, mod pericardial effusion w/o hemodynamic compromise.  [name] Kitchen Permanent Atrial Fibrillation/Flutter ([place])    a. CHA2DS2VASc = 4-->No OAC 2/2 dementia.  . Recurrent left pleural effusion    a. 11/2017 s/p thoracentesis.  Path neg.  . Vitamin D deficiency     PAST SURGICAL HISTORY:   Past Surgical History:  Procedure Laterality Date  . INSERT / REPLACE / REMOVE PACEMAKER    . KNEE SURGERY Right     SOCIAL HISTORY:   Social History   Tobacco Use  . Smoking status: Never Smoker  . Smokeless tobacco: Never Used  Substance Use Topics  . Alcohol use: Not Currently    FAMILY HISTORY:   Family History  Problem Relation Age of Onset  . Hypertension Father  DRUG ALLERGIES:  No Known Allergies  REVIEW OF SYSTEMS:   Review of Systems  Constitutional: Positive for malaise/fatigue. Negative for chills, fever and weight loss.  HENT: Negative for ear discharge, ear pain, hearing loss and nosebleeds.   Eyes: Negative for blurred vision, double vision and photophobia.  Respiratory: Negative for cough, hemoptysis, shortness of breath and wheezing.   Cardiovascular: Positive for orthopnea. Negative for chest pain, palpitations and leg swelling.  Gastrointestinal: Positive for blood in stool. Negative for abdominal pain, constipation, diarrhea, heartburn, melena, nausea and vomiting.    Genitourinary: Negative for dysuria, frequency, hematuria and urgency.  Musculoskeletal: Negative for back pain, myalgias and neck pain.  Skin: Negative for rash.  Neurological: Negative for dizziness, tingling, tremors, sensory change, speech change, focal weakness and headaches.  Endo/Heme/Allergies: Does not bruise/bleed easily.  Psychiatric/Behavioral: Negative for depression.    MEDICATIONS AT HOME:   Prior to Admission medications   Medication Sig Start Date End Date Taking? Authorizing Provider  acetaminophen (TYLENOL) 325 MG tablet Take 975 mg by mouth every 8 (eight) hours as needed.    [provider]  allopurinol (ZYLOPRIM) 100 MG tablet Take 0.5 tablets (50 mg total) by mouth daily. 11/20/17   Fritzi Mandes, MD  amiodarone (PACERONE) 100 MG tablet Take 100 mg by mouth daily.    [provider]  aspirin EC 81 MG tablet Take 81 mg by mouth daily.    [provider]  bisacodyl (DULCOLAX) 10 MG suppository Place 10 mg rectally daily as needed for moderate constipation.    [provider]  carvedilol (COREG) 3.125 MG tablet Take 1 tablet (3.125 mg total) by mouth 2 (two) times daily with a meal. 11/20/17   Fritzi Mandes, MD  citalopram (CELEXA) 10 MG tablet Take 10 mg by mouth daily.    [provider]  dicloxacillin (DYNAPEN) 500 MG capsule Take 500 mg by mouth 3 (three) times daily.    [provider]  dorzolamide (TRUSOPT) 2 % ophthalmic solution Place 1 drop into both eyes 2 (two) times daily.    [provider]  ipratropium-albuterol (DUONEB) 0.5-2.5 (3) MG/3ML SOLN Take 3 mLs by nebulization every 6 (six) hours as needed.    [provider]  latanoprost (XALATAN) 0.005 % ophthalmic solution Place 1 drop into both eyes at bedtime.    [provider]  levothyroxine (SYNTHROID, LEVOTHROID) 112 MCG tablet Take 112 mcg by mouth daily before breakfast.    [provider]  loratadine (CLARITIN) 10 MG  tablet Take 10 mg by mouth daily.    [provider]  Melatonin 3 MG TABS Take 1 tablet by mouth at bedtime.    [provider]  metolazone (ZAROXOLYN) 2.5 MG tablet Take 1 tablet (2.5 mg total) by mouth as needed. For a weight gain of 3 pounds overnight or 5 pounds in one week 04/07/18 07/06/18  Rise Mu, PA-C  polyethylene glycol powder (GAVILAX) powder Take 17 g by mouth daily.    [provider]  potassium chloride SA (K-DUR,KLOR-CON) 20 MEQ tablet Take 20 mEq by mouth daily.    [provider]  sennosides-docusate sodium (SENOKOT-S) 8.6-50 MG tablet Take 1 tablet by mouth 2 (two) times daily.    [provider]  simethicone (MYLICON) 80 MG chewable tablet Chew 80 mg by mouth every 6 (six) hours as needed for flatulence.    [provider]  Skin Protectants, Misc. (EUCERIN) cream Apply 1 application topically 2 (two) times daily.  [provider]  torsemide (DEMADEX) 20 MG tablet Take 2 tablets (40 mg total) by mouth 2 (two) times daily. 11/20/17   Fritzi Mandes, MD      VITAL SIGNS:  Blood pressure 93/66, pulse 66, temperature (!) 97.4 F (36.3 C), temperature source Oral, resp. rate 15, height 6' (1.829 m), weight 74.8 kg, SpO2 97 %.  PHYSICAL EXAMINATION:   Physical Exam  GENERAL:  82 y.o.-year-old elderly patient lying in the bed with no acute distress.  EYES: Pupils equal, round, reactive to light and accommodation. No scleral icterus. Extraocular muscles intact.  HEENT: Head atraumatic, normocephalic. Oropharynx and nasopharynx clear.  NECK:  Supple, no jugular venous distention. No thyroid enlargement, no tenderness.  LUNGS: Normal breath sounds bilaterally, no wheezing, rales,rhonchi or crepitation. No use of accessory muscles of respiration. Decreased bibasilar breath sounds CARDIOVASCULAR: S1, S2 normal. No   rubs, or gallops.2/6 systolic murmur present  ABDOMEN: Soft, nontender, nondistended. Bowel sounds  present. No organomegaly or mass.  EXTREMITIES: No pedal edema, cyanosis, or clubbing.  NEUROLOGIC: Cranial nerves II through XII are intact. Muscle strength 5/5 in all extremities. Sensation intact. Gait not checked. Global weakness noted. PSYCHIATRIC: The patient is alert and oriented x 2-3.  SKIN: No obvious rash, lesion, or ulcer.   LABORATORY PANEL:   CBC Recent Labs  Lab 04/25/18 1812  WBC 6.2  HGB 8.4*  HCT 26.8*  PLT 189   ------------------------------------------------------------------------------------------------------------------  Chemistries  Recent Labs  Lab 04/25/18 1812  NA 139  K 5.0  CL 101  CO2 28  GLUCOSE 98  BUN 78*  CREATININE 2.02*  CALCIUM 8.8*  AST 16  ALT 8  ALKPHOS 79  BILITOT 0.5   ------------------------------------------------------------------------------------------------------------------  Cardiac Enzymes No results for input(s): TROPONINI in the last 168 hours. ------------------------------------------------------------------------------------------------------------------  RADIOLOGY:  No results found.  EKG:   Orders placed or performed in visit on 04/25/18  . EKG 12-Lead    IMPRESSION AND PLAN:   Alan Marshall  is a 82 y.o. male with a known history of dementia, nonischemic cardiomyopathy with EF of 20%, paroxysmal A. fib not on anticoagulation due to his underlying dementia and risk of falls, hypertension, anemia, CKD stage IV and moderate pericardial effusion presents from [facility] secondary to blood in his stools.  1.  GI bleed-likely lower GI given his bright red bleeding.   -Admit, hemoglobin check every 8 hours.  If active bleed noted-we will get a bleeding scan.  But hemoglobin is stable at this time. -GI has been consulted.  Will place clear liquid diet for now -Hold aspirin  2.  Acute on chronic anemia-secondary to GI bleed.  No indication for transfusion unless hemodynamically unstable or hemoglobin  less than 7. -Check hemoglobin every 8 hours -Type and crossmatch  3.  CKD stage IV-Baseline creatinine seems to be around 2.4.  Stable at this time.  Continue to monitor  4. Chronic systolic heart failure- last EF 20% -[facility].  Well compensated at this time. -Continue home dose of torsemide -Since potassium is at the upper limits, will hold the potassium supplements for now  5.  Paroxysmal A. fib-rate controlled, on amiodarone and carvedilol. -Hold aspirin due to GI bleed.  No other anticoagulation  6. DVT Prophylaxis- TEDs and SCDs  Wheelchair bound at the [facility] Social worker consult    All the records are reviewed and case discussed with ED provider. Management plans discussed with the patient, family and they are in  agreement.  CODE STATUS: Full Code  TOTAL TIME TAKING CARE OF THIS PATIENT: 51 minutes.    Gladstone Lighter M.D on 04/25/2018 at 8:44 PM  Between 7am to 6pm - Pager - [phone]  After 6pm go to [email or web address] - password [email or web address] [place]  Office  [phone]  [name]; [facility] [place]

## 2018-04-26 ENCOUNTER — Inpatient Hospital Stay: Payer: Medicare Other

## 2018-04-26 ENCOUNTER — Encounter: Payer: Self-pay | Admitting: Physician Assistant

## 2018-04-26 DIAGNOSIS — F0390 Unspecified dementia without behavioral disturbance: Secondary | ICD-10-CM

## 2018-04-26 DIAGNOSIS — I213 ST elevation (STEMI) myocardial infarction of unspecified site: Secondary | ICD-10-CM | POA: Insufficient documentation

## 2018-04-26 DIAGNOSIS — K922 Gastrointestinal hemorrhage, unspecified: Secondary | ICD-10-CM

## 2018-04-26 DIAGNOSIS — I313 Pericardial effusion (noninflammatory): Secondary | ICD-10-CM

## 2018-04-26 DIAGNOSIS — D62 Acute posthemorrhagic anemia: Secondary | ICD-10-CM

## 2018-04-26 DIAGNOSIS — D500 Iron deficiency anemia secondary to blood loss (chronic): Secondary | ICD-10-CM

## 2018-04-26 DIAGNOSIS — I420 Dilated cardiomyopathy: Secondary | ICD-10-CM

## 2018-04-26 DIAGNOSIS — C801 Malignant (primary) neoplasm, unspecified: Secondary | ICD-10-CM

## 2018-04-26 DIAGNOSIS — I4821 Permanent atrial fibrillation: Secondary | ICD-10-CM

## 2018-04-26 LAB — HEMOGLOBIN AND HEMATOCRIT, BLOOD
HCT: 26.3 % — ABNORMAL LOW (ref 39.0–52.0)
Hemoglobin: 8.3 g/dL — ABNORMAL LOW (ref 13.0–17.0)

## 2018-04-26 LAB — LIPID PANEL
CHOL/HDL RATIO: 3.9 ratio
CHOLESTEROL: 192 mg/dL (ref 0–200)
HDL: 49 mg/dL (ref >40)
LDL Cholesterol: 123 mg/dL — ABNORMAL HIGH (ref 0–99)
TRIGLYCERIDES: 98 mg/dL (ref <150)
VLDL: 20 mg/dL (ref 0–40)

## 2018-04-26 LAB — CBC
HCT: 25.9 % — ABNORMAL LOW (ref 39.0–52.0)
Hemoglobin: 8.1 g/dL — ABNORMAL LOW (ref 13.0–17.0)
MCH: 30 pg (ref 26.0–34.0)
MCHC: 31.3 g/dL (ref 30.0–36.0)
MCV: 95.9 fL (ref 80.0–100.0)
PLATELETS: 199 10*3/uL (ref 150–400)
RBC: 2.7 MIL/uL — ABNORMAL LOW (ref 4.22–5.81)
RDW: 13.4 % (ref 11.5–15.5)
WBC: 5.6 10*3/uL (ref 4.0–10.5)
nRBC: 0 % (ref 0.0–0.2)

## 2018-04-26 LAB — PREPARE RBC (CROSSMATCH)

## 2018-04-26 LAB — RETICULOCYTES
IMMATURE RETIC FRACT: 13.5 % (ref 2.3–15.9)
RBC.: 2.66 MIL/uL — ABNORMAL LOW (ref 4.22–5.81)
RETIC COUNT ABSOLUTE: 39.1 10*3/uL (ref 19.0–186.0)
RETIC CT PCT: 1.5 % (ref 0.4–3.1)

## 2018-04-26 LAB — IRON AND TIBC
Iron: 46 ug/dL (ref 45–182)
Saturation Ratios: 22 % (ref 17.9–39.5)
TIBC: 214 ug/dL — ABNORMAL LOW (ref 250–450)
UIBC: 168 ug/dL

## 2018-04-26 LAB — BASIC METABOLIC PANEL
Anion gap: 13 (ref 5–15)
BUN: 76 mg/dL — AB (ref 8–23)
CALCIUM: 8.7 mg/dL — AB (ref 8.9–10.3)
CO2: 23 mmol/L (ref 22–32)
CREATININE: 1.99 mg/dL — AB (ref 0.61–1.24)
Chloride: 103 mmol/L (ref 98–111)
GFR calc Af Amer: 35 mL/min — ABNORMAL LOW (ref >60)
GFR, EST NON AFRICAN AMERICAN: 30 mL/min — AB (ref >60)
GLUCOSE: 75 mg/dL (ref 70–99)
Potassium: 4.7 mmol/L (ref 3.5–5.1)
SODIUM: 139 mmol/L (ref 135–145)

## 2018-04-26 LAB — FOLATE: Folate: 9 ng/mL (ref >5.9)

## 2018-04-26 LAB — FERRITIN: FERRITIN: 169 ng/mL (ref 24–336)

## 2018-04-26 LAB — ABO/RH: ABO/RH(D): A POS

## 2018-04-26 MED ORDER — ONDANSETRON HCL 4 MG PO TABS: 4.0000 mg | ORAL_TABLET | Freq: Four times a day (QID) | ORAL | Status: DC | PRN

## 2018-04-26 MED ORDER — ONDANSETRON HCL 4 MG/2ML IJ SOLN: 4.0000 mg | Freq: Four times a day (QID) | INTRAMUSCULAR | Status: DC | PRN

## 2018-04-26 MED ORDER — POLYETHYLENE GLYCOL 3350 17 G PO PACK: 17.0000 g | PACK | Freq: Every day | ORAL | Status: DC | PRN

## 2018-04-26 MED ORDER — TECHNETIUM TC 99M-LABELED RED BLOOD CELLS IV KIT
20.0000 | PACK | Freq: Once | INTRAVENOUS | Status: AC | PRN
  Administered 2018-04-26: 22.81 via INTRAVENOUS

## 2018-04-26 MED ORDER — HYDROCODONE-ACETAMINOPHEN 5-325 MG PO TABS: 1.0000 | ORAL_TABLET | ORAL | Status: DC | PRN

## 2018-04-26 MED ORDER — PANTOPRAZOLE SODIUM 40 MG IV SOLR
40.0000 mg | Freq: Two times a day (BID) | INTRAVENOUS | Status: DC
  Administered 2018-04-26 – 2018-04-27 (×3): 40 mg via INTRAVENOUS
  Filled 2018-04-26 (×4): qty 40

## 2018-04-26 MED ORDER — ACETAMINOPHEN 325 MG PO TABS
650.0000 mg | ORAL_TABLET | Freq: Four times a day (QID) | ORAL | Status: DC | PRN
  Administered 2018-04-27: 650 mg via ORAL
  Filled 2018-04-26: qty 2

## 2018-04-26 MED ORDER — SODIUM CHLORIDE 0.9% IV SOLUTION
Freq: Once | INTRAVENOUS | Status: AC
  Administered 2018-04-26: 18:00:00 via INTRAVENOUS

## 2018-04-26 MED ORDER — ENOXAPARIN SODIUM 40 MG/0.4ML ~~LOC~~ SOLN
40.0000 mg | SUBCUTANEOUS | Status: DC
  Filled 2018-04-26: qty 0.4

## 2018-04-26 MED ORDER — ACETAMINOPHEN 650 MG RE SUPP: 650.0000 mg | Freq: Four times a day (QID) | RECTAL | Status: DC | PRN

## 2018-04-26 NOTE — Progress Notes (Signed)
Family Meeting Note  Advance Directive:yes  Today a meeting took place with the Patient.  The following clinical team members were present during this meeting:MD  The following were discussed:Patient's diagnosis:gib  , Patient's progosis: Unable to determine and Goals for treatment: Full Code  Additional follow-up to be provided: full code He says he has advanced directives created  Time spent during discussion:16 minutes  Alan Masso, MD

## 2018-04-26 NOTE — Consult Note (Addendum)
Cardiology Consultation:   [name]; [id number]; [birth year]   Admit date: 04/25/2018 Date of Consult: 04/26/2018  Primary Care Provider: [facility] [employer] Primary Cardiologist: Fletcher Anon   Patient Profile:   Alan Marshall is a 82 y.o. male with a hx of HFrEF secondary to NICM, permanent Afib/flutter not on long term full-dose anticoagulation secondary to dementia, moderate to large circumferential pericardial effusion, CKD stage IV, recurrent left-sided pleural effusion s/p thoracentesis in 11/2017 with negative pathology, diabetes, HTN, hypothyroidism, dementia, and is mostly bed/wheelchair bound who is being seen today for the evaluation of pre-procedure risk stratification at the request of Dr. Benjie Karvonen.  History of Present Illness:   Alan Marshall was previously followed by [facility] and the [facility]. Atone point he was status post ICD, though this has since been extracted in the setting of MSSA bacteremia in 2015 followed by subsequent sub-Q ICD that is followed at the [facility]. He was admitted to the hospital in 11/2017 with dyspnea and volume overload. Echo during that admission showed an EF of < 20% with moderate mitral regurgitation, mild to moderate aortic insufficiency, moderate to large circumferential pericardial effusion without tamponade, and a PASP of 60 mmHg. Inpatient diuresis was complicated by CKD, though at discharge he was noted to have some improvement in his volume status with a discharge weight of 196 pounds. He was seen in hospital follow up in late 11/2017 with a weight down to 187 pounds. Follow up echo at that time showed a stable, moderate pericardial effusion. Follow up CXR in the setting of poor air movement on exam showed left greater than right pleural effusions with obscured/opacified left lower lung. In that setting, he was referred to pulmonology with follow up CXR demonstrating persistent collapse/consolidation of the left lung. He underwent  thoracentesis in 7/19 with follow up CXR showing continued left much worse than right basilar airspace disease with pulmonology feeling there was high likelihood of fluid recurrence with recommendation to not drain again in the setting of the patient's end-stage CHF unless it was causing dyspnea. Pathology on the pleural fluid was negative. He was seen on 8/27 and was stable with 2 pillow orthopnea and the head of the bed up about 30 degrees. His weight at the nursing home ranged between 195-198 pounds. He was noted to be much more volume overloaded than his prior visit in 11/2017 with a weight on 8/27 of 198 pounds (up 12 pounds compared to 11/2017 visit). BP was soft at 90/60. Of note, the patient weighed 197 pounds at his CHF Clinic visit on 01/20/18 with no changes being made.  He was advised to take metolazone 2.5 mg every MWF and was continued on torsemide 40 mg bid. BMET checked on 02/03/18 showed a stable renal function and a BUN/SCr of 45/1.85 (prior SCr 2.3). He was continued on diuresis as above and KCl 40 mEq bid was added given a potassium of 3.4. Follow BMET was advised in 1 week, though not completed. He was most recently seen in the office on 10/29 and was doing well. His weight was down 21 pounds from his 01/2018 visit and he continued to take torsemide 40 mg bid along with metolazone 2.5 mg MWF. He had significant improvement in his lower extremity edema. His metolazone was held and he was continued on torsemide 40 mg bid. Labs checked that day showed a bump in his SCr to 2/42 with a potassium of 3.2. Echo on 04/23/18 to trend his pericardial  effusion showed an EF of 20-25%, diffuse HK, mild to moderate AI, a moderate sized circumferential pericardial effusion predominantly off the LV free wall measuring 4.48 cm with features not consistent with tamponade physiology. Follow up bmet on 04/23/18 showed improved SCr of 2.1 and K+ 4.1.   Patient was brought to the ED on 04/25/18 from [facility]  secondary to BRBPR. Prior HGB on 11/19/17 of 10.3 with a HGB upon arrival to the ED on 04/25/18 of 8.3 trending to 8.1. Renal function stable at 1.99 currently with a potassium of 4.7. Albumin 2.7. ASA has been held upon admission. Cardiology asked to risk stratify for GI evaluation.   He continues to do well from a cardiac perspective and denies and chest pain, SOB, orthopnea, lower extremity swelling, abdominal distension, PND, orthopnea, early satiety, presyncope or syncope. He is uncertain how long the BRBPR has been present, though does not think it has been more than a day or two. He is currently without complaints and has eaten breakfast this morning.   Past Medical History:  Diagnosis Date  . Anemia   . CHF (congestive heart failure) ([place])   . CKD (chronic kidney disease), stage IV ([place])   . Depression   . GERD (gastroesophageal reflux disease)   . Glaucoma   . Gout   . HFrEF (heart failure with reduced ejection fraction) ([place])    a. 11/2017 Echo: EF <20, diff HK. mild to mod AI. Asc Ao 19mm (mod dil). Mod MR. Mod dil LA/RV/RA. PASP 103mmHg. Mod to large circumferential pericardial effusion w/o tamponade; b. 12/05/2017 Echo: EF 15-20%, mod AI, mild to mod MR, mod TR, PASP 24mmHg, mod pericardial effusion w/o tamponade.  . Hypertension   . Hypothyroidism   . NICM (nonischemic cardiomyopathy) ([place])    a. S/p prior AICD->extracted in 2015 2/2 MSSA bacteremia; b. s/p Subcutaneous ICD; c. 11/2017 Echo: EF <20%; c. 12/05/2017 Echo: EF 15-20%.  . Pericardial effusion    a. 11/2017 Echo: Mod to large circumferential pericardial effusion w/o effusive-constrictive changes or tamponade physiology; b. 12/05/2017 Echo: EF 15-20%, mod pericardial effusion w/o hemodynamic compromise.  [name] Kitchen Permanent Atrial Fibrillation/Flutter ([place])    a. CHA2DS2VASc = 4-->No OAC 2/2 dementia.  . Recurrent left pleural effusion    a. 11/2017 s/p thoracentesis.  Path neg.  . Vitamin D deficiency     Past Surgical History:    Procedure Laterality Date  . INSERT / REPLACE / REMOVE PACEMAKER    . KNEE SURGERY Right      Home Meds: Prior to Admission medications   Medication Sig Start Date End Date Taking? Authorizing Provider  acetaminophen (TYLENOL) 325 MG tablet Take 975 mg by mouth every 8 (eight) hours as needed.    [provider]  allopurinol (ZYLOPRIM) 100 MG tablet Take 0.5 tablets (50 mg total) by mouth daily. 11/20/17   Fritzi Mandes, MD  amiodarone (PACERONE) 100 MG tablet Take 100 mg by mouth daily.    [provider]  aspirin EC 81 MG tablet Take 81 mg by mouth daily.    [provider]  bisacodyl (DULCOLAX) 10 MG suppository Place 10 mg rectally daily as needed for moderate constipation.    [provider]  carvedilol (COREG) 3.125 MG tablet Take 1 tablet (3.125 mg total) by mouth 2 (two) times daily with a meal. 11/20/17   Fritzi Mandes, MD  citalopram (CELEXA) 10 MG tablet Take 10 mg by mouth daily.    [provider]  dicloxacillin [facility])  500 MG capsule Take 500 mg by mouth 3 (three) times daily.    [provider]  dorzolamide (TRUSOPT) 2 % ophthalmic solution Place 1 drop into both eyes 2 (two) times daily.    [provider]  ipratropium-albuterol (DUONEB) 0.5-2.5 (3) MG/3ML SOLN Take 3 mLs by nebulization every 6 (six) hours as needed.    [provider]  latanoprost (XALATAN) 0.005 % ophthalmic solution Place 1 drop into both eyes at bedtime.    [provider]  levothyroxine (SYNTHROID, LEVOTHROID) 112 MCG tablet Take 112 mcg by mouth daily before breakfast.    [provider]  loratadine (CLARITIN) 10 MG tablet Take 10 mg by mouth daily.    [provider]  Melatonin 3 MG TABS Take 1 tablet by mouth at bedtime.    [provider]  metolazone (ZAROXOLYN) 2.5 MG tablet Take 1 tablet (2.5 mg total) by mouth as needed. For a weight gain of 3 pounds overnight or 5 pounds in one week 04/07/18  07/06/18  Rise Mu, PA-C  polyethylene glycol powder (GAVILAX) powder Take 17 g by mouth daily.    [provider]  potassium chloride SA (K-DUR,KLOR-CON) 20 MEQ tablet Take 20 mEq by mouth daily.    [provider]  sennosides-docusate sodium (SENOKOT-S) 8.6-50 MG tablet Take 1 tablet by mouth 2 (two) times daily.    [provider]  simethicone (MYLICON) 80 MG chewable tablet Chew 80 mg by mouth every 6 (six) hours as needed for flatulence.    [provider]  Skin Protectants, Misc. (EUCERIN) cream Apply 1 application topically 2 (two) times daily.    [provider]  torsemide (DEMADEX) 20 MG tablet Take 2 tablets (40 mg total) by mouth 2 (two) times daily. 11/20/17   Fritzi Mandes, MD    Inpatient Medications: Scheduled Meds: . allopurinol  50 mg Oral Daily  . amiodarone  100 mg Oral Daily  . carvedilol  3.125 mg Oral BID WC  . citalopram  10 mg Oral Daily  . dorzolamide  1 drop Both Eyes BID  . enoxaparin (LOVENOX) injection  40 mg Subcutaneous Q24H  . latanoprost  1 drop Both Eyes QHS  . levothyroxine  112 mcg Oral QAC breakfast  . loratadine  10 mg Oral Daily  . Melatonin  2.5 mg Oral QHS  . pantoprazole (PROTONIX) IV  40 mg Intravenous Q12H  . senna-docusate   Oral BID  . torsemide  40 mg Oral BID   Continuous Infusions: . sodium chloride 75 mL/hr at 04/26/18 0739   PRN Meds: acetaminophen **OR** acetaminophen, bisacodyl, HYDROcodone-acetaminophen, ipratropium-albuterol, ondansetron **OR** ondansetron (ZOFRAN) IV, polyethylene glycol, simethicone  Allergies:  No Known Allergies  Social History:   Social History   Socioeconomic History  . Marital status: Unknown    Spouse name: Not on file  . Number of children: Not on file  . Years of education: Not on file  . Highest education level: Not on file  Occupational History  . Not on file  Social Needs  . Financial resource strain: Not on file  . Food insecurity:    Worry:  Not on file    Inability: Not on file  . Transportation needs:    Medical: Not on file    Non-medical: Not on file  Tobacco Use  . Smoking status: Never Smoker  . Smokeless tobacco: Never Used  Substance and Sexual Activity  . Alcohol use: Not Currently  . Drug use: Never  .  Sexual activity: Not on file  Lifestyle  . Physical activity:    Days per week: Not on file    Minutes per session: Not on file  . Stress: Not on file  Relationships  . Social connections:    Talks on phone: Not on file    Gets together: Not on file    Attends religious service: Not on file    Active member of club or organization: Not on file    Attends meetings of clubs or organizations: Not on file    Relationship status: Not on file  . Intimate partner violence:    Fear of current or ex partner: Not on file    Emotionally abused: Not on file    Physically abused: Not on file    Forced sexual activity: Not on file  Other Topics Concern  . Not on file  Social History Narrative   Chronic resident at [facility]. Wheelchair bound at baseline     Family History:   Family History  Problem Relation Age of Onset  . Hypertension Father     ROS:  Review of Systems  Constitutional: Positive for malaise/fatigue. Negative for chills, diaphoresis, fever and weight loss.  HENT: Negative for congestion.   Eyes: Negative for discharge and redness.  Respiratory: Negative for cough, hemoptysis, sputum production, shortness of breath and wheezing.   Cardiovascular: Negative for chest pain, palpitations, orthopnea, claudication, leg swelling and PND.  Gastrointestinal: Positive for blood in stool. Negative for abdominal pain, constipation, diarrhea, heartburn, melena, nausea and vomiting.  Genitourinary: Negative for hematuria.  Musculoskeletal: Negative for falls and myalgias.  Skin: Positive for itching. Negative for rash.  Neurological: Negative for dizziness, tingling, tremors, sensory change, speech  change, focal weakness, loss of consciousness and weakness.  Endo/Heme/Allergies: Does not bruise/bleed easily.  Psychiatric/Behavioral: Negative for substance abuse. The patient is not nervous/anxious.   All other systems reviewed and are negative.     Physical Exam/Data:   Vitals:   04/25/18 2145 04/25/18 2200 04/25/18 2250 04/26/18 0540  BP: 96/71 100/66 106/79 130/67  Pulse:   72 67  Resp: 16 12 20 18   Temp:   (!) 97.4 F (36.3 C) (!) 97.4 F (36.3 C)  TempSrc:   Oral   SpO2:   100% 100%  Weight:      Height:        Intake/Output Summary (Last 24 hours) at 04/26/2018 0926 Last data filed at 04/26/2018 0739 Gross per 24 hour  Intake 1108.34 ml  Output 0 ml  Net 1108.34 ml   Filed Weights   04/25/18 1758  Weight: 74.8 kg   Body mass index is 22.38 kg/m.   Physical Exam: General: Frail appearing, in no acute distress. Head: Normocephalic, atraumatic, sclera non-icteric, no xanthomas, nares without discharge.  Neck: Negative for carotid bruits. JVD not elevated. Lungs: Diminished breath sounds along the left base. Breathing is unlabored. Heart: RRR with S1 S2. II/VI diastolic murmur along the sternal border, no rubs, or gallops appreciated. Abdomen: Soft, non-tender, non-distended with normoactive bowel sounds. No hepatomegaly. No rebound/guarding. No obvious abdominal masses. Msk:  Strength and tone appear normal for age. Extremities: No clubbing or cyanosis. No edema. Distal pedal pulses are 2+ and equal bilaterally. Neuro: Alert and oriented X 3. No facial asymmetry. No focal deficit. Moves all extremities spontaneously. Psych:  Responds to questions appropriately with a normal affect.   EKG:  The EKG was personally reviewed and demonstrates: NSR, 1st degree AV block, nonspecific  IVCD, nonspecific st/t changes  Telemetry:  Telemetry was personally reviewed and demonstrates: NSR with 1st degree AV block with rare PVCs  Weights: Filed Weights   04/25/18 1758    Weight: 74.8 kg    Relevant CV Studies: 2-D echo 11/16/2017: Study Conclusions  - Left ventricle: The cavity size was mildly dilated. Systolic   function was severely reduced. The estimated ejection fraction   was < 20%. Diffuse hypokinesis. Regional wall motion   abnormalities cannot be excluded. The study is not technically   sufficient to allow evaluation of LV diastolic function. - Aortic valve: There was mild to moderate regurgitation. - Aorta: Ascending aortic diameter: 45 mm (S). - Ascending aorta: The ascending aorta was moderately dilated. - Mitral valve: There was moderate regurgitation. - Left atrium: The atrium was moderately dilated. - Right ventricle: The cavity size was moderately dilated. Wall   thickness was normal. Systolic function was moderately reduced. - Right atrium: The atrium was moderately dilated. - Pulmonary arteries: Systolic pressure was moderate to severely   elevated. PA peak pressure: 60 mm Hg (S). - Pericardium, extracardiac: Moderate to large sized   circumferential pericardial effusion was present but no   effusive-constrictive changes were noted. Features were not   consistent with tamponade physiology.  Impressions:  - Rhythm is atrial flutter. __________  2-D echo 12/05/2017: Study Conclusions  - Left ventricle: The cavity size was normal. Systolic function was   severely reduced. The estimated ejection fraction was in the   range of 15% to 20%. Wall motion was normal; there were no   regional wall motion abnormalities. - Aortic valve: There was moderate regurgitation. - Mitral valve: There was mild to moderate regurgitation. - Tricuspid valve: There was moderate regurgitation. - Pulmonary arteries: Systolic pressure was mildly increased. PA   peak pressure: 42 mm Hg (S). - Pericardium, extracardiac: A moderate pericardial effusion was   identified. There was no evidence of hemodynamic compromise.  Impressions:  - no  significant change since recent echo. __________  2-D echo 04/23/2018: Study Conclusions  - Left ventricle: The cavity size was moderately dilated. Systolic   function was severely reduced. The estimated ejection fraction   was <20% Diffuse hypokinesis. Regional wall motion abnormalities   cannot be excluded. - Aortic valve: There was mild to moderate regurgitation. - Pericardium, extracardiac: A moderate sized circumferential   pericardial effusion was identified, predominantly off the LV   free wall, measuring 4.48 cm. Features were not consistent with   tamponade physiology.  Impressions:  - No significant change in pericardial effusion compared to 11/2017.  Laboratory Data:  Chemistry Recent Labs  Lab 04/25/18 1310 04/25/18 1812 04/26/18 0610  NA 141 139 139  K 4.9 5.0 4.7  CL 103 101 103  CO2 30 28 23   GLUCOSE 83 98 75  BUN 81* 78* 76*  CREATININE 2.12* 2.02* 1.99*  CALCIUM 8.8* 8.8* 8.7*  GFRNONAA 28* 29* 30*  GFRAA 32* 34* 35*  ANIONGAP 8 10 13     Recent Labs  Lab 04/25/18 1812  PROT 7.3  ALBUMIN 2.7*  AST 16  ALT 8  ALKPHOS 79  BILITOT 0.5   Hematology Recent Labs  Lab 04/25/18 1310 04/25/18 1812 04/25/18 2331 04/26/18 0610  WBC 6.5 6.2  --  5.6  RBC 2.77* 2.79*  --  2.70*  2.66*  HGB 8.3* 8.4* 8.4* 8.1*  HCT 26.5* 26.8*  --  25.9*  MCV 95.7 96.1  --  95.9  MCH 30.0 30.1  --  30.0  MCHC 31.3 31.3  --  31.3  RDW 13.4 13.4  --  13.4  PLT 200 189  --  199   Cardiac EnzymesNo results for input(s): TROPONINI in the last 168 hours. No results for input(s): TROPIPOC in the last 168 hours.  BNPNo results for input(s): BNP, PROBNP in the last 168 hours.  DDimer No results for input(s): DDIMER in the last 168 hours.  Radiology/Studies:  No results found.  Assessment and Plan:   1. Pre-procedure cardiac evaluation: -He is well compensated from a heart failure perspective and does not have any symptoms concerning for ischemia -He has known  NICM with an EF of 25% -He is moderate to high risk for any non-cardiac procedure per modified Lee criteria  -Given his acute blood loss anemia, dementia, and comorbid conditions, no further cardiac testing will reduce this risk -If GI procedures are needed to evaluate and treat/stablize his acute blood loss anemia they should not be delayed   2. GI bleed with acute blood loss anemia: -Maintain HGB > 8.5 -ASA held -GI evaluation pending  3. HFrEF secondary to NICM: -He does not appear grossly volume overloaded at this time and is well compensated from a cardiac perspective  -Recent echo as above, no indication to repeat at this time -Continue torsemide 40 mg bid and Coreg 3.125 mg bid -Not on an ACE inhibitor/ARB/spironolactone/Entresto in the setting of prior relative hypotension and underlying CKD -CHF education -Daily weights with strict I/O  4. Moderate to large pericardial effusion: -Stable and without evidence of tamponade physiology by recent echo on 04/23/2018 -Asymptomatic   5. Afib/flutter: -Maintaining sinus rhythm with 1st degree AV block -Not on [place] as an outpatient secondary to dementia and comorbid conditions  -Not a candidate for anticoagulation while inpatient given acute blood loss anemia with active GI bleed -Continue amiodarone and Coreg   6. Left pleural effusion: -Status post thoracentesis by pulmonology -Pulmonology has recommended no repeat intervention unless patient is symptomatic with dyspnea -Currently, asymptomatic  -Monitor   7. CKD stage IV: -Renal function stable   For questions or updates, please contact [place] Please consult [email or web address] for contact info under Cardiology/STEMI.   Signed, Christell Faith, PA-C [place] Pager: [phone] 04/26/2018, 9:26 AM

## 2018-04-26 NOTE — Clinical Social Work Note (Signed)
Clinical Social Work Assessment  Patient Details  Name: Alan Marshall MRN: [id number] Date of Birth: June 21, 1935  Date of referral:  04/26/18               Reason for consult:  Facility Placement                Permission sought to share information with:  Chartered certified accountant granted to share information::  Yes, Verbal Permission Granted  Name::        Agency::  [facility]  Relationship::     Contact Information:     Housing/Transportation Living arrangements for the past 2 months:  [place] of Information:  Patient, Scientist, water quality, Facility Patient Interpreter Needed:  None Criminal Activity/Legal Involvement Pertinent to Current Situation/Hospitalization:  No - Comment as needed Significant Relationships:  Adult Children, Warehouse manager Lives with:  Facility Resident Do you feel safe going back to the place where you live?  Yes Need for family participation in patient care:  No (Coment)  Care giving concerns:  Patient admitted from a facility   Social Worker assessment / plan:  The CSW met with the patient at bedside to discuss discharge planning. The patient was pleasant and alert, and he confirmed that he has lived at [facility] for the past 2.5 years. The patient also confirmed that he is attached to the [facility] for PCP and contract skilled nursing. The patient reported that he would like to return to [facility] as soon as he is stable.  The CSW attempted to contact the patient's daughter/HCPOA and left a HIPPA compliant voice mail. The patient will most likely discharge tomorrow via EMS. The CSW will follow up with [facility] during regular business hours to facilitate discharge planning and reattempt to contact the patient's daughter. CSW is following.  Employment status:  Retired Forensic scientist:  Information systems manager, [place] Benefit PT Recommendations:  Not assessed at this time Information / Referral  to community resources:     Patient/Family's Response to care:  The patient thanked the CSW.  Patient/Family's Understanding of and Emotional Response to Diagnosis, Current Treatment, and Prognosis:  The patient seems upbeat and shared that he feels well today.  Emotional Assessment Appearance:  Appears stated age Attitude/Demeanor/Rapport:  Gracious, Engaged Affect (typically observed):  Pleasant, Stable Orientation:  Oriented to Self, Oriented to Place, Oriented to  Time, Oriented to Situation Alcohol / Substance use:  Never Used Psych involvement (Current and /or in the community):  No (Comment)  Discharge Needs  Concerns to be addressed:  Care Coordination, Discharge Planning Concerns Readmission within the last 30 days:  No Current discharge risk:  Chronically ill, Physical Impairment Barriers to Discharge:  Continued Medical Work up   [employer], LCSW 04/26/2018, 2:24 PM

## 2018-04-26 NOTE — Progress Notes (Signed)
[place] [address] NAME: Alan Marshall    MR#:  [id number]  DATE OF BIRTH:  06/08/1936  SUBJECTIVE:  Patient presented from [facility] due to blood in the stool  REVIEW OF SYSTEMS:    Review of Systems  Constitutional: Negative for fever, chills weight loss HENT: Negative for ear pain, nosebleeds, congestion, facial swelling, rhinorrhea, neck pain, neck stiffness and ear discharge.   Respiratory: Negative for cough, shortness of breath, wheezing  Cardiovascular: Negative for chest pain, palpitations and leg swelling.  Gastrointestinal: Negative for heartburn, abdominal pain, vomiting, diarrhea or consitpation Genitourinary: Negative for dysuria, urgency, frequency, hematuria Musculoskeletal: Negative for back pain or joint pain Neurological: Negative for dizziness, seizures, syncope, focal weakness,  numbness and headaches.  Hematological: Does not bruise/bleed easily.  Psychiatric/Behavioral: Negative for hallucinations, dysphoric mood  + mild dementia  Tolerating Diet: yes      DRUG ALLERGIES:  No Known Allergies  VITALS:  Blood pressure 130/67, pulse 67, temperature (!) 97.4 F (36.3 C), resp. rate 18, height 6' (1.829 m), weight 74.8 kg, SpO2 100 %.  PHYSICAL EXAMINATION:  Constitutional: Appears well-developed and well-nourished. No distress. HENT: Normocephalic. [name] Kitchen Oropharynx is clear and moist.  Eyes: Conjunctivae and EOM are normal. PERRLA, no scleral icterus.  Neck: Normal ROM. Neck supple. No JVD. No tracheal deviation. CVS: RRR, S1/S2 +,  2/56murmurs, no gallops, no carotid bruit.  Pulmonary: Effort and breath sounds normal, no stridor, rhonchi, wheezes, rales.  Abdominal: Soft. BS +,  no distension, tenderness, rebound or guarding.  Musculoskeletal: Normal range of motion. No edema and no tenderness.  Neuro: Alert. CN 2-12 grossly intact. No focal deficits. Skin: Skin is warm and dry. No rash  noted. Psychiatric: Normal mood and affect.      LABORATORY PANEL:   CBC Recent Labs  Lab 04/26/18 0610  WBC 5.6  HGB 8.1*  HCT 25.9*  PLT 199   ------------------------------------------------------------------------------------------------------------------  Chemistries  Recent Labs  Lab 04/25/18 1812 04/26/18 0610  NA 139 139  K 5.0 4.7  CL 101 103  CO2 28 23  GLUCOSE 98 75  BUN 78* 76*  CREATININE 2.02* 1.99*  CALCIUM 8.8* 8.7*  AST 16  --   ALT 8  --   ALKPHOS 79  --   BILITOT 0.5  --    ------------------------------------------------------------------------------------------------------------------  Cardiac Enzymes No results for input(s): TROPONINI in the last 168 hours. ------------------------------------------------------------------------------------------------------------------  RADIOLOGY:  No results found.   ASSESSMENT AND PLAN:   82 year old male with history of nonischemic cardiomyopathy, permanent atrial fibrillation/flutter not on anticoagulation due to dementia, pericardial effusion and chronic kidney disease stage IV who presented from [facility] due to bloody stools.  1.  Rectal bleeding: Patient evaluated by GI this morning.  Given multiple comorbidities and poor functional status endoscopic evaluation is not recommended. GI bleeding scan ordered  2.  Acute on chronic blood loss anemia: Follow-up on final iron studies. Follow CBC Cardiology has recommended transfusion to maintain hemoglobin greater than 8.5 We will go ahead and transfuse 1 unit as hemoglobin is 8.1 this morning.  3.  Chronic kidney disease stage IV creatinine is at baseline  4.  Nonischemic cardiomyopathy: Continue torsemide and Coreg as recommended by cardiology.  5.  Known moderate to large pericardial effusion: This has been stable and there is no evidence of tamponade by recent echo on November 14.  6.  History of permanent atrial fibrillation/flutter:  Patient is now in sinus  rhythm with first-degree AV block Discontinue telemetry monitoring Continue amiodarone and Coreg Management plans discussed with the patient and he is in agreement.  CODE STATUS: Full  TOTAL TIME TAKING CARE OF THIS PATIENT: 30 minutes.     POSSIBLE D/C tomorrow, DEPENDING ON CLINICAL CONDITION.   Jessikah Dicker M.D on 04/26/2018 at 11:08 AM  Between 7am to 6pm - Pager - [phone] After 6pm go to [email or web address] - password [email or web address] [facility]  Office  [phone]  [name]; [place]  Note: This dictation was prepared with Dragon dictation along with smaller phrase technology. Any transcriptional errors that result from this process are unintentional.

## 2018-04-26 NOTE — Progress Notes (Signed)
PT Cancellation Note  Patient Details Name: Alan Marshall MRN: [id number] DOB: July 26, 1935   Cancelled Treatment:    Reason Eval/Treat Not Completed: Medical issues which prohibited therapy(Consult received and chart reviewed.  Patient currently scheduled for NM GI blood loss test and upcoming blood transfusion.  Will hold until testing and transfusion complete and re-attempt evaluation next date as needed.)  Of note, patient LTC resident at [facility]; planning to return at discharge.   Tangela Dolliver H. Owens Shark, PT, DPT, NCS 04/26/18, 2:51 PM [phone]

## 2018-04-26 NOTE — Progress Notes (Signed)
Bleeding scan negative Will watch for now Check daily CBC  Per cardiology, no indication for long term aspirin81mg . No clear benefit to use for AFib or NICM  Katheleen Stella,MD

## 2018-04-26 NOTE — NC FL2 (Signed)
[place] LEVEL OF CARE SCREENING TOOL     IDENTIFICATION  Patient Name: Alan Marshall Birthdate: [birth year] Sex: male Admission Date (Current Location): 04/25/2018  [place] and [place] Number:  Engineering geologist and Address:  [facility], [address], [place], [place] [address]      Provider Number: [id number]  Attending Physician Name and Address:  Bettey Costa, MD  Relative Name and Phone Number:  Jabe Jeanbaptiste (Daughter/HCPOA) [phone]    Current Level of Care: Hospital Recommended Level of Care: [place] Prior Approval Number:    Date Approved/Denied:   PASRR Number: [id number]  Discharge Plan: SNF    Current Diagnoses: Patient Active Problem List   Diagnosis Date Noted  . STEMI (ST elevation myocardial infarction) ([place]) 04/26/2018  . GI bleed 04/25/2018  . Hypotension 12/10/2017  . Atrial fibrillation ([place]) 12/10/2017  . Pleural effusion 12/10/2017  . CHF (congestive heart failure) ([place]) 11/15/2017    Orientation RESPIRATION BLADDER Height & Weight     Self, Time, Situation, Place  Normal Continent Weight: 165 lb (74.8 kg) Height:  6' (182.9 cm)  BEHAVIORAL SYMPTOMS/MOOD NEUROLOGICAL BOWEL NUTRITION STATUS      Continent Diet(Heart healthy)  AMBULATORY STATUS COMMUNICATION OF NEEDS Skin   Extensive Assist Verbally PU Stage and Appropriate Care                       Personal Care Assistance Level of Assistance  Bathing, Feeding, Dressing Bathing Assistance: Limited assistance Feeding assistance: Independent Dressing Assistance: Limited assistance     Functional Limitations Info  Sight, Hearing, Speech Sight Info: Adequate Hearing Info: Adequate Speech Info: Adequate    SPECIAL CARE FACTORS FREQUENCY                       Contractures Contractures Info: Not present    Additional Factors Info  Code Status, Allergies, Psychotropic Code Status Info: Full Allergies  Info: No Known Allergies Psychotropic Info: Celexa         Current Medications (04/26/2018):  This is the current hospital active medication list Current Facility-Administered Medications  Medication Dose Route Frequency Provider Last Rate Last Dose  . 0.9 %  sodium chloride infusion (Manually program via Guardrails IV Fluids)   Intravenous Once Bettey Costa, MD      . acetaminophen (TYLENOL) tablet 650 mg  650 mg Oral Q6H PRN Mody, Sital, MD       Or  . acetaminophen (TYLENOL) suppository 650 mg  650 mg Rectal Q6H PRN Mody, Sital, MD      . allopurinol (ZYLOPRIM) tablet 50 mg  50 mg Oral Daily Gladstone Lighter, MD   50 mg at 04/26/18 0803  . amiodarone (PACERONE) tablet 100 mg  100 mg Oral Daily Gladstone Lighter, MD   100 mg at 04/26/18 0804  . bisacodyl (DULCOLAX) suppository 10 mg  10 mg Rectal Daily PRN Gladstone Lighter, MD      . carvedilol (COREG) tablet 3.125 mg  3.125 mg Oral BID WC Gladstone Lighter, MD   3.125 mg at 04/26/18 0802  . citalopram (CELEXA) tablet 10 mg  10 mg Oral Daily Gladstone Lighter, MD   10 mg at 04/26/18 0803  . dorzolamide (TRUSOPT) 2 % ophthalmic solution 1 drop  1 drop Both Eyes BID Gladstone Lighter, MD   1 drop at 04/26/18 0805  . enoxaparin (LOVENOX) injection 40 mg  40 mg Subcutaneous Q24H Bettey Costa, MD      .  HYDROcodone-acetaminophen (NORCO/VICODIN) 5-325 MG per tablet 1-2 tablet  1-2 tablet Oral Q4H PRN Mody, Sital, MD      . ipratropium-albuterol (DUONEB) 0.5-2.5 (3) MG/3ML nebulizer solution 3 mL  3 mL Nebulization Q6H PRN Gladstone Lighter, MD      . latanoprost (XALATAN) 0.005 % ophthalmic solution 1 drop  1 drop Both Eyes QHS Gladstone Lighter, MD   1 drop at 04/26/18 0031  . levothyroxine (SYNTHROID, LEVOTHROID) tablet 112 mcg  112 mcg Oral QAC breakfast Gladstone Lighter, MD   112 mcg at 04/26/18 0521  . loratadine (CLARITIN) tablet 10 mg  10 mg Oral Daily Gladstone Lighter, MD   10 mg at 04/26/18 0804  . Melatonin TABS 2.5 mg  2.5  mg Oral QHS Gladstone Lighter, MD   2.5 mg at 04/26/18 0031  . ondansetron (ZOFRAN) tablet 4 mg  4 mg Oral Q6H PRN Bettey Costa, MD       Or  . ondansetron (ZOFRAN) injection 4 mg  4 mg Intravenous Q6H PRN Mody, Sital, MD      . pantoprazole (PROTONIX) injection 40 mg  40 mg Intravenous Q12H Mody, Sital, MD   40 mg at 04/26/18 1035  . polyethylene glycol (MIRALAX / GLYCOLAX) packet 17 g  17 g Oral Daily PRN Bettey Costa, MD      . senna-docusate (Senokot-S) tablet   Oral BID Gladstone Lighter, MD   1 tablet at 04/26/18 0030  . simethicone (MYLICON) chewable tablet 80 mg  80 mg Oral Q6H PRN Gladstone Lighter, MD      . technetium labeled red blood cells (ULTRATAG) injection kit 20 millicurie  20 millicurie Intravenous Once PRN [place], David A, MD      . torsemide [facility]) tablet 40 mg  40 mg Oral BID Gladstone Lighter, MD   40 mg at 04/26/18 0802     Discharge Medications: Please see discharge summary for a list of discharge medications.  Relevant Imaging Results:  Relevant Lab Results:   Additional Information SS# [id number]  Zettie Pho, LCSW

## 2018-04-26 NOTE — Progress Notes (Signed)
Per MD, okay to start blood transfusion after bleeding scan.

## 2018-04-26 NOTE — Consult Note (Addendum)
Alan Darby, MD [address]  [place]  [place], [place] [address]  Main: [phone]  Fax: [phone] Pager: [phone]   Consultation  Referring Provider:     No ref. provider found Primary Care Physician:  [facility] [place] Primary Gastroenterologist: None         Reason for Consultation:     Blood in stool, anemia  Date of Admission:  04/25/2018 Date of Consultation:  04/26/2018         HPI:   Alan Marshall is a 82 y.o. male a known history of dementia, nonischemic cardiomyopathy with EF of 20%, paroxysmal A. fib not on anticoagulation due to his underlying dementia and risk of falls, hypertension, anemia, CKD stage IV and moderate pericardial effusion presents from [facility] secondary to blood in his stools. Patient had outpatient labs done 3 weeks ago on 04/04/2018 when his hemoglobin was 10.8.  Patient unable to provide any history due to his underlying dementia.  But does know that he is in the hospital because of blood in his stools.  Unsure of when this was started.  He was noted to have blood on his diaper today and a repeat hemoglobin done today was noted to be at 8.3 and so he was sent in.  Patient denies any shortness of breath, dizziness or lightheadedness or chest pain.  No syncope or near syncopal episodes.  Denies any abdominal pain.  He is not on any anticoagulation for his A. fib but does take a baby aspirin every day.  Unsure if he had a colonoscopy done in the past. In the emergency room, his hemoglobin was 8.4.  There is no active bleeding but there was old blood on his diaper. GI consulted for further evaluation Patient is comfortable and eating breakfast during my encounter with him.  He said he ate beets which caused his stools to be red Patient tells me that his children live in [place], talked to me about random topics about his sister-in-law working and Arts development officer, about his children, and about his heart condition  and something about liver surgery although not documented NSAIDs: none  Antiplts/Anticoagulants/Anti thrombotics: ASA 81 for A Fib   GI Procedures: unable to find any Patient said he had colonoscopy in the past  Past Medical History:  Diagnosis Date  . Anemia   . CKD (chronic kidney disease), stage IV ([place])   . Depression   . GERD (gastroesophageal reflux disease)   . Glaucoma   . Gout   . HFrEF (heart failure with reduced ejection fraction) ([place])    a. 11/2017 Echo: EF <20, diff HK. mild to mod AI. Asc Ao 43m (mod dil). Mod MR. Mod dil LA/RV/RA. PASP 650mg. Mod to large circumferential pericardial effusion w/o tamponade; b. 12/05/2017 Echo: EF 15-20%, mod AI, mild to mod MR, mod TR, PASP 4249m, mod pericardial effusion w/o tamponade.  . Hypertension   . Hypothyroidism   . NICM (non[place]mic cardiomyopathy) ([place]  a. S/p prior AICD->extracted in 2015 2/2 MSSA bacteremia; b. s/p Subcutaneous ICD; c. 11/2017 Echo: EF <20%; c. 12/05/2017 Echo: EF 15-20%.  . Pericardial effusion    a. 11/2017 Echo: Mod to large circumferential pericardial effusion w/o effusive-constrictive changes or tamponade physiology; b. 12/05/2017 Echo: EF 15-20%, mod pericardial effusion w/o hemodynamic compromise.  . PMarland Kitchenrmanent Atrial Fibrillation/Flutter ([facility]  a. CHA2DS2VASc = 4-->No OAC 2/2 dementia.  . Recurrent left pleural effusion    a. 11/2017  s/p thoracentesis.  Path neg.  . Vitamin D deficiency     Past Surgical History:  Procedure Laterality Date  . INSERT / REPLACE / REMOVE PACEMAKER    . KNEE SURGERY Right     Prior to Admission medications   Medication Sig Start Date End Date Taking? Authorizing Provider  acetaminophen (TYLENOL) 325 MG tablet Take 975 mg by mouth every 8 (eight) hours as needed.    [provider]  allopurinol (ZYLOPRIM) 100 MG tablet Take 0.5 tablets (50 mg total) by mouth daily. 11/20/17   Fritzi Mandes, MD  amiodarone (PACERONE) 100 MG tablet Take 100 mg by mouth  daily.    [provider]  aspirin EC 81 MG tablet Take 81 mg by mouth daily.    [provider]  bisacodyl (DULCOLAX) 10 MG suppository Place 10 mg rectally daily as needed for moderate constipation.    [provider]  carvedilol (COREG) 3.125 MG tablet Take 1 tablet (3.125 mg total) by mouth 2 (two) times daily with a meal. 11/20/17   Fritzi Mandes, MD  citalopram (CELEXA) 10 MG tablet Take 10 mg by mouth daily.    [provider]  dicloxacillin (DYNAPEN) 500 MG capsule Take 500 mg by mouth 3 (three) times daily.    [provider]  dorzolamide (TRUSOPT) 2 % ophthalmic solution Place 1 drop into both eyes 2 (two) times daily.    [provider]  ipratropium-albuterol (DUONEB) 0.5-2.5 (3) MG/3ML SOLN Take 3 mLs by nebulization every 6 (six) hours as needed.    [provider]  latanoprost (XALATAN) 0.005 % ophthalmic solution Place 1 drop into both eyes at bedtime.    [provider]  levothyroxine (SYNTHROID, LEVOTHROID) 112 MCG tablet Take 112 mcg by mouth daily before breakfast.    [provider]  loratadine (CLARITIN) 10 MG tablet Take 10 mg by mouth daily.    [provider]  Melatonin 3 MG TABS Take 1 tablet by mouth at bedtime.    [provider]  metolazone (ZAROXOLYN) 2.5 MG tablet Take 1 tablet (2.5 mg total) by mouth as needed. For a weight gain of 3 pounds overnight or 5 pounds in one week 04/07/18 07/06/18  Rise Mu, PA-C  polyethylene glycol powder (GAVILAX) powder Take 17 g by mouth daily.    [provider]  potassium chloride SA (K-DUR,KLOR-CON) 20 MEQ tablet Take 20 mEq by mouth daily.    [provider]  sennosides-docusate sodium (SENOKOT-S) 8.6-50 MG tablet Take 1 tablet by mouth 2 (two) times daily.    [provider]  simethicone (MYLICON) 80 MG chewable tablet Chew 80 mg by mouth every 6 (six) hours as needed for flatulence.    [provider]  Skin Protectants, Misc. (EUCERIN) cream Apply 1 application topically 2 (two) times daily.    [provider]  torsemide (DEMADEX) 20 MG tablet Take 2 tablets (40 mg total) by mouth 2 (two) times daily. 11/20/17   Fritzi Mandes, MD    Current Facility-Administered Medications:  .  acetaminophen (TYLENOL) tablet 650 mg, 650 mg, Oral, Q6H PRN **OR** acetaminophen (TYLENOL) suppository 650 mg, 650 mg, Rectal, Q6H PRN, Mody, Sital, MD .  allopurinol (ZYLOPRIM) tablet 50 mg, 50 mg, Oral, Daily, Gladstone Lighter, MD, 50 mg at 04/26/18 0803 .  amiodarone (PACERONE) tablet 100 mg, 100 mg, Oral, Daily, Gladstone Lighter, MD, 100 mg at 04/26/18 0804 .  bisacodyl (DULCOLAX) suppository 10 mg, 10 mg, Rectal, Daily PRN,  Gladstone Lighter, MD .  carvedilol (COREG) tablet 3.125 mg, 3.125 mg, Oral, BID WC, Gladstone Lighter, MD, 3.125 mg at 04/26/18 0802 .  citalopram (CELEXA) tablet 10 mg, 10 mg, Oral, Daily, Gladstone Lighter, MD, 10 mg at 04/26/18 0803 .  dorzolamide (TRUSOPT) 2 % ophthalmic solution 1 drop, 1 drop, Both Eyes, BID, Gladstone Lighter, MD, 1 drop at 04/26/18 0805 .  enoxaparin (LOVENOX) injection 40 mg, 40 mg, Subcutaneous, Q24H, Mody, Sital, MD .  HYDROcodone-acetaminophen (NORCO/VICODIN) 5-325 MG per tablet 1-2 tablet, 1-2 tablet, Oral, Q4H PRN, Mody, Sital, MD .  ipratropium-albuterol (DUONEB) 0.5-2.5 (3) MG/3ML nebulizer solution 3 mL, 3 mL, Nebulization, Q6H PRN, Tressia Miners, Radhika, MD .  latanoprost (XALATAN) 0.005 % ophthalmic solution 1 drop, 1 drop, Both Eyes, QHS, Kalisetti, Radhika, MD, 1 drop at 04/26/18 0031 .  levothyroxine (SYNTHROID, LEVOTHROID) tablet 112 mcg, 112 mcg, Oral, QAC breakfast, Gladstone Lighter, MD, 112 mcg at 04/26/18 0521 .  loratadine (CLARITIN) tablet 10 mg, 10 mg, Oral, Daily, Gladstone Lighter, MD, 10 mg at 04/26/18 0804 .  Melatonin TABS 2.5 mg, 2.5 mg, Oral, QHS, Kalisetti, Radhika, MD, 2.5 mg at 04/26/18 0031 .  ondansetron (ZOFRAN)  tablet 4 mg, 4 mg, Oral, Q6H PRN **OR** ondansetron (ZOFRAN) injection 4 mg, 4 mg, Intravenous, Q6H PRN, Mody, Sital, MD .  pantoprazole (PROTONIX) injection 40 mg, 40 mg, Intravenous, Q12H, Mody, Sital, MD, 40 mg at 04/26/18 1035 .  polyethylene glycol (MIRALAX / GLYCOLAX) packet 17 g, 17 g, Oral, Daily PRN, Bettey Costa, MD .  senna-docusate (Senokot-S) tablet, , Oral, BID, Gladstone Lighter, MD, 1 tablet at 04/26/18 0030 .  simethicone (MYLICON) chewable tablet 80 mg, 80 mg, Oral, Q6H PRN, Gladstone Lighter, MD .  torsemide (DEMADEX) tablet 40 mg, 40 mg, Oral, BID, Gladstone Lighter, MD, 40 mg at 04/26/18 0802  Family History  Problem Relation Age of Onset  . Hypertension Father      Social History   Tobacco Use  . Smoking status: Never Smoker  . Smokeless tobacco: Never Used  Substance Use Topics  . Alcohol use: Not Currently  . Drug use: Never    Allergies as of 04/25/2018  . (No Known Allergies)    Review of Systems:    All systems reviewed and negative except where noted in HPI.   Physical Exam:  Vital signs in last 24 hours: Temp:  [97.4 F (36.3 C)] 97.4 F (36.3 C) (11/17 0540) Pulse Rate:  [64-72] 67 (11/17 0540) Resp:  [12-23] 18 (11/17 0540) BP: (91-130)/(59-87) 130/67 (11/17 0540) SpO2:  [94 %-100 %] 100 % (11/17 0540) Weight:  [74.8 kg] 74.8 kg (11/16 1758) Last BM Date: 04/25/18 General:   Pleasant, cooperative in NAD Head:  Normocephalic and atraumatic. Eyes:   No icterus.   Conjunctiva pink. PERRLA. Ears:  Normal auditory acuity. Neck:  Supple; no masses or thyroidomegaly Lungs: Respirations even and unlabored. Lungs clear to auscultation bilaterally.   No wheezes, crackles, or rhonchi.  Heart:  Regular rate and rhythm;  Without murmur, clicks, rubs or gallops Abdomen:  Soft, nondistended, nontender. Normal bowel sounds. No appreciable masses or hepatomegaly.  No rebound or guarding.  Rectal: Soft stools mixed with bright red blood per rectum on  rectal exam Msk:  Symmetrical without gross deformities.   Extremities: Dressing in his right lower leg, left leg with edema, cyanosis or clubbing. Neurologic:  Alert and oriented x2 Skin:  Intact without significant lesions or rashes, anasarca, significant scrotal edema. Psych:  Alert and cooperative. Normal  affect.  LAB RESULTS: CBC Latest Ref Rng & Units 04/26/2018 04/25/2018 04/25/2018  WBC 4.0 - 10.5 K/uL 5.6 - 6.2  Hemoglobin 13.0 - 17.0 g/dL 8.1(L) 8.4(L) 8.4(L)  Hematocrit 39.0 - 52.0 % 25.9(L) - 26.8(L)  Platelets 150 - 400 K/uL 199 - 189    BMET BMP Latest Ref Rng & Units 04/26/2018 04/25/2018 04/25/2018  Glucose 70 - 99 mg/dL 75 98 83  BUN 8 - 23 mg/dL 76(H) 78(H) 81(H)  Creatinine 0.61 - 1.24 mg/dL 1.99(H) 2.02(H) 2.12(H)  BUN/Creat Ratio 10 - 24 - - -  Sodium 135 - 145 mmol/L 139 139 141  Potassium 3.5 - 5.1 mmol/L 4.7 5.0 4.9  Chloride 98 - 111 mmol/L 103 101 103  CO2 22 - 32 mmol/L _0 Calcium 8.9 - 10.3 mg/dL 8.7(L) 8.8(L) 8.8(L)    LFT Hepatic Function Latest Ref Rng & Units 04/25/2018 11/17/2017 11/15/2017  Total Protein 6.5 - 8.1 g/dL 7.3 - 7.6  Albumin 3.5 - 5.0 g/dL 2.7(L) 3.0(L) 3.3(L)  AST 15 - 41 U/L 16 - 26  ALT 0 - 44 U/L 8 - 10(L)  Alk Phosphatase 38 - 126 U/L 79 - 78  Total Bilirubin 0.3 - 1.2 mg/dL 0.5 - 1.2     STUDIES: No results found.    Impression / Plan:   Alan Marshall is a 82 y.o. African-American male with history of severe nonischemic cardiomyopathy, EF of 20%, A. fib, not on full dose anticoagulation secondary to dementia, pericardial effusion, stage IV CKD, diabetes, hypertension, hypothyroidism, dementia and is mostly wheelchair-bound admitted with rectal bleeding and worsening anemia.  Rectal exam confirmed soft stool mixed with bright red blood.  Differentials include bleeding from stercoral ulcer or hemorrhoids or diverticular bleed or malignancy or ischemic colitis  Given his multiple comorbidities and poor functional  status, putting him through endoscopic evaluation would not benefit the patient.  Even if there is an occult malignancy contributing to anemia, further investigations would not benefit him given his multiple comorbidities, he probably would not be a candidate for treatment of malignancy if he has any Discussed with his daughter, Alan Marshall who is his healthcare power of attorney over phone at the number listed in the chart in detail regarding my concerns to perform endoscopic evaluation.  He may have a slow GI bleed, hemodynamically stable for now.  She wishes not to put him through any intervention that involves anesthesia at this time.  I will order nuclear medicine bleeding scan  Treat anemia conservatively at this time.  He does not have iron deficiency.  B12 and folate levels are pending Consider palliative care consult  Thank you for involving me in the care of this patient.   Dr. Vicente Males to cover from tomorrow    LOS: 1 day   Sherri Sear, MD  04/26/2018, 10:35 AM   Note: This dictation was prepared with Dragon dictation along with smaller phrase technology. Any transcriptional errors that result from this process are unintentional.

## 2018-04-27 LAB — BASIC METABOLIC PANEL
Anion gap: 11 (ref 5–15)
BUN: 77 mg/dL — ABNORMAL HIGH (ref 8–23)
CALCIUM: 8.6 mg/dL — AB (ref 8.9–10.3)
CO2: 25 mmol/L (ref 22–32)
Chloride: 103 mmol/L (ref 98–111)
Creatinine, Ser: 2.08 mg/dL — ABNORMAL HIGH (ref 0.61–1.24)
GFR calc Af Amer: 33 mL/min — ABNORMAL LOW (ref >60)
GFR, EST NON AFRICAN AMERICAN: 28 mL/min — AB (ref >60)
GLUCOSE: 130 mg/dL — AB (ref 70–99)
Potassium: 4.5 mmol/L (ref 3.5–5.1)
Sodium: 139 mmol/L (ref 135–145)

## 2018-04-27 LAB — CBC
HCT: 28.9 % — ABNORMAL LOW (ref 39.0–52.0)
HEMOGLOBIN: 9 g/dL — AB (ref 13.0–17.0)
MCH: 30 pg (ref 26.0–34.0)
MCHC: 31.1 g/dL (ref 30.0–36.0)
MCV: 96.3 fL (ref 80.0–100.0)
Platelets: 195 10*3/uL (ref 150–400)
RBC: 3 MIL/uL — ABNORMAL LOW (ref 4.22–5.81)
RDW: 14 % (ref 11.5–15.5)
WBC: 6 10*3/uL (ref 4.0–10.5)
nRBC: 0 % (ref 0.0–0.2)

## 2018-04-27 LAB — BPAM RBC
BLOOD PRODUCT EXPIRATION DATE: 201912072359
ISSUE DATE / TIME: 201911171753
UNIT TYPE AND RH: 6200

## 2018-04-27 LAB — TYPE AND SCREEN
ABO/RH(D): A POS
Antibody Screen: NEGATIVE
Unit division: 0

## 2018-04-27 LAB — HEMOGLOBIN AND HEMATOCRIT, BLOOD
HEMATOCRIT: 28.1 % — AB (ref 39.0–52.0)
HEMOGLOBIN: 9.1 g/dL — AB (ref 13.0–17.0)

## 2018-04-27 LAB — VITAMIN B12: Vitamin B-12: 380 pg/mL (ref 180–914)

## 2018-04-27 NOTE — Discharge Summary (Signed)
[facility] NAME: Alan Marshall    MR#:  [id number]  DATE OF BIRTH:  04-17-1936  DATE OF ADMISSION:  04/25/2018 ADMITTING PHYSICIAN: Gladstone Lighter, MD  DATE OF DISCHARGE: 04/27/2018 PRIMARY CARE PHYSICIAN: [name]    ADMISSION DIAGNOSIS:  Acute blood loss anemia [D62] Acute lower GI bleeding [K92.2]  DISCHARGE DIAGNOSIS:  Active Problems:  rectal bleeding   SECONDARY DIAGNOSIS:   Past Medical History:  Diagnosis Date  . Anemia   . CKD (chronic kidney disease), stage IV ([place])   . Depression   . GERD (gastroesophageal reflux disease)   . Glaucoma   . Gout   . HFrEF (heart failure with reduced ejection fraction) ([place])    a. 11/2017 Echo: EF <20, diff HK. mild to mod AI. Asc Ao 49mm (mod dil). Mod MR. Mod dil LA/RV/RA. PASP 73mmHg. Mod to large circumferential pericardial effusion w/o tamponade; b. 12/05/2017 Echo: EF 15-20%, mod AI, mild to mod MR, mod TR, PASP 46mmHg, mod pericardial effusion w/o tamponade.  . Hypertension   . Hypothyroidism   . NICM (nonischemic cardiomyopathy) ([place])    a. S/p prior AICD->extracted in 2015 2/2 MSSA bacteremia; b. s/p Subcutaneous ICD; c. 11/2017 Echo: EF <20%; c. 12/05/2017 Echo: EF 15-20%.  . Pericardial effusion    a. 11/2017 Echo: Mod to large circumferential pericardial effusion w/o effusive-constrictive changes or tamponade physiology; b. 12/05/2017 Echo: EF 15-20%, mod pericardial effusion w/o hemodynamic compromise.  [name] Kitchen Permanent Atrial Fibrillation/Flutter ([place])    a. CHA2DS2VASc = 4-->No OAC 2/2 dementia.  . Recurrent left pleural effusion    a. 11/2017 s/p thoracentesis.  Path neg.  . Vitamin D deficiency     HOSPITAL COURSE:  82 year old male with history of nonischemic cardiomyopathy, permanent atrial fibrillation/flutter not on anticoagulation due to dementia, pericardial effusion and chronic kidney disease stage IV who presented from [facility] due to  bloody stools.  1.  Rectal bleeding: Patient was evaluated by GI while in the hospital.  Patient underwent GI bleeding scan which was negative.  There were no recommendations for further evaluation as hemoglobin remained stable and he had no rectal bleeding while in the hospital. He has multiple comorbidities and poor functional status pending and through endoscopic evaluation would not benefit the patient as per GI consult.   2.  Acute on chronic blood loss anemia: Cardiology recommended to keep hemoglobin greater than 8.5.  He was transfused 1 unit PRBCs.  His hemoglobin has remained stable and is at 9 at the time of discharge.  3.  Chronic kidney disease stage IV creatinine is at baseline  4.  Nonischemic cardiomyopathy: Continue torsemide and Coreg as recommended by cardiology.  5.  Known moderate to large pericardial effusion: This has been stable and there is no evidence of tamponade by recent echo on November 14.  6.  History of permanent atrial fibrillation/flutter: Patient is now in sinus rhythm with first-degree AV block As per cardiology there is no recommendation to continue aspirin so this will be discontinued. Continue amiodarone and Coreg   DISCHARGE CONDITIONS AND DIET:   Stable for discharge on heart healthy diet  CONSULTS OBTAINED:  Treatment Team:  Lin Landsman, MD Minna Merritts, MD  DRUG ALLERGIES:  No Known Allergies  DISCHARGE MEDICATIONS:   Allergies as of 04/27/2018   No Known Allergies     Medication List    STOP taking these medications   aspirin EC 81 MG  tablet   dicloxacillin 500 MG capsule Commonly known as:  DYNAPEN     TAKE these medications   acetaminophen 325 MG tablet Commonly known as:  TYLENOL Take 975 mg by mouth every 8 (eight) hours as needed.   allopurinol 100 MG tablet Commonly known as:  ZYLOPRIM Take 0.5 tablets (50 mg total) by mouth daily.   amiodarone 100 MG tablet Commonly known as:  PACERONE Take  100 mg by mouth daily.   bisacodyl 10 MG suppository Commonly known as:  DULCOLAX Place 10 mg rectally daily as needed for moderate constipation.   carvedilol 3.125 MG tablet Commonly known as:  COREG Take 1 tablet (3.125 mg total) by mouth 2 (two) times daily with a meal.   citalopram 10 MG tablet Commonly known as:  CELEXA Take 10 mg by mouth daily.   dorzolamide 2 % ophthalmic solution Commonly known as:  TRUSOPT Place 1 drop into both eyes 2 (two) times daily.   eucerin cream Apply 1 application topically 2 (two) times daily.   GAVILAX powder Generic drug:  polyethylene glycol powder Take 17 g by mouth daily.   ipratropium-albuterol 0.5-2.5 (3) MG/3ML Soln Commonly known as:  DUONEB Take 3 mLs by nebulization every 6 (six) hours as needed.   latanoprost 0.005 % ophthalmic solution Commonly known as:  XALATAN Place 1 drop into both eyes at bedtime.   levothyroxine 112 MCG tablet Commonly known as:  SYNTHROID, LEVOTHROID Take 112 mcg by mouth daily before breakfast.   loratadine 10 MG tablet Commonly known as:  CLARITIN Take 10 mg by mouth daily.   Melatonin 3 MG Tabs Take 1 tablet by mouth at bedtime.   metolazone 2.5 MG tablet Commonly known as:  ZAROXOLYN Take 1 tablet (2.5 mg total) by mouth as needed. For a weight gain of 3 pounds overnight or 5 pounds in one week   potassium chloride SA 20 MEQ tablet Commonly known as:  K-DUR,KLOR-CON Take 20 mEq by mouth daily.   sennosides-docusate sodium 8.6-50 MG tablet Commonly known as:  SENOKOT-S Take 1 tablet by mouth 2 (two) times daily.   simethicone 80 MG chewable tablet Commonly known as:  MYLICON Chew 80 mg by mouth every 6 (six) hours as needed for flatulence.   torsemide 20 MG tablet Commonly known as:  DEMADEX Take 2 tablets (40 mg total) by mouth 2 (two) times daily.            Discharge Care Instructions  (From admission, onward)         Start     Ordered   04/27/18 0000  Discharge  wound care:    Comments:  Apply Criticaid clear to the right lower leg dry skin.  Cover wounds with AquaCel Ag+ hydrofiber.  Secure with kerlex   04/27/18 0909            Today   CHIEF COMPLAINT:  No acute issues overnight   VITAL SIGNS:  Blood pressure 110/68, pulse 73, temperature 98 F (36.7 C), temperature source Oral, resp. rate 18, height 6' (1.829 m), weight 80.4 kg, SpO2 100 %.   REVIEW OF SYSTEMS:  Review of Systems  Constitutional: Negative.  Negative for chills, fever and malaise/fatigue.  HENT: Negative.  Negative for ear discharge, ear pain, hearing loss, nosebleeds and sore throat.   Eyes: Negative.  Negative for blurred vision and pain.  Respiratory: Negative.  Negative for cough, hemoptysis, shortness of breath and wheezing.   Cardiovascular: Negative.  Negative for chest pain,  palpitations and leg swelling.  Gastrointestinal: Negative.  Negative for abdominal pain, blood in stool, diarrhea, nausea and vomiting.  Genitourinary: Negative.  Negative for dysuria.  Musculoskeletal: Negative.  Negative for back pain.  Skin: Negative.   Neurological: Negative for dizziness, tremors, speech change, focal weakness, seizures and headaches.  Endo/Heme/Allergies: Negative.  Does not bruise/bleed easily.  Psychiatric/Behavioral: Positive for memory loss. Negative for depression, hallucinations and suicidal ideas.     PHYSICAL EXAMINATION:  GENERAL:  82 y.o.-year-old patient lying in the bed with no acute distress.  NECK:  Supple, no jugular venous distention. No thyroid enlargement, no tenderness.  LUNGS: Normal breath sounds bilaterally, no wheezing, rales,rhonchi  No use of accessory muscles of respiration.  CARDIOVASCULAR: S1, S2 normal. 2/62murmurs, no rubs, or gallops.  ABDOMEN: Soft, non-tender, non-distended. Bowel sounds present. No organomegaly or mass.  EXTREMITIES: No pedal edema, cyanosis, or clubbing.  PSYCHIATRIC: The patient is alert and oriented x 3.   SKIN: No obvious rash, lesion, or ulcer.   DATA REVIEW:   CBC Recent Labs  Lab 04/27/18 0001 04/27/18 0804  WBC 6.0  --   HGB 9.0* 9.1*  HCT 28.9* 28.1*  PLT 195  --     Chemistries  Recent Labs  Lab 04/25/18 1812  04/27/18 0001  NA 139   < > 139  K 5.0   < > 4.5  CL 101   < > 103  CO2 28   < > 25  GLUCOSE 98   < > 130*  BUN 78*   < > 77*  CREATININE 2.02*   < > 2.08*  CALCIUM 8.8*   < > 8.6*  AST 16  --   --   ALT 8  --   --   ALKPHOS 79  --   --   BILITOT 0.5  --   --    < > = values in this interval not displayed.    Cardiac Enzymes No results for input(s): TROPONINI in the last 168 hours.  Microbiology Results  @MICRORSLT48 @  RADIOLOGY:  Nm Gi Blood Loss  Result Date: 04/26/2018 CLINICAL DATA:  Rectal bleed. EXAM: NUCLEAR MEDICINE GASTROINTESTINAL BLEEDING SCAN TECHNIQUE: Sequential abdominal images were obtained following intravenous administration of Tc-38m labeled red blood cells. RADIOPHARMACEUTICALS:  22.81 mCi Tc-34m pertechnetate in-vitro labeled red cells. COMPARISON:  None. FINDINGS: Midline activity is noted in the lower pelvis, likely penile activity. Normal blood pool activity. No visible active GI bleed identified. IMPRESSION: No identifiable GI bleed. Electronically Signed   By: Rolm Baptise M.D.   On: 04/26/2018 17:40      Allergies as of 04/27/2018   No Known Allergies     Medication List    STOP taking these medications   aspirin EC 81 MG tablet   dicloxacillin 500 MG capsule Commonly known as:  DYNAPEN     TAKE these medications   acetaminophen 325 MG tablet Commonly known as:  TYLENOL Take 975 mg by mouth every 8 (eight) hours as needed.   allopurinol 100 MG tablet Commonly known as:  ZYLOPRIM Take 0.5 tablets (50 mg total) by mouth daily.   amiodarone 100 MG tablet Commonly known as:  PACERONE Take 100 mg by mouth daily.   bisacodyl 10 MG suppository Commonly known as:  DULCOLAX Place 10 mg rectally daily as  needed for moderate constipation.   carvedilol 3.125 MG tablet Commonly known as:  COREG Take 1 tablet (3.125 mg total) by mouth 2 (two) times daily with a  meal.   citalopram 10 MG tablet Commonly known as:  CELEXA Take 10 mg by mouth daily.   dorzolamide 2 % ophthalmic solution Commonly known as:  TRUSOPT Place 1 drop into both eyes 2 (two) times daily.   eucerin cream Apply 1 application topically 2 (two) times daily.   GAVILAX powder Generic drug:  polyethylene glycol powder Take 17 g by mouth daily.   ipratropium-albuterol 0.5-2.5 (3) MG/3ML Soln Commonly known as:  DUONEB Take 3 mLs by nebulization every 6 (six) hours as needed.   latanoprost 0.005 % ophthalmic solution Commonly known as:  XALATAN Place 1 drop into both eyes at bedtime.   levothyroxine 112 MCG tablet Commonly known as:  SYNTHROID, LEVOTHROID Take 112 mcg by mouth daily before breakfast.   loratadine 10 MG tablet Commonly known as:  CLARITIN Take 10 mg by mouth daily.   Melatonin 3 MG Tabs Take 1 tablet by mouth at bedtime.   metolazone 2.5 MG tablet Commonly known as:  ZAROXOLYN Take 1 tablet (2.5 mg total) by mouth as needed. For a weight gain of 3 pounds overnight or 5 pounds in one week   potassium chloride SA 20 MEQ tablet Commonly known as:  K-DUR,KLOR-CON Take 20 mEq by mouth daily.   sennosides-docusate sodium 8.6-50 MG tablet Commonly known as:  SENOKOT-S Take 1 tablet by mouth 2 (two) times daily.   simethicone 80 MG chewable tablet Commonly known as:  MYLICON Chew 80 mg by mouth every 6 (six) hours as needed for flatulence.   torsemide 20 MG tablet Commonly known as:  DEMADEX Take 2 tablets (40 mg total) by mouth 2 (two) times daily.            Discharge Care Instructions  (From admission, onward)         Start     Ordered   04/27/18 0000  Discharge wound care:    Comments:  Apply Criticaid clear to the right lower leg dry skin.  Cover wounds with AquaCel Ag+  hydrofiber.  Secure with kerlex   04/27/18 3976             Management plans discussed with the patient and he is in agreement. Stable for discharge   Patient should follow up with pcp  CODE STATUS:     Code Status Orders  (From admission, onward)         Start     Ordered   04/26/18 0812  Full code  Continuous     04/26/18 0812        Code Status History    Date Active Date Inactive Code Status Order ID Comments User Context   04/25/2018 2258 04/26/2018 0812 Full Code [id number]  Gladstone Lighter, MD Inpatient   11/15/2017 2043 11/20/2017 2150 Full Code [id number]  Loletha Grayer, MD ED    Advance Directive Documentation     Most Recent Value  Type of Advance Directive  Healthcare Power of Attorney, Living will  Pre-existing out of facility DNR order (yellow form or pink MOST form)  -  "MOST" Form in Place?  -      TOTAL TIME TAKING CARE OF THIS PATIENT: 38 minutes.    Note: This dictation was prepared with Dragon dictation along with smaller phrase technology. Any transcriptional errors that result from this process are unintentional.  Ivery Nanney M.D on 04/27/2018 at 9:14 AM  Between 7am to 6pm - Pager - [phone] After 6pm go to [email or web address] - [place]  [facility]  Office  [phone]  [name]; [facility] [place]

## 2018-04-27 NOTE — Consult Note (Signed)
[place] Nurse wound consult note Patient examined in [facility] 209.  No family present. Reason for Consult: RLE wounds Wound type: Probable chronic venous stasis Measurements: Three distinct wound areas present.  From superior to inferior along the pre-tibial area the wounds measure:  1.5 cm x 1 cm; 3.2 cm x 1 cm; and 3.8 cm x 5.3 cm.  Wound beds are clean and pink. Drainage (amount, consistency, odor) Heavy amounts of yellow/brown drainage on existing dressing. Periwound: Extremely dry, crusted, hyperpigmented, thickened.  Consistent with Venous stasis.  Dressing procedure/placement/frequency: Apply Criticaid clear to the right lower leg dry skin.  Cover wounds with AquaCel Ag+ hydrofiber.  Secure with kerlex.  Change daily. This was performed by me during my exam. Monitor the wound area(s) for worsening of condition such as: Signs/symptoms of infection,  Increase in size,  Development of or worsening of odor, Development of pain, or increased pain at the affected locations.  Notify the medical team if any of these develop.  Thank you for the consult.  Discussed plan of care with the patient and bedside nurse.  [place] nurse will not follow at this time.  Please re-consult the [facility] team if needed.  Val Riles, RN, MSN, CWOCN, CNS-BC, pager [phone]

## 2018-04-27 NOTE — Clinical Social Work Note (Signed)
CSW noticed that patient has been discharged to return to [facility]. CSW spoke with patient regarding discharge and patient stated he was wanting to check on a lift for his wheelchair. When CSW asked for him to explain, patient had a difficult time in explaining what he was talking about. He did state that someone was needing the hospital to verify he could have one. CSW has reached out to [place] at [facility] to see what he may be referring to. CSW has notified Hilda Blades that patient is to return and discharge information has been sent. CSW left message with daughter: Alan Marshall: [phone], to notify of her father's discharge. Shela Leff MSW,LCSW [phone]

## 2018-04-28 LAB — COPPER, SERUM: Copper: 145 ug/dL (ref 72–166)

## 2018-04-28 LAB — HEMOGLOBIN A1C
HEMOGLOBIN A1C: 5.2 % (ref 4.8–5.6)
Mean Plasma Glucose: 103 mg/dL

## 2018-05-09 NOTE — Progress Notes (Signed)
Cardiology Office Note Date:  05/12/2018  Patient ID:  Alan Marshall, Alan Marshall 1936-03-29, MRN [id number] PCP:  [facility]  Cardiologist:  Dr. Fletcher Anon, MD    Chief Complaint: Hospital follow-up  History of Present Illness: Alan Marshall is a 82 y.o. male with history of HFrEF secondary to NICM, permanent Afib/flutter not on long term full-dose anticoagulation secondary to dementia, moderate to large circumferential pericardial effusion, CKD stage IV, recurrent left-sided pleural effusion s/p thoracentesis in 11/2017 with negative pathology, diabetes, HTN, hypothyroidism, dementia, and is mostly bed/wheelchair bound who presents for hospital follow-up after recent admission for GI bleed.  He was previously followed by [facility] and the [facility]. At one point he was status post ICD, though this has since been extracted in the setting of MSSA bacteremia in 2015 followed by subsequent sub-Q ICD that is followed at the [facility]. He was admitted to the hospital in 11/2017 with dyspnea and volume overload. Echo during that admission showed an EF of <20% with moderate mitral regurgitation, mild to moderate aortic insufficiency, moderate to large circumferential pericardial effusion without tamponade, and a PASP of 60 mmHg. Inpatient diuresis was complicated by CKD, though at discharge he was noted to have some improvement in his volume status with a discharge weight of 196 pounds. He was seen in hospital follow up in late 11/2017 with a weight down to 187 pounds. Follow up echo at that time showed a stable, moderate pericardial effusion. Follow up CXR in the setting of poor air movement on exam showed left greater than right pleural effusions with obscured/opacified left lower lung. In that setting, he was referred to pulmonology with follow up CXR demonstrating persistent collapse/consolidation of the left lung. He underwent thoracentesis in 7/19 with follow up CXR showing continued left much worse than  right basilar airspace disease with pulmonology feeling there was high likelihood of fluid recurrence with recommendation to not drain again in the setting of the patient's end-stage CHF unless it was causing dyspnea. Pathology on the pleural fluid was negative. He was seen on 8/27 and was stable with 2 pillow orthopnea and the head of the bed up about 30 degrees. His weight at the nursing home ranged between 195-198 pounds. He was noted to be much more volume overloaded than his prior visit in 11/2017 with a weight on 8/27 of 198 pounds (up 12 pounds compared to 11/2017 visit). BP was soft at 90/60. Of note, the patient weighed 197 pounds at his CHF Clinic visit on 01/20/18 with no changes being made.He was advised to take metolazone 2.5 mg every MWF and was continued on torsemide 40 mg bid. BMET checked on 02/03/18 showed a stable renal function and a BUN/SCr of 45/1.85 (prior SCr 2.3). He was continued on diuresis as above and KCl 40 mEq bid was added given a potassium of 3.4. Follow up BMET was advised in 1 week, though not completed. He was seen in the office on 10/29 and was doing well. His weight was down 21 pounds from his 01/2018 visit and he continued to take torsemide 40 mg bid along with metolazone 2.5 mg MWF. He had significant improvement in his lower extremity edema. His metolazone was held and he was continued on torsemide 40 mg bid. Labs checked that day showed a bump in his SCr to 2.42 with a potassium of 3.2. Echo on 04/23/18 to trend his pericardial effusion showed an EF of 20-25%, diffuse HK, mild to moderate AI, a moderate sized circumferential  pericardial effusion predominantly off the LV free wall measuring 4.48 cm with features not consistent with tamponade physiology. Follow up bmet on 04/23/18 showed improved SCr of 2.1 and K+ 4.1.  He was recently admitted to [facility] from 11/16-11/18 for BRBPR with a drop in HGB from 10/3-->8.1. Renal function was stable at 1.99-->2.08. Cardiology was asked to  evaluate the patient while admitted for risk stratification. He was felt to be euvolemic during the admission. He was noted to be be at least moderate, if not high risk for non-cardiac procedures with his risk possibly being exacerbated by his anemia. Bleeding scan was negative. His ASA was held. Endoscopic evaluation was not recommended by GI given his HGB was stable, he had no further BRBPR, and in the setting of his comorbid conditions. He did receive one unit of pRBC with a discharge HGB of 9.1.   Patient comes in today for hospital follow-up following his recent admission for GI bleed.  It is noted in the scheduling notes that today's visit was for preprocedure cardiac evaluation for potential endoscopy done by GI.  However, it should be noted GI has no plans to perform an endoscopy at this time given that his hemoglobin remained stable during his admission and he had no further BRBPR.  Patient comes in today noting he is fighting "a cold."  He states his rhinorrhea, nasal congestion, and postnasal drip are improving.  He reports he has done well from a cardiac perspective.  He denies any chest pain or shortness of breath.  He continues to note no lower extremity swelling.  He has a stable 2 pillow orthopnea.  He denies any early satiety or abdominal distention.  He reports compliance with torsemide 40 mg twice daily.  He reports a weight stable at his facility noted to be 177 pounds.  Of note, the patient did weigh 185 pounds by our scale today which is up 8 pounds from his visit in late 03/2018.  However, most recent weight provided from his facility on 12/2 showed a weight of 177 pounds which is consistent with his weight reported this morning as well.  He does not think he has needed any as needed metolazone.  He denies any further BRBPR.  He also denies any melena, hematuria, or hematemesis.  He does not have any questions or concerns at this time.  Past Medical History:  Diagnosis Date  . Anemia     . CKD (chronic kidney disease), stage IV ([place])   . Depression   . GERD (gastroesophageal reflux disease)   . Glaucoma   . Gout   . HFrEF (heart failure with reduced ejection fraction) ([place])    a. 11/2017 Echo: EF <20, diff HK. mild to mod AI. Asc Ao 39mm (mod dil). Mod MR. Mod dil LA/RV/RA. PASP 55mmHg. Mod to large circumferential pericardial effusion w/o tamponade; b. 12/05/2017 Echo: EF 15-20%, mod AI, mild to mod MR, mod TR, PASP 48mmHg, mod pericardial effusion w/o tamponade.  . Hypertension   . Hypothyroidism   . NICM (nonischemic cardiomyopathy) ([place])    a. S/p prior AICD->extracted in 2015 2/2 MSSA bacteremia; b. s/p Subcutaneous ICD; c. 11/2017 Echo: EF <20%; c. 12/05/2017 Echo: EF 15-20%.  . Pericardial effusion    a. 11/2017 Echo: Mod to large circumferential pericardial effusion w/o effusive-constrictive changes or tamponade physiology; b. 12/05/2017 Echo: EF 15-20%, mod pericardial effusion w/o hemodynamic compromise.  [name] Kitchen Permanent Atrial Fibrillation/Flutter ([place])    a. CHA2DS2VASc = 4-->No OAC 2/2 dementia.  [name] Kitchen  Recurrent left pleural effusion    a. 11/2017 s/p thoracentesis.  Path neg.  . Vitamin D deficiency     Past Surgical History:  Procedure Laterality Date  . INSERT / REPLACE / REMOVE PACEMAKER    . KNEE SURGERY Right     Current Meds  Medication Sig  . acetaminophen (TYLENOL) 325 MG tablet Take 975 mg by mouth every 8 (eight) hours as needed for mild pain, moderate pain or fever.   [name] Kitchen allopurinol (ZYLOPRIM) 100 MG tablet Take 0.5 tablets (50 mg total) by mouth daily. (Patient taking differently: Take 50 mg by mouth daily with supper. )  . amiodarone (PACERONE) 100 MG tablet Take 100 mg by mouth daily.  [name] Kitchen aspirin EC 81 MG tablet Take 81 mg by mouth daily.  . bisacodyl (DULCOLAX) 10 MG suppository Place 10 mg rectally daily as needed for moderate constipation.  . carvedilol (COREG) 3.125 MG tablet Take 1 tablet (3.125 mg total) by mouth 2 (two) times daily with a meal.   . citalopram (CELEXA) 10 MG tablet Take 10 mg by mouth daily.  . dicloxacillin (DYNAPEN) 500 MG capsule Take 500 mg by mouth every 8 (eight) hours.  . dorzolamide (TRUSOPT) 2 % ophthalmic solution Place 1 drop into both eyes 2 (two) times daily.  [name] Kitchen ipratropium-albuterol (DUONEB) 0.5-2.5 (3) MG/3ML SOLN Take 3 mLs by nebulization every 6 (six) hours as needed (for shortness of breath).   . latanoprost (XALATAN) 0.005 % ophthalmic solution Place 1 drop into both eyes at bedtime.  [name] Kitchen levothyroxine (SYNTHROID, LEVOTHROID) 112 MCG tablet Take 112 mcg by mouth daily before breakfast.  . loratadine (CLARITIN) 10 MG tablet Take 10 mg by mouth daily.  . Melatonin 3 MG TABS Take 3 mg by mouth at bedtime.   . Menthol-Zinc Oxide 0.44-20.625 % OINT Apply topically 2 (two) times daily. To scrotum  . metolazone (ZAROXOLYN) 2.5 MG tablet Take 1 tablet (2.5 mg total) by mouth as needed. For a weight gain of 3 pounds overnight or 5 pounds in one week (Patient taking differently: Take 2.5 mg by mouth daily as needed (for weight gain). )  . omeprazole (PRILOSEC) 20 MG capsule Take 20 mg by mouth daily.  . polyethylene glycol powder (GAVILAX) powder Take 17 g by mouth daily.  . potassium chloride SA (K-DUR,KLOR-CON) 20 MEQ tablet Take 20 mEq by mouth daily.  . sennosides-docusate sodium (SENOKOT-S) 8.6-50 MG tablet Take 1 tablet by mouth 2 (two) times daily.  . simethicone (MYLICON) 80 MG chewable tablet Chew 80 mg by mouth every 6 (six) hours as needed for flatulence.  . Skin Protectants, Misc. (EUCERIN) cream Apply 1 application topically 2 (two) times daily.  [name] Kitchen torsemide (DEMADEX) 20 MG tablet Take 2 tablets (40 mg total) by mouth 2 (two) times daily.    Allergies:   Patient has no known allergies.   Social History:  The patient  reports that he has never smoked. He has never used smokeless tobacco. He reports that he drank alcohol. He reports that he does not use drugs.   Family History:  The patient's family  history includes Hypertension in his father.  ROS:   Review of Systems  Constitutional: Positive for malaise/fatigue. Negative for chills, diaphoresis, fever and weight loss.  HENT: Positive for congestion and sore throat.        Rhinorrhea and postnasal drip  Eyes: Negative for discharge and redness.  Respiratory: Negative for cough, hemoptysis, sputum production, shortness of breath and wheezing.   Cardiovascular:  Negative for chest pain, palpitations, orthopnea, claudication, leg swelling and PND.  Gastrointestinal: Negative for abdominal pain, blood in stool, constipation, diarrhea, heartburn, melena, nausea and vomiting.  Genitourinary: Negative for hematuria.  Musculoskeletal: Negative for falls and myalgias.  Skin: Negative for rash.  Neurological: Positive for weakness. Negative for dizziness, tingling, tremors, sensory change, speech change, focal weakness and loss of consciousness.  Endo/Heme/Allergies: Does not bruise/bleed easily.  Psychiatric/Behavioral: Negative for substance abuse. The patient is not nervous/anxious.   All other systems reviewed and are negative.    PHYSICAL EXAM:  VS:  BP 106/68 (BP Location: Left Arm, Patient Position: Sitting, Cuff Size: Normal)   Pulse 63   Ht 6' 2.5" (1.892 m)   Wt 185 lb (83.9 kg)   BMI 23.43 kg/m  BMI: Body mass index is 23.43 kg/m.  Physical Exam  Constitutional: He is oriented to person, place, and time. He appears well-developed and well-nourished.  HENT:  Head: Normocephalic and atraumatic.  Eyes: Right eye exhibits no discharge. Left eye exhibits no discharge.  Neck: Normal range of motion. No JVD present.  Cardiovascular: Normal rate, regular rhythm, S1 normal and S2 normal. Exam reveals no distant heart sounds, no friction rub, no midsystolic click and no opening snap.  Murmur heard. Pulses:      Dorsalis pedis pulses are 2+ on the right side, and 2+ on the left side.       Posterior tibial pulses are 2+ on the  right side, and 2+ on the left side.  2/6 systolic murmur heard throughout  Pulmonary/Chest: Effort normal and breath sounds normal. No respiratory distress. He has no decreased breath sounds. He has no wheezes. He has no rales. He exhibits no tenderness.  Abdominal: Soft. He exhibits no distension. There is no tenderness.  Musculoskeletal: He exhibits no edema.  Neurological: He is alert and oriented to person, place, and time.  Skin: Skin is warm and dry. No cyanosis. Nails show no clubbing.  Psychiatric: He has a normal mood and affect. His speech is normal and behavior is normal. Judgment and thought content normal.     EKG:  Was ordered and interpreted by me today. Shows NSR, 63 bpm, Mobitz type I AV block, left axis deviation, possible prior inferior infarct, possible prior anteroseptal infarct  Recent Labs: 11/15/2017: B Natriuretic Peptide 2,886.0 11/17/2017: TSH 8.897 04/25/2018: ALT 8 04/27/2018: BUN 77; Creatinine, Ser 2.08; Hemoglobin 9.1; Platelets 195; Potassium 4.5; Sodium 139  04/26/2018: Cholesterol 192; HDL 49; LDL Cholesterol 123; Total CHOL/HDL Ratio 3.9; Triglycerides 98; VLDL 20   Estimated Creatinine Clearance: 33.4 mL/min (A) (by C-G formula based on SCr of 2.08 mg/dL (H)).   Wt Readings from Last 3 Encounters:  05/12/18 185 lb (83.9 kg)  04/27/18 177 lb 4 oz (80.4 kg)  04/22/18 175 lb (79.4 kg)     Other studies reviewed: Additional studies/records reviewed today include: summarized above  ASSESSMENT AND PLAN:  1. HFrEF secondary to an ICM: The patient does not appear grossly volume overloaded at this time and is well compensated from a cardiac perspective.  Recent echocardiogram as detailed above.  Continue torsemide 40 mg twice daily.  Would only use metolazone as needed for weight gain greater than 3 pounds overnight or 5 pounds in a week.  Continue Coreg 3.25 mg twice daily.  Not on an ACE inhibitor, ARB, spironolactone, Entresto in the setting of prior  relative hypotension and underlying CKD.  Check BMP today.  CHF education.  2. A. fib/flutter: Maintaining  sinus rhythm.  Not on oral anticoagulation secondary to dementia and comorbid conditions including recent GI bleed requiring blood transfusion.  Remains on amiodarone and Coreg.  3. Pericardial effusion: Stable by recent echocardiogram.  Asymptomatic.  Vital signs are stable.  No friction rub heard on exam today.  Consider repeat limited echocardiogram in follow-up.  4. GI bleed with recent BRBPR: Seen by GI while admitted with no plans for endoscopy given the patient's advanced age and comorbid conditions.  Check CBC today to ensure stability of hemoglobin.  Will defer further management to patient's PCP.  No longer on aspirin.  Patient indicates he was not aware of any pending procedures.  GI consult note corroborates this.  5. CKD stage IV: Check renal function.  Disposition: F/u with Dr. Fletcher Anon or an APP in 3 months.  Current medicines are reviewed at length with the patient today.  The patient did not have any concerns regarding medicines.  Signed, Christell Faith, PA-C 05/12/2018 3:28 PM     [facility] [address] [place] [place], [place] [address] [phone]

## 2018-05-12 ENCOUNTER — Ambulatory Visit: Payer: Medicare Other | Admitting: Physician Assistant

## 2018-05-12 ENCOUNTER — Ambulatory Visit (INDEPENDENT_AMBULATORY_CARE_PROVIDER_SITE_OTHER): Payer: Medicare Other | Admitting: Physician Assistant

## 2018-05-12 ENCOUNTER — Encounter: Payer: Self-pay | Admitting: Physician Assistant

## 2018-05-12 VITALS — BP 106/68 | HR 63 | Ht 74.5 in | Wt 185.0 lb

## 2018-05-12 DIAGNOSIS — I428 Other cardiomyopathies: Secondary | ICD-10-CM

## 2018-05-12 DIAGNOSIS — I4819 Other persistent atrial fibrillation: Secondary | ICD-10-CM | POA: Diagnosis not present

## 2018-05-12 DIAGNOSIS — Z79899 Other long term (current) drug therapy: Secondary | ICD-10-CM

## 2018-05-12 DIAGNOSIS — I3139 Other pericardial effusion (noninflammatory): Secondary | ICD-10-CM

## 2018-05-12 DIAGNOSIS — K922 Gastrointestinal hemorrhage, unspecified: Secondary | ICD-10-CM

## 2018-05-12 DIAGNOSIS — I313 Pericardial effusion (noninflammatory): Secondary | ICD-10-CM

## 2018-05-12 DIAGNOSIS — N184 Chronic kidney disease, stage 4 (severe): Secondary | ICD-10-CM

## 2018-05-12 DIAGNOSIS — I5022 Chronic systolic (congestive) heart failure: Secondary | ICD-10-CM | POA: Diagnosis not present

## 2018-05-12 NOTE — Patient Instructions (Signed)
Medication Instructions:  - Your physician recommends that you continue on your current medications as directed. Please refer to the Current Medication list given to you today.  If you need a refill on your cardiac medications before your next appointment, please call your pharmacy.   Lab work: - Your physician recommends that you have lab work today: BMP/ CBC  If you have labs (blood work) drawn today and your tests are completely normal, you will receive your results only by: [name] Kitchen MyChart Message (if you have MyChart) OR . A paper copy in the mail If you have any lab test that is abnormal or we need to change your treatment, we will call you to review the results.  Testing/Procedures: - none ordered  Follow-Up: At [facility], you and your health needs are our priority.  As part of our continuing mission to provide you with exceptional heart care, we have created designated Provider Care Teams.  These Care Teams include your primary Cardiologist (physician) and Advanced Practice Providers (APPs -  Physician Assistants and Nurse Practitioners) who all work together to provide you with the care you need, when you need it. . 3 months with Dr. Fletcher Anon APP  Any Other Special Instructions Will Be Listed Below (If Applicable). - N/A

## 2018-05-13 LAB — CBC WITH DIFFERENTIAL/PLATELET
Basophils Absolute: 0.1 10*3/uL (ref 0.0–0.2)
Basos: 1 %
EOS (ABSOLUTE): 0.5 10*3/uL — AB (ref 0.0–0.4)
Eos: 6 %
Hematocrit: 28.7 % — ABNORMAL LOW (ref 37.5–51.0)
Hemoglobin: 9.5 g/dL — ABNORMAL LOW (ref 13.0–17.7)
IMMATURE GRANS (ABS): 0 10*3/uL (ref 0.0–0.1)
Immature Granulocytes: 0 %
LYMPHS: 13 %
Lymphocytes Absolute: 1.1 10*3/uL (ref 0.7–3.1)
MCH: 29.1 pg (ref 26.6–33.0)
MCHC: 33.1 g/dL (ref 31.5–35.7)
MCV: 88 fL (ref 79–97)
MONOS ABS: 0.9 10*3/uL (ref 0.1–0.9)
Monocytes: 11 %
Neutrophils Absolute: 5.4 10*3/uL (ref 1.4–7.0)
Neutrophils: 69 %
PLATELETS: 174 10*3/uL (ref 150–450)
RBC: 3.26 x10E6/uL — AB (ref 4.14–5.80)
RDW: 13 % (ref 12.3–15.4)
WBC: 7.9 10*3/uL (ref 3.4–10.8)

## 2018-05-13 LAB — BASIC METABOLIC PANEL
BUN/Creatinine Ratio: 26 — ABNORMAL HIGH (ref 10–24)
BUN: 51 mg/dL — ABNORMAL HIGH (ref 8–27)
CO2: 23 mmol/L (ref 20–29)
CREATININE: 1.98 mg/dL — AB (ref 0.76–1.27)
Calcium: 8.7 mg/dL (ref 8.6–10.2)
Chloride: 102 mmol/L (ref 96–106)
GFR calc Af Amer: 36 mL/min/{1.73_m2} — ABNORMAL LOW (ref >59)
GFR calc non Af Amer: 31 mL/min/{1.73_m2} — ABNORMAL LOW (ref >59)
GLUCOSE: 128 mg/dL — AB (ref 65–99)
POTASSIUM: 4.4 mmol/L (ref 3.5–5.2)
SODIUM: 141 mmol/L (ref 134–144)

## 2018-06-19 ENCOUNTER — Encounter: Payer: Self-pay | Admitting: Nurse Practitioner

## 2018-06-19 ENCOUNTER — Non-Acute Institutional Stay: Payer: Medicare Other | Admitting: Nurse Practitioner

## 2018-06-19 VITALS — HR 82 | Resp 20 | Wt 181.0 lb

## 2018-06-19 DIAGNOSIS — R601 Generalized edema: Secondary | ICD-10-CM

## 2018-06-19 DIAGNOSIS — Z515 Encounter for palliative care: Secondary | ICD-10-CM

## 2018-06-19 DIAGNOSIS — R5381 Other malaise: Secondary | ICD-10-CM

## 2018-06-19 NOTE — Progress Notes (Signed)
[facility] Telephone: [phone] Fax: [phone]  PATIENT NAME: Alan Marshall DOB: 07-17-1935 MRN: [id number]  PRIMARY CARE PROVIDER:   Center,  PROVIDER:  Dr Slade-Hermann/[facility] RESPONSIBLE PARTY:   Talan Gildner daughter [phone] Alan Marshall has three children who live in [place]. 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 He then returned to [facility].  ASSESSMENT:     I visited and observed Alan Marshall. We talked about purpose her palliative medicine visit and he was in agreement, consented for treatment. We talked about how he was feeling. He endorses that he's lying in bed so he stays out of trouble. He talked about his behaviors and saying inappropriate comments to staff. We talked about coping strategies. He talked about loss of [place] and residing in [place]. We talked about coping strategies. We talked about is functional impairment and his mobility and his electric wheelchair. We talked about the importance of going to the dining area for meals, activities and social interaction. We talked about symptoms of pain and shortness of breath but he denies. Edema has improved. We talked about his children in [place]. We talked about his goals of residing in [place]. We talked about code status as he continues to wish for full code and aggressive interventions. We talked about role of palliative care and plan of care. I have attempted to contact his daughter, unable to leave a message but will continue to try for update on palliative care visit. Therapeutic listening and emotional support provided. Questions answered satisfaction. I updated nursing staff and no changes to current goals or plan of care.  12 / 11 / 2019 sodium 142, potassium 4.2, chloride 105, co2 24, calcium 8.8, bun 51.8,  creatinine 1.93, glucose 123, WBC 6.1, hemoglobin 9.4, hemocrit 28.8, platelets 190  12 / 20 / 2019 albumin 3.8, total protein 7.6  Palliative care 6 / 25 / 2019 Hospice 3 / 17 / 2018 to 6 / 14 / 2018  8 / 26 / 2018 weight 198.6 lb 10 / 10 / 2019 weight 179.8 lb 1 / 9 / 2019 weight 181.0 lb   RECOMMENDATIONS and PLAN:   1. Palliative care encounter Z51.5; Palliative medicine team will continue to support patient, patient's family, and medical team. Visit consisted of counseling and education dealing with the complex and emotionally intense issues of symptom management and palliative care in the setting of serious and potentially life-threatening illness  2. Edema R60.9 secondary to CHF, monitor weights, elevate  3. Debility R53.81 secondary to CHF, obesity encourage passive rom; encourage to transfer to electric w/c; encouraged mobility  I spent 60 minutes providing this consultation,  from 2:30pm to 3:30pm. More than 50% of the time in this consultation was spent coordinating communication.   HISTORY OF PRESENT ILLNESS:  Alan Marshall is a 83 y.o. year old male with multiple medical problems including Obesity, Congestive heart failure, atrial flutter, chronic kidney disease previously under hemodialysis but no longer required, hypertension, hypothyroidism, anemia, glaucoma, vitamin D deficiency. He continues to reset at still [place]. He is a total lift to the electric wheelchair where he is mobile throughout the facility. He is total ADL dependence with incontinence. He does feed himself an appetite has been Fair. Last palliative care visit 10 / 17 / 2019 focus on medical goals of care including code  status and witches are progressive interventions with full code. Last primary provider visit 12 / 10 / 2019 for behaviors including calling [employer] requesting to adopt dogs and have them sent to his home in [place] as well as cursing at staff and running over others in his  motorized wheelchair. Labs completed. No recent Falls, wounds, infections, hospitalizations. Staff endorses he has been about the same, stable. At present on these lying in bed. He appears comfortable though debilitated. No visitors present. Palliative Care was asked to help address goals of care.   CODE STATUS: full  PPS: 50% HOSPICE ELIGIBILITY/DIAGNOSIS: TBD  PAST MEDICAL HISTORY:  Past Medical History:  Diagnosis Date  . Anemia   . CKD (chronic kidney disease), stage IV ([place])   . Depression   . GERD (gastroesophageal reflux disease)   . Glaucoma   . Gout   . HFrEF (heart failure with reduced ejection fraction) ([place])    a. 11/2017 Echo: EF <20, diff HK. mild to mod AI. Asc Ao 75mm (mod dil). Mod MR. Mod dil LA/RV/RA. PASP 103mmHg. Mod to large circumferential pericardial effusion w/o tamponade; b. 12/05/2017 Echo: EF 15-20%, mod AI, mild to mod MR, mod TR, PASP 48mmHg, mod pericardial effusion w/o tamponade.  . Hypertension   . Hypothyroidism   . NICM (nonischemic cardiomyopathy) ([place])    a. S/p prior AICD->extracted in 2015 2/2 MSSA bacteremia; b. s/p Subcutaneous ICD; c. 11/2017 Echo: EF <20%; c. 12/05/2017 Echo: EF 15-20%.  . Pericardial effusion    a. 11/2017 Echo: Mod to large circumferential pericardial effusion w/o effusive-constrictive changes or tamponade physiology; b. 12/05/2017 Echo: EF 15-20%, mod pericardial effusion w/o hemodynamic compromise.  [name] Kitchen Permanent Atrial Fibrillation/Flutter ([place])    a. CHA2DS2VASc = 4-->No OAC 2/2 dementia.  . Recurrent left pleural effusion    a. 11/2017 s/p thoracentesis.  Path neg.  . Vitamin D deficiency     SOCIAL HX:  Social History   Tobacco Use  . Smoking status: Never Smoker  . Smokeless tobacco: Never Used  Substance Use Topics  . Alcohol use: Not Currently    ALLERGIES: No Known Allergies   PERTINENT MEDICATIONS:  Outpatient Encounter Medications as of 06/19/2018  Medication Sig  . acetaminophen (TYLENOL) 325 MG tablet Take  975 mg by mouth every 8 (eight) hours as needed for mild pain, moderate pain or fever.   [name] Kitchen allopurinol (ZYLOPRIM) 100 MG tablet Take 0.5 tablets (50 mg total) by mouth daily. (Patient taking differently: Take 50 mg by mouth daily with supper. )  . amiodarone (PACERONE) 100 MG tablet Take 100 mg by mouth daily.  [name] Kitchen aspirin EC 81 MG tablet Take 81 mg by mouth daily.  . bisacodyl (DULCOLAX) 10 MG suppository Place 10 mg rectally daily as needed for moderate constipation.  . carvedilol (COREG) 3.125 MG tablet Take 1 tablet (3.125 mg total) by mouth 2 (two) times daily with a meal.  . citalopram (CELEXA) 10 MG tablet Take 10 mg by mouth daily.  . dicloxacillin (DYNAPEN) 500 MG capsule Take 500 mg by mouth every 8 (eight) hours.  . dorzolamide (TRUSOPT) 2 % ophthalmic solution Place 1 drop into both eyes 2 (two) times daily.  [name] Kitchen ipratropium-albuterol (DUONEB) 0.5-2.5 (3) MG/3ML SOLN Take 3 mLs by nebulization every 6 (six) hours as needed (for shortness of breath).   . latanoprost (XALATAN) 0.005 % ophthalmic solution Place 1 drop into both eyes at bedtime.  [name] Kitchen levothyroxine (SYNTHROID, LEVOTHROID) 112 MCG tablet Take 112 mcg by mouth daily before  breakfast.  . loratadine (CLARITIN) 10 MG tablet Take 10 mg by mouth daily.  . Melatonin 3 MG TABS Take 3 mg by mouth at bedtime.   . Menthol-Zinc Oxide 0.44-20.625 % OINT Apply topically 2 (two) times daily. To scrotum  . metolazone (ZAROXOLYN) 2.5 MG tablet Take 1 tablet (2.5 mg total) by mouth as needed. For a weight gain of 3 pounds overnight or 5 pounds in one week (Patient taking differently: Take 2.5 mg by mouth daily as needed (for weight gain). )  . omeprazole (PRILOSEC) 20 MG capsule Take 20 mg by mouth daily.  . polyethylene glycol powder (GAVILAX) powder Take 17 g by mouth daily.  . potassium chloride SA (K-DUR,KLOR-CON) 20 MEQ tablet Take 20 mEq by mouth daily.  . sennosides-docusate sodium (SENOKOT-S) 8.6-50 MG tablet Take 1 tablet by mouth 2 (two)  times daily.  . simethicone (MYLICON) 80 MG chewable tablet Chew 80 mg by mouth every 6 (six) hours as needed for flatulence.  . Skin Protectants, Misc. (EUCERIN) cream Apply 1 application topically 2 (two) times daily.  [name] Kitchen torsemide (DEMADEX) 20 MG tablet Take 2 tablets (40 mg total) by mouth 2 (two) times daily.   No facility-administered encounter medications on file as of 06/19/2018.     PHYSICAL EXAM:   General: obese, debilitated, pleasant male Cardiovascular: regular rate and rhythm Pulmonary: clear ant fields Abdomen: soft, nontender, + bowel sounds GU: no suprapubic tenderness Extremities: no edema, no joint deformities Skin: no rashes Neurological: Weakness but otherwise nonfocal/non-ambulatory w/c dependent  Christin Ihor Gully, NP

## 2018-07-10 ENCOUNTER — Encounter: Payer: Self-pay | Admitting: Cardiovascular Disease

## 2018-07-10 ENCOUNTER — Ambulatory Visit (INDEPENDENT_AMBULATORY_CARE_PROVIDER_SITE_OTHER): Payer: Medicare Other | Admitting: Cardiovascular Disease

## 2018-07-10 VITALS — BP 100/58 | HR 60 | Ht 74.5 in | Wt 180.4 lb

## 2018-07-10 DIAGNOSIS — I480 Paroxysmal atrial fibrillation: Secondary | ICD-10-CM | POA: Diagnosis not present

## 2018-07-10 DIAGNOSIS — I3139 Other pericardial effusion (noninflammatory): Secondary | ICD-10-CM

## 2018-07-10 DIAGNOSIS — I5022 Chronic systolic (congestive) heart failure: Secondary | ICD-10-CM

## 2018-07-10 DIAGNOSIS — I313 Pericardial effusion (noninflammatory): Secondary | ICD-10-CM

## 2018-07-10 DIAGNOSIS — N184 Chronic kidney disease, stage 4 (severe): Secondary | ICD-10-CM

## 2018-07-10 NOTE — Patient Instructions (Signed)
Medication Instructions:  No changes If you need a refill on your cardiac medications before your next appointment, please call your pharmacy.   Lab work: None ordered  Testing/Procedures: None ordered  Follow-Up: At [facility], you and your health needs are our priority.  As part of our continuing mission to provide you with exceptional heart care, we have created designated Provider Care Teams.  These Care Teams include your primary Cardiologist (physician) and Advanced Practice Providers (APPs -  Physician Assistants and Nurse Practitioners) who all work together to provide you with the care you need, when you need it. . You will need a follow up appointment in 4 months with Christell Faith, PA

## 2018-07-10 NOTE — Progress Notes (Signed)
Cardiology Office Note   Date:  07/10/2018   ID:  Alan Marshall, DOB 04-Oct-1935, MRN [id number]  PCP:  [facility]  Cardiologist:   Kathlyn Sacramento, MD   Chief Complaint  Patient presents with  . other    3 month follow up. Meds reviewed by the pt.'s med list from [facility]. Pt. c/o LE edema.       History of Present Illness: Alan Marshall is a 83 y.o. male who presents for a follow-up visit regarding chronic systolic heart failure due to nonischemic cardiomyopathy, and atrial fibrillation/flutter. He is not on long-term anticoagulation due to dementia.  He has other chronic medical conditions that include moderate to large size pericardial effusion, stage IV chronic kidney disease, history of left-sided pleural effusion status post thoracentesis in June 2019 with negative pathology, type 2 diabetes, hypertension and hypothyroidism.  The patient is mostly bed and wheelchair bound. Echocardiogram in June 2019 showed an EF of less than 20% with moderate mitral regurgitation, mild to moderate aortic insufficiency, moderate to large size pericardial effusion and moderate pulmonary hypertension. He was hospitalized in November with lower GI bleed.  Hemoglobin dropped to 8.1.  His aspirin was stopped.  Endoscopic evaluation was not recommended.  He has been doing reasonably well overall.  He reports significant improvement in shortness of breath and leg edema.  His only complaint seems to be urinating too much.  He is on torsemide 40 mg twice daily.  His weight has been stable.  Past Medical History:  Diagnosis Date  . Anemia   . CKD (chronic kidney disease), stage IV ([place])   . Depression   . GERD (gastroesophageal reflux disease)   . Glaucoma   . Gout   . HFrEF (heart failure with reduced ejection fraction) ([place])    a. 11/2017 Echo: EF <20, diff HK. mild to mod AI. Asc Ao 63mm (mod dil). Mod MR. Mod dil LA/RV/RA. PASP 20mmHg. Mod to large circumferential pericardial effusion w/o  tamponade; b. 12/05/2017 Echo: EF 15-20%, mod AI, mild to mod MR, mod TR, PASP 20mmHg, mod pericardial effusion w/o tamponade.  . Hypertension   . Hypothyroidism   . NICM (nonischemic cardiomyopathy) ([place])    a. S/p prior AICD->extracted in 2015 2/2 MSSA bacteremia; b. s/p Subcutaneous ICD; c. 11/2017 Echo: EF <20%; c. 12/05/2017 Echo: EF 15-20%.  . Pericardial effusion    a. 11/2017 Echo: Mod to large circumferential pericardial effusion w/o effusive-constrictive changes or tamponade physiology; b. 12/05/2017 Echo: EF 15-20%, mod pericardial effusion w/o hemodynamic compromise.  [name] Kitchen Permanent Atrial Fibrillation/Flutter ([place])    a. CHA2DS2VASc = 4-->No OAC 2/2 dementia.  . Recurrent left pleural effusion    a. 11/2017 s/p thoracentesis.  Path neg.  . Vitamin D deficiency     Past Surgical History:  Procedure Laterality Date  . INSERT / REPLACE / REMOVE PACEMAKER    . KNEE SURGERY Right      Current Outpatient Medications  Medication Sig Dispense Refill  . acetaminophen (TYLENOL) 325 MG tablet Take 975 mg by mouth every 8 (eight) hours as needed for mild pain, moderate pain or fever.     [name] Kitchen allopurinol (ZYLOPRIM) 100 MG tablet Take 0.5 tablets (50 mg total) by mouth daily. (Patient taking differently: Take 50 mg by mouth daily with supper. ) 30 tablet 0  . amiodarone (PACERONE) 100 MG tablet Take 100 mg by mouth daily.    [name] Kitchen aspirin EC 81 MG tablet Take 81 mg by mouth daily.    [name] Kitchen  bisacodyl (DULCOLAX) 10 MG suppository Place 10 mg rectally daily as needed for moderate constipation.    . carvedilol (COREG) 3.125 MG tablet Take 1 tablet (3.125 mg total) by mouth 2 (two) times daily with a meal. 60 tablet 1  . citalopram (CELEXA) 10 MG tablet Take 10 mg by mouth daily.    . dicloxacillin (DYNAPEN) 500 MG capsule Take 500 mg by mouth every 8 (eight) hours.    . dorzolamide (TRUSOPT) 2 % ophthalmic solution Place 1 drop into both eyes 2 (two) times daily.    [name] Kitchen ipratropium-albuterol (DUONEB) 0.5-2.5  (3) MG/3ML SOLN Take 3 mLs by nebulization every 6 (six) hours as needed (for shortness of breath).     . latanoprost (XALATAN) 0.005 % ophthalmic solution Place 1 drop into both eyes at bedtime.    [name] Kitchen levothyroxine (SYNTHROID, LEVOTHROID) 112 MCG tablet Take 112 mcg by mouth daily before breakfast.    . loratadine (CLARITIN) 10 MG tablet Take 10 mg by mouth daily.    . Melatonin 3 MG TABS Take 3 mg by mouth at bedtime.     . Menthol-Zinc Oxide 0.44-20.625 % OINT Apply topically 2 (two) times daily. To scrotum    . polyethylene glycol powder (GAVILAX) powder Take 17 g by mouth daily.    . potassium chloride SA (K-DUR,KLOR-CON) 20 MEQ tablet Take 20 mEq by mouth daily.    . sennosides-docusate sodium (SENOKOT-S) 8.6-50 MG tablet Take 1 tablet by mouth 2 (two) times daily.    . simethicone (MYLICON) 80 MG chewable tablet Chew 80 mg by mouth every 6 (six) hours as needed for flatulence.    . Skin Protectants, Misc. (EUCERIN) cream Apply 1 application topically 2 (two) times daily.    [name] Kitchen torsemide (DEMADEX) 20 MG tablet Take 2 tablets (40 mg total) by mouth 2 (two) times daily. 60 tablet 1  . metolazone (ZAROXOLYN) 2.5 MG tablet Take 1 tablet (2.5 mg total) by mouth as needed. For a weight gain of 3 pounds overnight or 5 pounds in one week (Patient taking differently: Take 2.5 mg by mouth daily as needed (for weight gain). ) 45 tablet 3   No current facility-administered medications for this visit.     Allergies:   Patient has no known allergies.    Social History:  The patient  reports that he has never smoked. He has never used smokeless tobacco. He reports previous alcohol use. He reports that he does not use drugs.   Family History:  The patient's family history includes Hypertension in his father.    ROS:  Please see the history of present illness.   Otherwise, review of systems are positive for none.   All other systems are reviewed and negative.    PHYSICAL EXAM: VS:  BP (!) 100/58 (BP  Location: Right Arm, Patient Position: Sitting, Cuff Size: Normal)   Pulse 60   Ht 6' 2.5" (1.892 m)   Wt 180 lb 6 oz (81.8 kg) Comment: check at [facility] on 07/09/2018  BMI 22.85 kg/m  , BMI Body mass index is 22.85 kg/m. GEN: Well nourished, well developed, in no acute distress  HEENT: normal  Neck: Mild JVD, carotid bruits, or masses Cardiac: RRR; no  rubs, or gallops, 2 out of 6 systolic murmur in the aortic area.  Trace bilateral leg edema Respiratory: Few bibasilar crackles GI: soft, nontender, nondistended, + BS MS: no deformity or atrophy  Skin: warm and dry, no rash Neuro:  Strength and sensation are intact Psych:  euthymic mood, full affect   EKG:  EKG is ordered today. The ekg ordered today demonstrates sinus rhythm with first-degree AV block, left anterior fascicular block with possible old anterolateral infarct.   Recent Labs: 11/15/2017: B Natriuretic Peptide 2,886.0 11/17/2017: TSH 8.897 04/25/2018: ALT 8 05/12/2018: BUN 51; Creatinine, Ser 1.98; Hemoglobin 9.5; Platelets 174; Potassium 4.4; Sodium 141    Lipid Panel    Component Value Date/Time   CHOL 192 04/26/2018 0610   TRIG 98 04/26/2018 0610   HDL 49 04/26/2018 0610   CHOLHDL 3.9 04/26/2018 0610   VLDL 20 04/26/2018 0610   LDLCALC 123 (H) 04/26/2018 0610      Wt Readings from Last 3 Encounters:  07/10/18 180 lb 6 oz (81.8 kg)  06/19/18 181 lb (82.1 kg)  05/12/18 185 lb (83.9 kg)       No flowsheet data found.    ASSESSMENT AND PLAN:  1.  Chronic systolic heart failure: He seems to be only mildly volume overloaded but overall his weight has been stable on the current dose of torsemide 40 mg twice daily.  Metolazone can be used as needed for weight gain of 3 pounds overnight or 5 pounds in 1 week. Continue small dose carvedilol which cannot be increased given relatively low blood pressure.  No ACE inhibitors or ARB is given advanced chronic kidney disease.  2.  Paroxysmal atrial fibrillation: Not on  anticoagulation due to dementia and recent GI bleed.  He is currently maintaining in sinus rhythm with small dose amiodarone.  3.  Pericardial effusion: This has been stable on most recent echocardiogram.  Given his comorbidities, recommend conservative management and less he becomes hemodynamically unstable.  4.  Stage IV chronic kidney disease: Renal function has been stable.   Disposition:   FU in 4 months.  Signed,  Kathlyn Sacramento, MD  07/10/2018 10:53 AM    [place]

## 2018-08-13 ENCOUNTER — Ambulatory Visit (INDEPENDENT_AMBULATORY_CARE_PROVIDER_SITE_OTHER): Payer: Medicare Other | Admitting: Cardiovascular Disease

## 2018-08-13 ENCOUNTER — Encounter: Payer: Self-pay | Admitting: Cardiovascular Disease

## 2018-08-13 VITALS — BP 110/62 | HR 64 | Ht 74.0 in | Wt 197.0 lb

## 2018-08-13 DIAGNOSIS — N184 Chronic kidney disease, stage 4 (severe): Secondary | ICD-10-CM | POA: Diagnosis not present

## 2018-08-13 DIAGNOSIS — I313 Pericardial effusion (noninflammatory): Secondary | ICD-10-CM | POA: Diagnosis not present

## 2018-08-13 DIAGNOSIS — I480 Paroxysmal atrial fibrillation: Secondary | ICD-10-CM

## 2018-08-13 DIAGNOSIS — I3139 Other pericardial effusion (noninflammatory): Secondary | ICD-10-CM

## 2018-08-13 DIAGNOSIS — I5022 Chronic systolic (congestive) heart failure: Secondary | ICD-10-CM

## 2018-08-13 NOTE — Progress Notes (Signed)
Cardiology Office Note   Date:  08/13/2018   ID:  Alan Marshall, DOB 09-17-1935, MRN [id number]  PCP:  [facility]  Cardiologist:   Kathlyn Sacramento, MD   Chief Complaint  Patient presents with  . Other    3 month follow up. Patient c.o SOB. Meds reviewed verbally with patient.       History of Present Illness: Alan Marshall is a 83 y.o. male who presents for a follow-up visit regarding chronic systolic heart failure due to nonischemic cardiomyopathy and atrial fibrillation/flutter. He is not on long-term anticoagulation due to dementia.  He has other chronic medical conditions that include moderate to large size pericardial effusion, stage IV chronic kidney disease, history of left-sided pleural effusion status post thoracentesis in June 2019 with negative pathology, type 2 diabetes, hypertension and hypothyroidism.  The patient is mostly bed and wheelchair bound. Echocardiogram in June 2019 showed an EF of less than 20% with moderate mitral regurgitation, mild to moderate aortic insufficiency, moderate to large size pericardial effusion and moderate pulmonary hypertension. He was hospitalized in November with lower GI bleed.  Hemoglobin dropped to 8.1.  His aspirin was stopped.  Endoscopic evaluation was not recommended.  He has been doing reasonably well overall.  He reports improvement of leg edema compared to before.  His weight is up today but measurement does not seem to be accurate.  He denies chest pain or shortness of breath.  Takes torsemide 40 mg twice daily and there is an order for metolazone to be used as needed for weight gain.  Letter needed  Past Medical History:  Diagnosis Date  . Anemia   . CKD (chronic kidney disease), stage IV ([place])   . Depression   . GERD (gastroesophageal reflux disease)   . Glaucoma   . Gout   . HFrEF (heart failure with reduced ejection fraction) ([place])    a. 11/2017 Echo: EF <20, diff HK. mild to mod AI. Asc Ao 50mm (mod dil).  Mod MR. Mod dil LA/RV/RA. PASP 82mmHg. Mod to large circumferential pericardial effusion w/o tamponade; b. 12/05/2017 Echo: EF 15-20%, mod AI, mild to mod MR, mod TR, PASP 16mmHg, mod pericardial effusion w/o tamponade.  . Hypertension   . Hypothyroidism   . NICM (nonischemic cardiomyopathy) ([place])    a. S/p prior AICD->extracted in 2015 2/2 MSSA bacteremia; b. s/p Subcutaneous ICD; c. 11/2017 Echo: EF <20%; c. 12/05/2017 Echo: EF 15-20%.  . Pericardial effusion    a. 11/2017 Echo: Mod to large circumferential pericardial effusion w/o effusive-constrictive changes or tamponade physiology; b. 12/05/2017 Echo: EF 15-20%, mod pericardial effusion w/o hemodynamic compromise.  [name] Kitchen Permanent Atrial Fibrillation/Flutter ([place])    a. CHA2DS2VASc = 4-->No OAC 2/2 dementia.  . Recurrent left pleural effusion    a. 11/2017 s/p thoracentesis.  Path neg.  . Vitamin D deficiency     Past Surgical History:  Procedure Laterality Date  . INSERT / REPLACE / REMOVE PACEMAKER    . KNEE SURGERY Right      Current Outpatient Medications  Medication Sig Dispense Refill  . acetaminophen (TYLENOL) 325 MG tablet Take 975 mg by mouth every 8 (eight) hours as needed for mild pain, moderate pain or fever.     [name] Kitchen allopurinol (ZYLOPRIM) 100 MG tablet Take 0.5 tablets (50 mg total) by mouth daily. (Patient taking differently: Take 50 mg by mouth daily with supper. ) 30 tablet 0  . amiodarone (PACERONE) 100 MG tablet Take 100 mg by mouth daily.    [name] Kitchen  bisacodyl (DULCOLAX) 10 MG suppository Place 10 mg rectally daily as needed for moderate constipation.    . carvedilol (COREG) 3.125 MG tablet Take 1 tablet (3.125 mg total) by mouth 2 (two) times daily with a meal. 60 tablet 1  . citalopram (CELEXA) 10 MG tablet Take 10 mg by mouth daily.    . dicloxacillin (DYNAPEN) 500 MG capsule Take 500 mg by mouth every 8 (eight) hours.    . dorzolamide (TRUSOPT) 2 % ophthalmic solution Place 1 drop into both eyes 2 (two) times daily.    [name] Kitchen  ipratropium-albuterol (DUONEB) 0.5-2.5 (3) MG/3ML SOLN Take 3 mLs by nebulization every 6 (six) hours as needed (for shortness of breath).     . latanoprost (XALATAN) 0.005 % ophthalmic solution Place 1 drop into both eyes at bedtime.    [name] Kitchen levothyroxine (SYNTHROID, LEVOTHROID) 112 MCG tablet Take 112 mcg by mouth daily before breakfast.    . loratadine (CLARITIN) 10 MG tablet Take 10 mg by mouth daily.    . Melatonin 3 MG TABS Take 3 mg by mouth at bedtime.     . Menthol-Zinc Oxide 0.44-20.625 % OINT Apply topically 2 (two) times daily. To scrotum    . polyethylene glycol powder (GAVILAX) powder Take 17 g by mouth daily.    . potassium chloride SA (K-DUR,KLOR-CON) 20 MEQ tablet Take 20 mEq by mouth daily.    . sennosides-docusate sodium (SENOKOT-S) 8.6-50 MG tablet Take 1 tablet by mouth 2 (two) times daily.    . simethicone (MYLICON) 80 MG chewable tablet Chew 80 mg by mouth every 6 (six) hours as needed for flatulence.    . Skin Protectants, Misc. (EUCERIN) cream Apply 1 application topically 2 (two) times daily.    [name] Kitchen torsemide (DEMADEX) 20 MG tablet Take 2 tablets (40 mg total) by mouth 2 (two) times daily. 60 tablet 1  . metolazone (ZAROXOLYN) 2.5 MG tablet Take 1 tablet (2.5 mg total) by mouth as needed. For a weight gain of 3 pounds overnight or 5 pounds in one week (Patient taking differently: Take 2.5 mg by mouth daily as needed (for weight gain). ) 45 tablet 3   No current facility-administered medications for this visit.     Allergies:   Patient has no known allergies.    Social History:  The patient  reports that he has never smoked. He has never used smokeless tobacco. He reports previous alcohol use. He reports that he does not use drugs.   Family History:  The patient's family history includes Hypertension in his father.    ROS:  Please see the history of present illness.   Otherwise, review of systems are positive for none.   All other systems are reviewed and negative.     PHYSICAL EXAM: VS:  BP 110/62 (BP Location: Left Arm, Patient Position: Sitting, Cuff Size: Normal)   Pulse 64   Ht 6\' 2"  (1.88 m)   Wt 197 lb (89.4 kg)   BMI 25.29 kg/m  , BMI Body mass index is 25.29 kg/m. GEN: Well nourished, well developed, in no acute distress  HEENT: normal  Neck: Mild JVD, carotid bruits, or masses Cardiac: RRR; no  rubs, or gallops, 2 /6 systolic murmur in the aortic area.  Trace bilateral leg edema Respiratory: Few bibasilar crackles GI: soft, nontender, nondistended, + BS MS: no deformity or atrophy  Skin: warm and dry, no rash Neuro:  Strength and sensation are intact Psych: euthymic mood, full affect   EKG:  EKG is  ordered today. The ekg ordered today demonstrates sinus rhythm with first-degree AV block, left anterior fascicular block with possible old anterolateral infarct.   Recent Labs: 11/15/2017: B Natriuretic Peptide 2,886.0 11/17/2017: TSH 8.897 04/25/2018: ALT 8 05/12/2018: BUN 51; Creatinine, Ser 1.98; Hemoglobin 9.5; Platelets 174; Potassium 4.4; Sodium 141    Lipid Panel    Component Value Date/Time   CHOL 192 04/26/2018 0610   TRIG 98 04/26/2018 0610   HDL 49 04/26/2018 0610   CHOLHDL 3.9 04/26/2018 0610   VLDL 20 04/26/2018 0610   LDLCALC 123 (H) 04/26/2018 0610      Wt Readings from Last 3 Encounters:  08/13/18 197 lb (89.4 kg)  07/10/18 180 lb 6 oz (81.8 kg)  06/19/18 181 lb (82.1 kg)       No flowsheet data found.    ASSESSMENT AND PLAN:  1.  Chronic systolic heart failure: His weight has increased but he has no significant leg edema and appears to be only mildly volume overloaded.  Continue torsemide 40 mg twice daily and use metolazone as needed.  Continue small dose carvedilol which cannot be increased given relatively low blood pressure and heart rate.  No ACE inhibitors or ARB is given advanced chronic kidney disease.  2.  Paroxysmal atrial fibrillation: Not on anticoagulation due to dementia and recent GI  bleed.  He is currently maintaining in sinus rhythm with small dose amiodarone.   3.  Pericardial effusion: This has been stable on most recent echocardiogram.  Given his comorbidities, recommend conservative management and less he becomes hemodynamically unstable.  4.  Stage IV chronic kidney disease: Renal function has been stable.   Disposition:   FU in 4 months.  Signed,  Kathlyn Sacramento, MD  08/13/2018 10:10 AM    [place]

## 2018-08-13 NOTE — Patient Instructions (Signed)
Medication Instructions:  No changes If you need a refill on your cardiac medications before your next appointment, please call your pharmacy.   Lab work: None If you have labs (blood work) drawn today and your tests are completely normal, you will receive your results only by: [name] Kitchen MyChart Message (if you have MyChart) OR . A paper copy in the mail If you have any lab test that is abnormal or we need to change your treatment, we will call you to review the results.  Testing/Procedures: None  Follow-Up: At [facility], you and your health needs are our priority.  As part of our continuing mission to provide you with exceptional heart care, we have created designated Provider Care Teams.  These Care Teams include your primary Cardiologist (physician) and Advanced Practice Providers (APPs -  Physician Assistants and Nurse Practitioners) who all work together to provide you with the care you need, when you need it. You will need a follow up appointment in 4 months.  Please call our office 2 months in advance to schedule this appointment.  You may see Kathlyn Sacramento, MD or one of the following Advanced Practice Providers on your designated Care Team:   Murray Hodgkins, NP Christell Faith, PA-C . Marrianne Mood, PA-C  Any Other Special Instructions Will Be Listed Below (If Applicable).

## 2018-09-25 ENCOUNTER — Other Ambulatory Visit: Payer: Self-pay

## 2018-09-25 ENCOUNTER — Non-Acute Institutional Stay: Payer: Medicare Other | Admitting: Primary Care

## 2018-09-25 DIAGNOSIS — R5381 Other malaise: Secondary | ICD-10-CM

## 2018-09-25 DIAGNOSIS — Z515 Encounter for palliative care: Secondary | ICD-10-CM

## 2018-09-25 NOTE — Progress Notes (Signed)
[facility] Pall[facility] Consult Note Telephone: [phone]  Fax: [phone]  TELEHEALTH VISIT STATEMENT Due to the COVID-19 crisis, this visit was done via telemedicine from my office. It was initiated and consented to by this patient and/or family.  PATIENT NAME: Alan Marshall DOB: 13-Jul-1935 MRN: [id number]  PRIMARY CARE PROVIDER:   [facility]  Dr Briant Sites- [facility] [place] [facility] [place], [address] [place], [place] [address]  RESPONSIBLE PARTY:  Self and    Extended Emergency Contact Information Primary Emergency Contact: Lungren,Nicole Mobile Phone: [phone] Relation: Daughter Secondary Emergency Contact: [place] Phone: [phone] Relation: Significant other   Interview per telemedicine with Alan Doffing, RN and patient at [facility]. Later f/u phone visit with daughter Alan Marshall.   ASSESSMENT and RECOMMENDATIONS :   1. Social wellbeing:  Needs phone to communicate with family. Staff states that  he has had several in the past and broken them. He is permitted to use SNF phone and often phones his family. Asked daughter during a later call  if she'Alan seen him by video, which she had not. She states a friend has brought phones but  they get lost/break. Alan Marshall (daughter)  that Alan Marshall was doing video chats by appointment, and she will call Alan Marshall to set up a visit, and also advise her sister to do so as well.   2. Podiatry: Please schedule podiatry as soon as possible for reported sore toe.  Please have him sign consent in order to obtain care. Patient states he wants to be seen ASAP and has not had podiatry f/u in quite some time.   3. 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 Please create referral for dental, audiology. Lost hearing aid and teeth. Teeth are reportedly replaced or in the process. Daughter states he had some in the past from  [place], but not sure if they're being re ordered.  4. Goals of Care: Patient has Full Code order current.  Will continue to discuss goals of care in setting of advanced chronic disease. Spoke with daughter Alan Marshall, [place],  by phone to inform her about the visit.   Palliative will continue to follow for goals of care clarification, symptom management. Return 4-6 weeks or prn.  I spent 25 minutes providing this consultation,  from 1330 to 1355. More than 50% of the time in this consultation was spent coordinating communication.   HISTORY OF PRESENT ILLNESS:  Alan Marshall is a 83 y.o. year old male with multiple medical problems including depression, CKD, HFrEF, HTN, anemia. Palliative Care was asked to help address goals of care.   CODE STATUS: FULL CODE  PPS: 40% HOSPICE ELIGIBILITY/DIAGNOSIS: TBD  PAST MEDICAL HISTORY:  Past Medical History:  Diagnosis Date  . Anemia   . CKD (chronic kidney disease), stage IV ([place])   . Depression   . GERD (gastroesophageal reflux disease)   . Glaucoma   . Gout   . HFrEF (heart failure with reduced ejection fraction) ([place])    a. 11/2017 Echo: EF <20, diff HK. mild to mod AI. Asc Ao 8mm (mod dil). Mod MR. Mod dil LA/RV/RA. PASP 74mmHg. Mod to large circumferential pericardial effusion w/o tamponade; b. 12/05/2017 Echo: EF 15-20%, mod AI, mild to mod MR, mod TR, PASP 58mmHg, mod pericardial effusion w/o tamponade.  . Hypertension   . Hypothyroidism   . NICM (nonischemic cardiomyopathy) ([place])    a.  S/p prior AICD->extracted in 2015 2/2 MSSA bacteremia; b. s/p Subcutaneous ICD; c. 11/2017 Echo: EF <20%; c. 12/05/2017 Echo: EF 15-20%.  . Pericardial effusion    a. 11/2017 Echo: Mod to large circumferential pericardial effusion w/o effusive-constrictive changes or tamponade physiology; b. 12/05/2017 Echo: EF 15-20%, mod pericardial effusion w/o hemodynamic compromise.  [name] Kitchen Permanent Atrial Fibrillation/Flutter ([place])    a. CHA2DS2VASc = 4-->No OAC 2/2 dementia.  .  Recurrent left pleural effusion    a. 11/2017 s/p thoracentesis.  Path neg.  . Vitamin Alan deficiency     SOCIAL HX:  Social History   Tobacco Use  . Smoking status: Never Smoker  . Smokeless tobacco: Never Used  Substance Use Topics  . Alcohol use: Not Currently    ALLERGIES: No Known Allergies   PERTINENT MEDICATIONS:  Outpatient Encounter Medications as of 09/25/2018  Medication Sig  . acetaminophen (TYLENOL) 325 MG tablet Take 975 mg by mouth every 8 (eight) hours as needed for mild pain, moderate pain or fever.   [name] Kitchen allopurinol (ZYLOPRIM) 100 MG tablet Take 0.5 tablets (50 mg total) by mouth daily. (Patient taking differently: Take 50 mg by mouth daily with supper. )  . amiodarone (PACERONE) 100 MG tablet Take 100 mg by mouth daily.  . bisacodyl (DULCOLAX) 10 MG suppository Place 10 mg rectally daily as needed for moderate constipation.  . carvedilol (COREG) 3.125 MG tablet Take 1 tablet (3.125 mg total) by mouth 2 (two) times daily with a meal.  . citalopram (CELEXA) 10 MG tablet Take 10 mg by mouth daily.  . dicloxacillin (DYNAPEN) 500 MG capsule Take 500 mg by mouth every 8 (eight) hours.  . dorzolamide (TRUSOPT) 2 % ophthalmic solution Place 1 drop into both eyes 2 (two) times daily.  [name] Kitchen ipratropium-albuterol (DUONEB) 0.5-2.5 (3) MG/3ML SOLN Take 3 mLs by nebulization every 6 (six) hours as needed (for shortness of breath).   . latanoprost (XALATAN) 0.005 % ophthalmic solution Place 1 drop into both eyes at bedtime.  [name] Kitchen levothyroxine (SYNTHROID, LEVOTHROID) 112 MCG tablet Take 112 mcg by mouth daily before breakfast.  . loratadine (CLARITIN) 10 MG tablet Take 10 mg by mouth daily.  . Melatonin 3 MG TABS Take 3 mg by mouth at bedtime.   . Menthol-Zinc Oxide 0.44-20.625 % OINT Apply topically 2 (two) times daily. To scrotum  . metolazone (ZAROXOLYN) 2.5 MG tablet Take 1 tablet (2.5 mg total) by mouth as needed. For a weight gain of 3 pounds overnight or 5 pounds in one week (Patient  taking differently: Take 2.5 mg by mouth daily as needed (for weight gain). )  . polyethylene glycol powder (GAVILAX) powder Take 17 g by mouth daily.  . potassium chloride SA (K-DUR,KLOR-CON) 20 MEQ tablet Take 20 mEq by mouth daily.  . sennosides-docusate sodium (SENOKOT-S) 8.6-50 MG tablet Take 1 tablet by mouth 2 (two) times daily.  . simethicone (MYLICON) 80 MG chewable tablet Chew 80 mg by mouth every 6 (six) hours as needed for flatulence.  . Skin Protectants, Misc. (EUCERIN) cream Apply 1 application topically 2 (two) times daily.  [name] Kitchen torsemide (DEMADEX) 20 MG tablet Take 2 tablets (40 mg total) by mouth 2 (two) times daily.   No facility-administered encounter medications on file as of 09/25/2018.     PHYSICAL EXAM:   197.8 lb, wt has been stable.   General: NAD, WNWD appearing, eating and sleeping well Cardiovascular: denies chest pain Pulmonary: no cough, no SOB. Abdomen: appetite good, denies constipation GU: denies dysuria Extremities:  no LE edema, w/c bound.  Skin: no rashes or wounds reported. Neurological: Weakness, forgetful at times, agitated at times, cooperative today in interview.  Cyndia Skeeters DNP, AGPCNP-BC

## 2018-10-21 ENCOUNTER — Ambulatory Visit: Payer: Medicare Other | Admitting: Family

## 2018-11-11 ENCOUNTER — Non-Acute Institutional Stay: Payer: Medicare Other | Admitting: Primary Care

## 2018-11-11 ENCOUNTER — Other Ambulatory Visit: Payer: Self-pay

## 2018-11-11 DIAGNOSIS — Z515 Encounter for palliative care: Secondary | ICD-10-CM

## 2018-11-11 NOTE — Progress Notes (Signed)
[facility] Consult Note Telephone: [phone]  Fax: [phone]  TELEHEALTH VISIT STATEMENT Due to the COVID-19 crisis, this visit was done via telemedicine from my office. It was initiated and consented to by this patient and/or family.  PATIENT NAME: Alan Marshall DOB: 1935/10/17 MRN: [id number]  PRIMARY CARE PROVIDER:   Alvester Morin, MD  REFERRING PROVIDER:  Alvester Morin, MD [address] [place], [place] [address]  RESPONSIBLE PARTY:   Extended Emergency Contact Information Primary Emergency Contact: Schorsch,Nicole Mobile Phone: [phone] Relation: Daughter Secondary Emergency Contact: Bryant,Gilda Home Phone: [phone] Relation: Significant other  Palliative Care was asked to follow patient by consultation request of Alvester Morin, MD. This is the follow up visit.  ASSESSMENT AND RECOMMENDATIONS:   1. Goals of Care: Voices desire for full scope of care.   2. Symptom Management:  No pain, agitation better per SNF staff. Review need for anxiolytic.  3. Family Supports:   Continue phone calls from his family, Has family on [place]. They call daily. Patient states this is an encouragement to him.  4. Cognitive / Functional decline: Able to make own decisions. States he feels well and is in a positive mood.  5. Advanced Care Directive: Full code per his wishes. Will create MOST when able to visit in person. No smart device available.Patient is a English as a second language teacher.  6. Follow up Palliative Care Visit: Palliative care will continue to follow for goals of care clarification and symptom management. Return 6-8 weeks or prn.  I spent 25 minutes providing this consultation,  from 1130 to 1155. More than 50% of the time in this consultation was spent coordinating communication.   HISTORY OF PRESENT ILLNESS:  Alan Marshall is a 83 y.o. year old male with multiple medical problems including depression,  CKD, HFrEF, HTN, anemia. . Palliative Care was asked to help address goals of care.   CODE STATUS: FULL  PPS: 40% HOSPICE ELIGIBILITY/DIAGNOSIS: TBD  PAST MEDICAL HISTORY:  Past Medical History:  Diagnosis Date  . Anemia   . CKD (chronic kidney disease), stage IV ([place])   . Depression   . GERD (gastroesophageal reflux disease)   . Glaucoma   . Gout   . HFrEF (heart failure with reduced ejection fraction) ([place])    a. 11/2017 Echo: EF <20, diff HK. mild to mod AI. Asc Ao 17mm (mod dil). Mod MR. Mod dil LA/RV/RA. PASP 29mmHg. Mod to large circumferential pericardial effusion w/o tamponade; b. 12/05/2017 Echo: EF 15-20%, mod AI, mild to mod MR, mod TR, PASP 33mmHg, mod pericardial effusion w/o tamponade.  . Hypertension   . Hypothyroidism   . NICM (nonischemic cardiomyopathy) ([place])    a. S/p prior AICD->extracted in 2015 2/2 MSSA bacteremia; b. s/p Subcutaneous ICD; c. 11/2017 Echo: EF <20%; c. 12/05/2017 Echo: EF 15-20%.  . Pericardial effusion    a. 11/2017 Echo: Mod to large circumferential pericardial effusion w/o effusive-constrictive changes or tamponade physiology; b. 12/05/2017 Echo: EF 15-20%, mod pericardial effusion w/o hemodynamic compromise.  [name] Kitchen Permanent Atrial Fibrillation/Flutter ([place])    a. CHA2DS2VASc = 4-->No OAC 2/2 dementia.  . Recurrent left pleural effusion    a. 11/2017 s/p thoracentesis.  Path neg.  . Vitamin D deficiency     SOCIAL HX:  Social History   Tobacco Use  . Smoking status: Never Smoker  . Smokeless tobacco: Never Used  Substance Use Topics  . Alcohol use: Not Currently    ALLERGIES: No Known Allergies   PERTINENT MEDICATIONS:  Outpatient Encounter Medications as of 11/11/2018  Medication Sig  . acetaminophen (TYLENOL) 325 MG tablet Take 975 mg by mouth every 8 (eight) hours as needed for mild pain, moderate pain or fever.   [name] Kitchen allopurinol (ZYLOPRIM) 100 MG tablet Take 0.5 tablets (50 mg total) by mouth daily. (Patient taking differently: Take 50 mg by  mouth daily with supper. )  . amiodarone (PACERONE) 100 MG tablet Take 100 mg by mouth daily.  . bisacodyl (DULCOLAX) 10 MG suppository Place 10 mg rectally daily as needed for moderate constipation.  . carvedilol (COREG) 3.125 MG tablet Take 1 tablet (3.125 mg total) by mouth 2 (two) times daily with a meal.  . citalopram (CELEXA) 10 MG tablet Take 10 mg by mouth daily.  . dicloxacillin (DYNAPEN) 500 MG capsule Take 500 mg by mouth every 8 (eight) hours.  . dorzolamide (TRUSOPT) 2 % ophthalmic solution Place 1 drop into both eyes 2 (two) times daily.  [name] Kitchen ipratropium-albuterol (DUONEB) 0.5-2.5 (3) MG/3ML SOLN Take 3 mLs by nebulization every 6 (six) hours as needed (for shortness of breath).   . latanoprost (XALATAN) 0.005 % ophthalmic solution Place 1 drop into both eyes at bedtime.  [name] Kitchen levothyroxine (SYNTHROID, LEVOTHROID) 112 MCG tablet Take 112 mcg by mouth daily before breakfast.  . loratadine (CLARITIN) 10 MG tablet Take 10 mg by mouth daily.  . Melatonin 3 MG TABS Take 3 mg by mouth at bedtime.   . Menthol-Zinc Oxide 0.44-20.625 % OINT Apply topically 2 (two) times daily. To scrotum  . metolazone (ZAROXOLYN) 2.5 MG tablet Take 1 tablet (2.5 mg total) by mouth as needed. For a weight gain of 3 pounds overnight or 5 pounds in one week (Patient taking differently: Take 2.5 mg by mouth daily as needed (for weight gain). )  . polyethylene glycol powder (GAVILAX) powder Take 17 g by mouth daily.  . potassium chloride SA (K-DUR,KLOR-CON) 20 MEQ tablet Take 20 mEq by mouth daily.  . sennosides-docusate sodium (SENOKOT-S) 8.6-50 MG tablet Take 1 tablet by mouth 2 (two) times daily.  . simethicone (MYLICON) 80 MG chewable tablet Chew 80 mg by mouth every 6 (six) hours as needed for flatulence.  . Skin Protectants, Misc. (EUCERIN) cream Apply 1 application topically 2 (two) times daily.  [name] Kitchen torsemide (DEMADEX) 20 MG tablet Take 2 tablets (40 mg total) by mouth 2 (two) times daily.   No  facility-administered encounter medications on file as of 11/11/2018.     PHYSICAL EXAM/ROS:   Current and past weights: 10/12/2018= 200 lbs.  General: NAD, frail appearing, WNWD Cardiovascular: no chest pain reported, no edema noted Pulmonary: no cough, no increased SOB, room air, recovered from covid and cleared. Abdomen: appetite good, denies constipation, incontinent GU: denies dysuria, incontinent MSK:  Cannot bear weight, OOB with lift Skin: has LE wraps, SNF NP to assess Neurological: Weakness, has decision capacity, but otherwise nonfocal, no agitation reported of late  [place], AGPCNP-BC

## 2018-12-23 ENCOUNTER — Non-Acute Institutional Stay: Payer: Medicare Other | Admitting: Primary Care

## 2018-12-23 ENCOUNTER — Other Ambulatory Visit: Payer: Self-pay

## 2018-12-23 DIAGNOSIS — Z515 Encounter for palliative care: Secondary | ICD-10-CM

## 2018-12-23 NOTE — Progress Notes (Signed)
[facility] [facility] Palliative Care Consult Note Telephone: [phone]  Fax: [phone]  TELEHEALTH VISIT STATEMENT Due to the COVID-19 crisis, this visit was done via telemedicine from my office. It was initiated and consented to by this patient and/or family.  PATIENT NAME: Alan Marshall DOB: [birth year] MRN: [id number]  PRIMARY CARE PROVIDER:   Alvester Morin, [place]  REFERRING PROVIDER:  Alvester Morin, MD [address] [place],  [place] [address] [phone]  RESPONSIBLE PARTY:   Extended Emergency Contact Information Primary Emergency Contact: Gillingham,Nicole Mobile Phone: [phone] Relation: Daughter Secondary Emergency Contact: Bryant,Gilda Home Phone: [phone] Relation: Significant other  Palliative Care was asked to follow this patient by consultation request of Alvester Morin, MD. This is a follow up visit.  ASSESSMENT AND RECOMMENDATIONS:    1. Goals of Care: Maximize quality of life and symptomanagement.  2. Symptom Management:     Pain in Leg: Recommend narcotic asap for increasing pain, oxycodone 5 mg po q 4 hrs prn. Recommend scheduling surgery consultation for amputation States he was recommended to have amputation he is now ready due to the pain. He is not taking any narcotics and reports 8-10/10 pain.   3. Family Supports/caregiver:   Continues to receive phone calls from his family daily. They live in [place]. Resident of LTC facility.   4. Cognitive / Functional decline: Able to make own decisions and states he wants to elect amputation. Pain is interfering with adls.   5. Advanced Care Directive: Full code per his wishes. Will create MOST when able to visit in person. Patient is a English as a second language teacher.   6. Follow up Palliative Care Visit: Palliative care will continue to follow for goals of care clarification and symptom management. Return 4-6 weeks or prn.   I spent 25 minutes providing  this consultation, from 1025 to 1055. More than 50% of the time in this consultation was spent coordinating communication.    HISTORY OF PRESENT ILLNESS:  Alan Marshall is a 83 y.o. year old male with multiple medical problems including depression, CKD, HFrEF, HTN, anemia. Palliative Care was asked to help address goals of care.    CODE STATUS: FULL   PPS: 40% HOSPICE ELIGIBILITY/DIAGNOSIS: no   PAST MEDICAL HISTORY:  Past Medical History:  Diagnosis Date  . Anemia   . CKD (chronic kidney disease), stage IV ([place])   . Depression   . GERD (gastroesophageal reflux disease)   . Glaucoma   . Gout   . HFrEF (heart failure with reduced ejection fraction) ([place])    a. 11/2017 Echo: EF <20, diff HK. mild to mod AI. Asc Ao 54mm (mod dil). Mod MR. Mod dil LA/RV/RA. PASP 77mmHg. Mod to large circumferential pericardial effusion w/o tamponade; b. 12/05/2017 Echo: EF 15-20%, mod AI, mild to mod MR, mod TR, PASP 56mmHg, mod pericardial effusion w/o tamponade.  . Hypertension   . Hypothyroidism   . NICM (nonischemic cardiomyopathy) ([place])    a. S/p prior AICD->extracted in 2015 2/2 MSSA bacteremia; b. s/p Subcutaneous ICD; c. 11/2017 Echo: EF <20%; c. 12/05/2017 Echo: EF 15-20%.  . Pericardial effusion    a. 11/2017 Echo: Mod to large circumferential pericardial effusion w/o effusive-constrictive changes or tamponade physiology; b. 12/05/2017 Echo: EF 15-20%, mod pericardial effusion w/o hemodynamic compromise.  [name] Kitchen Permanent Atrial Fibrillation/Flutter ([place])    a. CHA2DS2VASc = 4-->No OAC 2/2 dementia.  . Recurrent left pleural effusion    a. 11/2017 s/p thoracentesis.  Path neg.  . Vitamin D deficiency  SOCIAL HX:  Social History   Tobacco Use  . Smoking status: Never Smoker  . Smokeless tobacco: Never Used  Substance Use Topics  . Alcohol use: Not Currently    ALLERGIES: No Known Allergies   PERTINENT MEDICATIONS:  Outpatient Encounter Medications as of 12/23/2018  Medication Sig  .  acetaminophen (TYLENOL) 325 MG tablet Take 975 mg by mouth every 8 (eight) hours as needed for mild pain, moderate pain or fever.   [name] Kitchen allopurinol (ZYLOPRIM) 100 MG tablet Take 0.5 tablets (50 mg total) by mouth daily. (Patient taking differently: Take 50 mg by mouth daily with supper. )  . amiodarone (PACERONE) 100 MG tablet Take 100 mg by mouth daily.  . bisacodyl (DULCOLAX) 10 MG suppository Place 10 mg rectally daily as needed for moderate constipation.  . carvedilol (COREG) 3.125 MG tablet Take 1 tablet (3.125 mg total) by mouth 2 (two) times daily with a meal.  . citalopram (CELEXA) 10 MG tablet Take 10 mg by mouth daily.  . dicloxacillin (DYNAPEN) 500 MG capsule Take 500 mg by mouth every 8 (eight) hours.  . dorzolamide (TRUSOPT) 2 % ophthalmic solution Place 1 drop into both eyes 2 (two) times daily.  [name] Kitchen ipratropium-albuterol (DUONEB) 0.5-2.5 (3) MG/3ML SOLN Take 3 mLs by nebulization every 6 (six) hours as needed (for shortness of breath).   . latanoprost (XALATAN) 0.005 % ophthalmic solution Place 1 drop into both eyes at bedtime.  [name] Kitchen levothyroxine (SYNTHROID, LEVOTHROID) 112 MCG tablet Take 112 mcg by mouth daily before breakfast.  . loratadine (CLARITIN) 10 MG tablet Take 10 mg by mouth daily.  . Melatonin 3 MG TABS Take 3 mg by mouth at bedtime.   . Menthol-Zinc Oxide 0.44-20.625 % OINT Apply topically 2 (two) times daily. To scrotum  . metolazone (ZAROXOLYN) 2.5 MG tablet Take 1 tablet (2.5 mg total) by mouth as needed. For a weight gain of 3 pounds overnight or 5 pounds in one week (Patient taking differently: Take 2.5 mg by mouth daily as needed (for weight gain). )  . polyethylene glycol powder (GAVILAX) powder Take 17 g by mouth daily.  . potassium chloride SA (K-DUR,KLOR-CON) 20 MEQ tablet Take 20 mEq by mouth daily.  . sennosides-docusate sodium (SENOKOT-S) 8.6-50 MG tablet Take 1 tablet by mouth 2 (two) times daily.  . simethicone (MYLICON) 80 MG chewable tablet Chew 80 mg by  mouth every 6 (six) hours as needed for flatulence.  . Skin Protectants, Misc. (EUCERIN) cream Apply 1 application topically 2 (two) times daily.  [name] Kitchen torsemide (DEMADEX) 20 MG tablet Take 2 tablets (40 mg total) by mouth 2 (two) times daily.   No facility-administered encounter medications on file as of 12/23/2018.     PHYSICAL EXAM/ROS:    100/62  77-18  98.0  97% ra  158 wt. Today reported Current and past weights: 10/12/2018= 200 lbs.  This should be verified General: NAD, frail appearing, WNWD, pain in r leg worst 8/10, constant ache Cardiovascular: no chest pain reported, no edema noted Pulmonary: no cough, no increased SOB, room air, S/p covid infection Abdomen: appetite good at breakfast, denies constipation, incontinent GU: denies dysuria, incontinent MSK:  Cannot bear weight, OOB with lift. States leg pain and he is ready for amputation.  Neurological: Weakness, has decision capacity,  no agitation reported, enjoys sleeping , chronic LE pain.   Cyndia Skeeters DNP, AGPCNP-BC

## 2018-12-29 ENCOUNTER — Other Ambulatory Visit (INDEPENDENT_AMBULATORY_CARE_PROVIDER_SITE_OTHER): Payer: Self-pay | Admitting: Vascular Surgery

## 2018-12-29 ENCOUNTER — Other Ambulatory Visit: Payer: Self-pay

## 2018-12-29 ENCOUNTER — Emergency Department: Payer: Medicare Other

## 2018-12-29 ENCOUNTER — Inpatient Hospital Stay
Admission: EM | Admit: 2018-12-29 | Discharge: 2019-01-09 | DRG: 871 | Disposition: E | Payer: Medicare Other | Source: Skilled Nursing Facility | Attending: Internal Medicine | Admitting: Internal Medicine

## 2018-12-29 DIAGNOSIS — L89159 Pressure ulcer of sacral region, unspecified stage: Secondary | ICD-10-CM | POA: Diagnosis not present

## 2018-12-29 DIAGNOSIS — Z20828 Contact with and (suspected) exposure to other viral communicable diseases: Secondary | ICD-10-CM | POA: Diagnosis present

## 2018-12-29 DIAGNOSIS — L89210 Pressure ulcer of right hip, unstageable: Secondary | ICD-10-CM | POA: Diagnosis present

## 2018-12-29 DIAGNOSIS — R52 Pain, unspecified: Secondary | ICD-10-CM | POA: Diagnosis not present

## 2018-12-29 DIAGNOSIS — L97229 Non-pressure chronic ulcer of left calf with unspecified severity: Secondary | ICD-10-CM | POA: Diagnosis not present

## 2018-12-29 DIAGNOSIS — G9341 Metabolic encephalopathy: Secondary | ICD-10-CM | POA: Diagnosis present

## 2018-12-29 DIAGNOSIS — A419 Sepsis, unspecified organism: Secondary | ICD-10-CM | POA: Diagnosis present

## 2018-12-29 DIAGNOSIS — Z515 Encounter for palliative care: Secondary | ICD-10-CM | POA: Diagnosis not present

## 2018-12-29 DIAGNOSIS — N184 Chronic kidney disease, stage 4 (severe): Secondary | ICD-10-CM | POA: Diagnosis present

## 2018-12-29 DIAGNOSIS — L89150 Pressure ulcer of sacral region, unstageable: Secondary | ICD-10-CM | POA: Diagnosis present

## 2018-12-29 DIAGNOSIS — I70249 Atherosclerosis of native arteries of left leg with ulceration of unspecified site: Secondary | ICD-10-CM | POA: Diagnosis not present

## 2018-12-29 DIAGNOSIS — L97929 Non-pressure chronic ulcer of unspecified part of left lower leg with unspecified severity: Secondary | ICD-10-CM | POA: Diagnosis present

## 2018-12-29 DIAGNOSIS — Z7189 Other specified counseling: Secondary | ICD-10-CM | POA: Diagnosis not present

## 2018-12-29 DIAGNOSIS — E785 Hyperlipidemia, unspecified: Secondary | ICD-10-CM | POA: Diagnosis present

## 2018-12-29 DIAGNOSIS — Z95810 Presence of automatic (implantable) cardiac defibrillator: Secondary | ICD-10-CM

## 2018-12-29 DIAGNOSIS — I420 Dilated cardiomyopathy: Secondary | ICD-10-CM | POA: Diagnosis present

## 2018-12-29 DIAGNOSIS — I4821 Permanent atrial fibrillation: Secondary | ICD-10-CM | POA: Diagnosis present

## 2018-12-29 DIAGNOSIS — L97319 Non-pressure chronic ulcer of right ankle with unspecified severity: Secondary | ICD-10-CM | POA: Diagnosis not present

## 2018-12-29 DIAGNOSIS — I959 Hypotension, unspecified: Secondary | ICD-10-CM | POA: Diagnosis not present

## 2018-12-29 DIAGNOSIS — L89899 Pressure ulcer of other site, unspecified stage: Secondary | ICD-10-CM | POA: Diagnosis not present

## 2018-12-29 DIAGNOSIS — M24561 Contracture, right knee: Secondary | ICD-10-CM | POA: Diagnosis present

## 2018-12-29 DIAGNOSIS — Z681 Body mass index (BMI) 19 or less, adult: Secondary | ICD-10-CM

## 2018-12-29 DIAGNOSIS — J9690 Respiratory failure, unspecified, unspecified whether with hypoxia or hypercapnia: Secondary | ICD-10-CM

## 2018-12-29 DIAGNOSIS — I96 Gangrene, not elsewhere classified: Secondary | ICD-10-CM | POA: Diagnosis present

## 2018-12-29 DIAGNOSIS — I998 Other disorder of circulatory system: Secondary | ICD-10-CM | POA: Diagnosis present

## 2018-12-29 DIAGNOSIS — Z66 Do not resuscitate: Secondary | ICD-10-CM | POA: Diagnosis not present

## 2018-12-29 DIAGNOSIS — M109 Gout, unspecified: Secondary | ICD-10-CM | POA: Diagnosis present

## 2018-12-29 DIAGNOSIS — N179 Acute kidney failure, unspecified: Secondary | ICD-10-CM | POA: Diagnosis present

## 2018-12-29 DIAGNOSIS — E43 Unspecified severe protein-calorie malnutrition: Secondary | ICD-10-CM | POA: Diagnosis present

## 2018-12-29 DIAGNOSIS — E039 Hypothyroidism, unspecified: Secondary | ICD-10-CM | POA: Diagnosis present

## 2018-12-29 DIAGNOSIS — L97818 Non-pressure chronic ulcer of other part of right lower leg with other specified severity: Secondary | ICD-10-CM | POA: Diagnosis present

## 2018-12-29 DIAGNOSIS — E875 Hyperkalemia: Secondary | ICD-10-CM | POA: Diagnosis present

## 2018-12-29 DIAGNOSIS — N19 Unspecified kidney failure: Secondary | ICD-10-CM

## 2018-12-29 DIAGNOSIS — I5023 Acute on chronic systolic (congestive) heart failure: Secondary | ICD-10-CM | POA: Diagnosis present

## 2018-12-29 DIAGNOSIS — Z8249 Family history of ischemic heart disease and other diseases of the circulatory system: Secondary | ICD-10-CM

## 2018-12-29 DIAGNOSIS — I509 Heart failure, unspecified: Secondary | ICD-10-CM | POA: Diagnosis not present

## 2018-12-29 DIAGNOSIS — R6520 Severe sepsis without septic shock: Secondary | ICD-10-CM | POA: Diagnosis not present

## 2018-12-29 DIAGNOSIS — R6521 Severe sepsis with septic shock: Secondary | ICD-10-CM | POA: Diagnosis present

## 2018-12-29 DIAGNOSIS — L0390 Cellulitis, unspecified: Secondary | ICD-10-CM

## 2018-12-29 DIAGNOSIS — I739 Peripheral vascular disease, unspecified: Secondary | ICD-10-CM | POA: Diagnosis present

## 2018-12-29 DIAGNOSIS — L8990 Pressure ulcer of unspecified site, unspecified stage: Secondary | ICD-10-CM | POA: Insufficient documentation

## 2018-12-29 DIAGNOSIS — I130 Hypertensive heart and chronic kidney disease with heart failure and stage 1 through stage 4 chronic kidney disease, or unspecified chronic kidney disease: Secondary | ICD-10-CM | POA: Diagnosis present

## 2018-12-29 DIAGNOSIS — Z79890 Hormone replacement therapy (postmenopausal): Secondary | ICD-10-CM

## 2018-12-29 DIAGNOSIS — H409 Unspecified glaucoma: Secondary | ICD-10-CM | POA: Diagnosis present

## 2018-12-29 DIAGNOSIS — I70239 Atherosclerosis of native arteries of right leg with ulceration of unspecified site: Secondary | ICD-10-CM | POA: Diagnosis not present

## 2018-12-29 LAB — URINALYSIS, ROUTINE W REFLEX MICROSCOPIC
Bilirubin Urine: NEGATIVE
Glucose, UA: NEGATIVE mg/dL
Ketones, ur: NEGATIVE mg/dL
Nitrite: NEGATIVE
Protein, ur: NEGATIVE mg/dL
Specific Gravity, Urine: 1.011 (ref 1.005–1.030)
WBC, UA: >50 WBC/hpf — ABNORMAL HIGH (ref 0–5)
pH: 5 (ref 5.0–8.0)

## 2018-12-29 LAB — BASIC METABOLIC PANEL
Anion gap: 11 (ref 5–15)
Anion gap: 11 (ref 5–15)
BUN: 128 mg/dL — ABNORMAL HIGH (ref 8–23)
BUN: 133 mg/dL — ABNORMAL HIGH (ref 8–23)
CO2: 12 mmol/L — ABNORMAL LOW (ref 22–32)
CO2: 8 mmol/L — ABNORMAL LOW (ref 22–32)
Calcium: 8.5 mg/dL — ABNORMAL LOW (ref 8.9–10.3)
Calcium: 8.1 mg/dL — ABNORMAL LOW (ref 8.9–10.3)
Chloride: 122 mmol/L — ABNORMAL HIGH (ref 98–111)
Chloride: 128 mmol/L — ABNORMAL HIGH (ref 98–111)
Creatinine, Ser: 3.87 mg/dL — ABNORMAL HIGH (ref 0.61–1.24)
Creatinine, Ser: 3.9 mg/dL — ABNORMAL HIGH (ref 0.61–1.24)
GFR calc Af Amer: 16 mL/min — ABNORMAL LOW (ref >60)
GFR calc Af Amer: 16 mL/min — ABNORMAL LOW (ref >60)
GFR calc non Af Amer: 14 mL/min — ABNORMAL LOW (ref >60)
GFR calc non Af Amer: 14 mL/min — ABNORMAL LOW (ref >60)
Glucose, Bld: 107 mg/dL — ABNORMAL HIGH (ref 70–99)
Glucose, Bld: 84 mg/dL (ref 70–99)
Potassium: 6.6 mmol/L (ref 3.5–5.1)
Potassium: 6.6 mmol/L (ref 3.5–5.1)
Sodium: 145 mmol/L (ref 135–145)
Sodium: 147 mmol/L — ABNORMAL HIGH (ref 135–145)

## 2018-12-29 LAB — COMPREHENSIVE METABOLIC PANEL
ALT: 12 U/L (ref 0–44)
AST: 24 U/L (ref 15–41)
Albumin: 2.2 g/dL — ABNORMAL LOW (ref 3.5–5.0)
Alkaline Phosphatase: 65 U/L (ref 38–126)
Anion gap: 9 (ref 5–15)
BUN: 134 mg/dL — ABNORMAL HIGH (ref 8–23)
CO2: 11 mmol/L — ABNORMAL LOW (ref 22–32)
Calcium: 7.9 mg/dL — ABNORMAL LOW (ref 8.9–10.3)
Chloride: 125 mmol/L — ABNORMAL HIGH (ref 98–111)
Creatinine, Ser: 3.96 mg/dL — ABNORMAL HIGH (ref 0.61–1.24)
GFR calc Af Amer: 16 mL/min — ABNORMAL LOW (ref >60)
GFR calc non Af Amer: 13 mL/min — ABNORMAL LOW (ref >60)
Glucose, Bld: 85 mg/dL (ref 70–99)
Potassium: 6.6 mmol/L (ref 3.5–5.1)
Sodium: 145 mmol/L (ref 135–145)
Total Bilirubin: 0.9 mg/dL (ref 0.3–1.2)
Total Protein: 6.3 g/dL — ABNORMAL LOW (ref 6.5–8.1)

## 2018-12-29 LAB — APTT: aPTT: 36 seconds (ref 24–36)

## 2018-12-29 LAB — LACTIC ACID, PLASMA: Lactic Acid, Venous: 1.1 mmol/L (ref 0.5–1.9)

## 2018-12-29 LAB — CBC WITH DIFFERENTIAL/PLATELET
Abs Immature Granulocytes: 0.07 10*3/uL (ref 0.00–0.07)
Basophils Absolute: 0.1 10*3/uL (ref 0.0–0.1)
Basophils Relative: 0 %
Eosinophils Absolute: 0.2 10*3/uL (ref 0.0–0.5)
Eosinophils Relative: 2 %
HCT: 30.9 % — ABNORMAL LOW (ref 39.0–52.0)
Hemoglobin: 9.9 g/dL — ABNORMAL LOW (ref 13.0–17.0)
Immature Granulocytes: 1 %
Lymphocytes Relative: 6 %
Lymphs Abs: 0.6 10*3/uL — ABNORMAL LOW (ref 0.7–4.0)
MCH: 28.9 pg (ref 26.0–34.0)
MCHC: 32 g/dL (ref 30.0–36.0)
MCV: 90.4 fL (ref 80.0–100.0)
Monocytes Absolute: 1.3 10*3/uL — ABNORMAL HIGH (ref 0.1–1.0)
Monocytes Relative: 12 %
Neutro Abs: 9.2 10*3/uL — ABNORMAL HIGH (ref 1.7–7.7)
Neutrophils Relative %: 79 %
Platelets: 208 10*3/uL (ref 150–400)
RBC: 3.42 MIL/uL — ABNORMAL LOW (ref 4.22–5.81)
RDW: 18 % — ABNORMAL HIGH (ref 11.5–15.5)
WBC: 11.4 10*3/uL — ABNORMAL HIGH (ref 4.0–10.5)
nRBC: 0 % (ref 0.0–0.2)

## 2018-12-29 LAB — MRSA PCR SCREENING: MRSA by PCR: NEGATIVE

## 2018-12-29 LAB — TROPONIN I (HIGH SENSITIVITY): Troponin I (High Sensitivity): 117 ng/L (ref <18)

## 2018-12-29 LAB — MAGNESIUM: Magnesium: 2.5 mg/dL — ABNORMAL HIGH (ref 1.7–2.4)

## 2018-12-29 LAB — PROTIME-INR
INR: 1.4 — ABNORMAL HIGH (ref 0.8–1.2)
Prothrombin Time: 17.1 seconds — ABNORMAL HIGH (ref 11.4–15.2)

## 2018-12-29 LAB — CREATININE, SERUM
Creatinine, Ser: 3.75 mg/dL — ABNORMAL HIGH (ref 0.61–1.24)
GFR calc Af Amer: 17 mL/min — ABNORMAL LOW (ref >60)
GFR calc non Af Amer: 14 mL/min — ABNORMAL LOW (ref >60)

## 2018-12-29 LAB — GLUCOSE, CAPILLARY
Glucose-Capillary: 85 mg/dL (ref 70–99)
Glucose-Capillary: 72 mg/dL (ref 70–99)

## 2018-12-29 LAB — GLUCOSE, RANDOM: Glucose, Bld: 91 mg/dL (ref 70–99)

## 2018-12-29 LAB — SARS CORONAVIRUS 2 BY RT PCR (HOSPITAL ORDER, PERFORMED IN ~~LOC~~ HOSPITAL LAB): SARS Coronavirus 2: NEGATIVE

## 2018-12-29 MED ORDER — SODIUM CHLORIDE 0.9 % IV BOLUS
1000.0000 mL | Freq: Once | INTRAVENOUS | Status: AC
  Administered 2018-12-29: 10:00:00 1000 mL via INTRAVENOUS

## 2018-12-29 MED ORDER — FERROUS SULFATE 325 (65 FE) MG PO TABS: 325.0000 mg | ORAL_TABLET | Freq: Every day | ORAL | Status: DC

## 2018-12-29 MED ORDER — HYDROCERIN EX CREA
1.0000 "application " | TOPICAL_CREAM | Freq: Two times a day (BID) | CUTANEOUS | Status: DC
  Administered 2018-12-29 – 2019-01-01 (×6): 1 via TOPICAL
  Filled 2018-12-29: qty 113

## 2018-12-29 MED ORDER — POLYETHYLENE GLYCOL 3350 17 G PO PACK: 17.0000 g | PACK | Freq: Every day | ORAL | Status: DC

## 2018-12-29 MED ORDER — LACTATED RINGERS IV SOLN
INTRAVENOUS | Status: DC
  Administered 2018-12-29: 23:00:00 via INTRAVENOUS

## 2018-12-29 MED ORDER — DEXTROSE 50 % IV SOLN
1.0000 | Freq: Once | INTRAVENOUS | Status: AC
  Administered 2018-12-29: 50 mL via INTRAVENOUS
  Filled 2018-12-29: qty 50

## 2018-12-29 MED ORDER — VANCOMYCIN HCL 500 MG IV SOLR: 500.0000 mg | INTRAVENOUS | Status: DC

## 2018-12-29 MED ORDER — SODIUM CHLORIDE 0.9 % IV BOLUS
500.0000 mL | Freq: Once | INTRAVENOUS | Status: AC
  Administered 2018-12-29: 16:00:00 500 mL via INTRAVENOUS

## 2018-12-29 MED ORDER — SODIUM CHLORIDE 0.9 % IV SOLN
INTRAVENOUS | Status: DC
  Administered 2018-12-29: 15:00:00 via INTRAVENOUS

## 2018-12-29 MED ORDER — DORZOLAMIDE HCL 2 % OP SOLN
1.0000 [drp] | Freq: Two times a day (BID) | OPHTHALMIC | Status: DC
  Administered 2018-12-29 – 2019-01-01 (×5): 1 [drp] via OPHTHALMIC
  Filled 2018-12-29: qty 10

## 2018-12-29 MED ORDER — ACETAMINOPHEN 500 MG PO TABS
1000.0000 mg | ORAL_TABLET | Freq: Three times a day (TID) | ORAL | Status: DC
  Filled 2018-12-29: qty 2

## 2018-12-29 MED ORDER — MELATONIN 5 MG PO TABS
5.0000 mg | ORAL_TABLET | Freq: Every evening | ORAL | Status: DC | PRN
  Filled 2018-12-29: qty 1

## 2018-12-29 MED ORDER — METRONIDAZOLE IN NACL 5-0.79 MG/ML-% IV SOLN
500.0000 mg | Freq: Once | INTRAVENOUS | Status: AC
  Administered 2018-12-29: 10:00:00 500 mg via INTRAVENOUS
  Filled 2018-12-29: qty 100

## 2018-12-29 MED ORDER — VANCOMYCIN HCL IN DEXTROSE 1-5 GM/200ML-% IV SOLN
1000.0000 mg | Freq: Once | INTRAVENOUS | Status: AC
  Administered 2018-12-29: 11:00:00 1000 mg via INTRAVENOUS
  Filled 2018-12-29: qty 200

## 2018-12-29 MED ORDER — SODIUM ZIRCONIUM CYCLOSILICATE 10 G PO PACK
10.0000 g | PACK | Freq: Once | ORAL | Status: AC
  Administered 2018-12-29: 12:00:00 10 g via ORAL
  Filled 2018-12-29: qty 1

## 2018-12-29 MED ORDER — INSULIN ASPART 100 UNIT/ML IV SOLN
10.0000 [IU] | Freq: Once | INTRAVENOUS | Status: AC
  Administered 2018-12-29: 10 [IU] via INTRAVENOUS
  Filled 2018-12-29: qty 0.1

## 2018-12-29 MED ORDER — SENNOSIDES-DOCUSATE SODIUM 8.6-50 MG PO TABS: 1.0000 | ORAL_TABLET | Freq: Two times a day (BID) | ORAL | Status: DC

## 2018-12-29 MED ORDER — SODIUM CHLORIDE 0.9 % IV SOLN
2.0000 g | Freq: Once | INTRAVENOUS | Status: AC
  Administered 2018-12-29: 10:00:00 2 g via INTRAVENOUS
  Filled 2018-12-29: qty 2

## 2018-12-29 MED ORDER — OXYCODONE HCL 5 MG PO TABS
5.0000 mg | ORAL_TABLET | ORAL | Status: DC | PRN
  Filled 2018-12-29: qty 1

## 2018-12-29 MED ORDER — LEVOTHYROXINE SODIUM 112 MCG PO TABS
112.0000 ug | ORAL_TABLET | Freq: Every day | ORAL | Status: DC
  Filled 2018-12-29: qty 1

## 2018-12-29 MED ORDER — MORPHINE SULFATE (PF) 2 MG/ML IV SOLN
2.0000 mg | Freq: Once | INTRAVENOUS | Status: AC
  Administered 2018-12-29: 16:00:00 2 mg via INTRAVENOUS
  Filled 2018-12-29: qty 1

## 2018-12-29 MED ORDER — LATANOPROST 0.005 % OP SOLN
1.0000 [drp] | Freq: Every day | OPHTHALMIC | Status: DC
  Administered 2018-12-29 – 2018-12-31 (×3): 1 [drp] via OPHTHALMIC
  Filled 2018-12-29 (×2): qty 2.5

## 2018-12-29 MED ORDER — MORPHINE SULFATE (PF) 4 MG/ML IV SOLN
4.0000 mg | Freq: Once | INTRAVENOUS | Status: AC
  Administered 2018-12-29: 18:00:00 4 mg via INTRAVENOUS

## 2018-12-29 MED ORDER — SODIUM BICARBONATE 8.4 % IV SOLN
50.0000 meq | Freq: Once | INTRAVENOUS | Status: AC
  Administered 2018-12-29: 23:00:00 50 meq via INTRAVENOUS
  Filled 2018-12-29: qty 50

## 2018-12-29 MED ORDER — CITALOPRAM HYDROBROMIDE 20 MG PO TABS
20.0000 mg | ORAL_TABLET | Freq: Every day | ORAL | Status: DC
  Filled 2018-12-29: qty 1

## 2018-12-29 MED ORDER — MORPHINE SULFATE (PF) 4 MG/ML IV SOLN
INTRAVENOUS | Status: AC
  Administered 2018-12-29: 18:00:00 4 mg via INTRAVENOUS
  Filled 2018-12-29: qty 1

## 2018-12-29 MED ORDER — SODIUM CHLORIDE 0.9 % IV SOLN
2.0000 g | INTRAVENOUS | Status: DC
  Administered 2018-12-30 – 2019-01-01 (×3): 2 g via INTRAVENOUS
  Filled 2018-12-29 (×4): qty 2

## 2018-12-29 MED ORDER — AMIODARONE HCL 200 MG PO TABS
100.0000 mg | ORAL_TABLET | Freq: Every day | ORAL | Status: DC
  Filled 2018-12-29: qty 1

## 2018-12-29 MED ORDER — ZINC OXIDE 40 % EX OINT
1.0000 "application " | TOPICAL_OINTMENT | Freq: Two times a day (BID) | CUTANEOUS | Status: DC
  Administered 2018-12-29: 1 via TOPICAL
  Filled 2018-12-29: qty 113

## 2018-12-29 MED ORDER — NOREPINEPHRINE 4 MG/250ML-% IV SOLN
0.0000 ug/min | INTRAVENOUS | Status: DC
  Administered 2018-12-29: 20:00:00 2 ug/min via INTRAVENOUS
  Administered 2018-12-30: 12 ug/min via INTRAVENOUS
  Administered 2018-12-30: 06:00:00 7 ug/min via INTRAVENOUS
  Administered 2018-12-30: 21:00:00 18 ug/min via INTRAVENOUS
  Administered 2018-12-31: 22:00:00 12 ug/min via INTRAVENOUS
  Administered 2018-12-31: 16:00:00 10 ug/min via INTRAVENOUS
  Administered 2018-12-31: 11:00:00 17 ug/min via INTRAVENOUS
  Administered 2018-12-31: 06:00:00 16 ug/min via INTRAVENOUS
  Administered 2018-12-31: 02:00:00 15 ug/min via INTRAVENOUS
  Administered 2019-01-01: 15:00:00 10 ug/min via INTRAVENOUS
  Administered 2019-01-01 (×2): 13 ug/min via INTRAVENOUS
  Filled 2018-12-29 (×12): qty 250

## 2018-12-29 MED ORDER — LORATADINE 10 MG PO TABS
10.0000 mg | ORAL_TABLET | Freq: Every day | ORAL | Status: DC
  Filled 2018-12-29: qty 1

## 2018-12-29 MED ORDER — SIMETHICONE 80 MG PO CHEW
80.0000 mg | CHEWABLE_TABLET | Freq: Four times a day (QID) | ORAL | Status: DC | PRN
  Filled 2018-12-29: qty 1

## 2018-12-29 MED ORDER — MORPHINE SULFATE (PF) 2 MG/ML IV SOLN
1.0000 mg | INTRAVENOUS | Status: DC | PRN
  Administered 2018-12-29 – 2018-12-30 (×3): 2 mg via INTRAVENOUS
  Filled 2018-12-29 (×3): qty 1

## 2018-12-29 MED ORDER — ALLOPURINOL 100 MG PO TABS
50.0000 mg | ORAL_TABLET | Freq: Every day | ORAL | Status: DC
  Filled 2018-12-29: qty 0.5

## 2018-12-29 MED ORDER — OXYCODONE HCL 5 MG PO TABS: 5.0000 mg | ORAL_TABLET | ORAL | Status: DC | PRN

## 2018-12-29 NOTE — ED Notes (Signed)
Pt with low bp, see flowsheet, Alan Falco, NP notified by phone. See orders.

## 2018-12-29 NOTE — ED Notes (Signed)
Potassium 6.6- Bill, RN and Dr Kerman Passey aware

## 2018-12-29 NOTE — Progress Notes (Signed)
PHARMACY -  BRIEF ANTIBIOTIC NOTE   Pharmacy has received consult(s) for Vancomycin and Cefepime from an ED provider.  The patient's profile has been reviewed for ht/wt/allergies/indication/available labs.    One time order(s) placed for Vancomycin and Cefepime by ED provider.  Further antibiotics/pharmacy consults should be ordered by admitting physician if indicated.                       Thank you, Vira Blanco 12/16/2018  12:12 PM

## 2018-12-29 NOTE — ED Notes (Signed)
Dr Kerman Passey aware of Potassium and redraw

## 2018-12-29 NOTE — ED Triage Notes (Signed)
Pt arrives via EMS after having pain in both legs- both legs have wounds on them- pt was refusing amputation until yesterday was supposed to have an appointment today- pt hypovolemic with pressure 87/47 per EMS- per [facility] pt has altered mental status

## 2018-12-29 NOTE — ED Notes (Signed)
Repeat green top sent to lab °

## 2018-12-29 NOTE — Progress Notes (Signed)
CODE SEPSIS - PHARMACY COMMUNICATION  **Broad Spectrum Antibiotics should be administered within 1 hour of Sepsis diagnosis**  Time Code Sepsis Called/Page Received: 09:45  Antibiotics Ordered: Cefepime, Vancomycin, Metronidazole  Time of 1st antibiotic administration: Cefepime given at 10:03  Additional action taken by pharmacy: n/a  If necessary, Name of Provider/Nurse Contacted: n/a    Vira Blanco ,PharmD Clinical Pharmacist  01/01/2019  10:48 AM

## 2018-12-29 NOTE — H&P (Addendum)
[facility] [place] NAME: Alan Marshall    MR#:  [id number]  DATE OF BIRTH:  09/12/1935  DATE OF ADMISSION:  12/19/2018  PRIMARY CARE PHYSICIAN: Alvester Morin, MD   REQUESTING/REFERRING PHYSICIAN: Harvest Dark, MD  CHIEF COMPLAINT:   Chief Complaint  Patient presents with  . Leg Pain    HISTORY OF PRESENT ILLNESS:  83 y.o. male with pertinent past medical history of atrial fibrillation, chronic systolic HF, hyperlipidemia, hypertension, AICD present, glaucoma, peripheral neuropathy, ulcers of lower extremity, CKD stage IV, anemia, hypothyroidism and GERD presenting to the ED with altered mental status.  Per EMS, staff at the facility reports that patient has had chronic ulcers with drainage in his bilateral lower extremities.  He has been advised to have both legs amputated however patient has refused on multiple occasions.  However yesterday he consented to having evaluation done by vascular and was supposed to have an appointment today when he was noted with altered mental status with a blood pressure of 87/47.  No reports of chest pain, abdominal pain, nausea vomiting, diarrhea, dysuria, shortness of breath or any other associated symptoms.  On arrival to the ED, he was afebrile with low blood pressure 88/52 mm Hg and pulse rate 81 beats/min. There were no focal neurological deficits; he was lethargic but still able to maintain his airway.  Initial lactic acid 1.1, WBC 11.4, potassium 6.6, BUN 134, creatinine 3.9.  Chest x-ray showed mild left basilar opacity with possible effusion.` He will be admitted under hospitalist service for further management.  PAST MEDICAL HISTORY:   Past Medical History:  Diagnosis Date  . Anemia   . CKD (chronic kidney disease), stage IV ([place])   . Depression   . GERD (gastroesophageal reflux disease)   . Glaucoma   . Gout   . HFrEF (heart failure with reduced ejection fraction) ([place])    a. 11/2017 Echo: EF <20, diff HK. mild to mod AI. Asc Ao 39mm (mod dil). Mod MR. Mod dil LA/RV/RA. PASP 67mmHg. Mod to large circumferential pericardial effusion w/o tamponade; b. 12/05/2017 Echo: EF 15-20%, mod AI, mild to mod MR, mod TR, PASP 28mmHg, mod pericardial effusion w/o tamponade.  . Hypertension   . Hypothyroidism   . NICM (nonischemic cardiomyopathy) ([place])    a. S/p prior AICD->extracted in 2015 2/2 MSSA bacteremia; b. s/p Subcutaneous ICD; c. 11/2017 Echo: EF <20%; c. 12/05/2017 Echo: EF 15-20%.  . Pericardial effusion    a. 11/2017 Echo: Mod to large circumferential pericardial effusion w/o effusive-constrictive changes or tamponade physiology; b. 12/05/2017 Echo: EF 15-20%, mod pericardial effusion w/o hemodynamic compromise.  [name] Kitchen Permanent Atrial Fibrillation/Flutter ([place])    a. CHA2DS2VASc = 4-->No OAC 2/2 dementia.  . Recurrent left pleural effusion    a. 11/2017 s/p thoracentesis.  Path neg.  . Vitamin D deficiency     PAST SURGICAL HISTORY:   Past Surgical History:  Procedure Laterality Date  . INSERT / REPLACE / REMOVE PACEMAKER    . KNEE SURGERY Right     SOCIAL HISTORY:   Social History   Tobacco Use  . Smoking status: Never Smoker  . Smokeless tobacco: Never Used  Substance Use Topics  . Alcohol use: Not Currently    FAMILY HISTORY:   Family History  Problem Relation Age of Onset  . Hypertension Father     DRUG ALLERGIES:  No Known Allergies  REVIEW OF SYSTEMS:   ROS  unable to obtain from patient  due to altered mental status.  MEDICATIONS AT HOME:   Prior to Admission medications   Medication Sig Start Date End Date Taking? Authorizing Provider  acetaminophen (TYLENOL) 500 MG tablet Take 1,000 mg by mouth 3 (three) times daily.    Yes [provider]  allopurinol (ZYLOPRIM) 100 MG tablet Take 0.5 tablets (50 mg total) by mouth daily. Patient taking differently: Take 50 mg by mouth daily with supper.  11/20/17  Yes Fritzi Mandes, MD   amiodarone (PACERONE) 100 MG tablet Take 100 mg by mouth daily.   Yes [provider]  carvedilol (COREG) 3.125 MG tablet Take 1 tablet (3.125 mg total) by mouth 2 (two) times daily with a meal. 11/20/17  Yes Fritzi Mandes, MD  chlorhexidine (HIBICLENS) 4 % external liquid Apply topically daily as needed. Wash lower legs daily during dressing changes   Yes [provider]  citalopram (CELEXA) 20 MG tablet Take 20 mg by mouth daily.    Yes [provider]  dicloxacillin (DYNAPEN) 500 MG capsule Take 500 mg by mouth every 8 (eight) hours. 12/10/17  Yes [provider]  diphenhydrAMINE (BENADRYL) 25 MG tablet Take 12.5 mg by mouth daily as needed for itching.   Yes [provider]  divalproex (DEPAKOTE SPRINKLE) 125 MG capsule Take 125 mg by mouth 2 (two) times daily.   Yes [provider]  dorzolamide (TRUSOPT) 2 % ophthalmic solution Place 1 drop into both eyes 2 (two) times daily.   Yes [provider]  ferrous sulfate 324 MG TBEC Take 324 mg by mouth daily.   Yes [provider]  Ipratropium-Albuterol (COMBIVENT RESPIMAT) 20-100 MCG/ACT AERS respimat Inhale 2 puffs into the lungs every 6 (six) hours as needed for wheezing or shortness of breath.   Yes [provider]  latanoprost (XALATAN) 0.005 % ophthalmic solution Place 1 drop into both eyes at bedtime.   Yes [provider]  levothyroxine (SYNTHROID, LEVOTHROID) 112 MCG tablet Take 112 mcg by mouth daily before breakfast.   Yes [provider]  loratadine (CLARITIN) 10 MG tablet Take 10 mg by mouth daily.   Yes [provider]  Melatonin 3 MG TABS Take 3 mg by mouth at bedtime as needed (insomnia).    Yes [provider]  Menthol-Zinc Oxide 0.44-20.625 % OINT Apply topically 2 (two) times daily. To scrotum   Yes [provider]  metolazone (ZAROXOLYN) 2.5 MG tablet Take 1 tablet (2.5 mg total) by mouth as needed. For a weight  gain of 3 pounds overnight or 5 pounds in one week Patient taking differently: Take 2.5 mg by mouth daily as needed (for weight gain).  04/07/18 12/14/2018 Yes Dunn, Areta Haber, PA-C  oxyCODONE (OXY IR/ROXICODONE) 5 MG immediate release tablet Take 5 mg by mouth every 4 (four) hours as needed for severe pain.   Yes [provider]  polyethylene glycol powder (GAVILAX) powder Take 17 g by mouth daily.   Yes [provider]  potassium chloride SA (K-DUR,KLOR-CON) 20 MEQ tablet Take 20 mEq by mouth daily.   Yes [provider]  sennosides-docusate sodium (SENOKOT-S) 8.6-50 MG tablet Take 1 tablet by mouth 2 (two) times daily.   Yes [provider]  simethicone (MYLICON) 80 MG chewable tablet Chew 80 mg by mouth every 6 (six) hours as needed for flatulence.   Yes [provider]  Skin Protectants, Misc. (EUCERIN) cream Apply 1 application topically 2 (two) times daily. To feet, legs, arms   Yes  [provider]  torsemide (DEMADEX) 20 MG tablet Take 2 tablets (40 mg total) by mouth 2 (two) times daily. Patient taking differently: Take 30-40 mg by mouth 2 (two) times daily. Take 40 mg in the morning, and 30 mg in the evening 11/20/17  Yes Fritzi Mandes, MD      VITAL SIGNS:  Blood pressure (!) 92/56, pulse 64, temperature 98 F (36.7 C), temperature source Oral, resp. rate 11, height 6' (1.829 m), weight 54.4 kg, SpO2 100 %.  PHYSICAL EXAMINATION:   Physical Exam  GENERAL:  83 y.o.-year-old patient lying in the bed with no acute distress.  EYES: Pupils equal, round, reactive to light and accommodation. No scleral icterus. Extraocular muscles intact.  HEENT: Head atraumatic, normocephalic. Oropharynx and nasopharynx clear.  NECK:  Supple, no jugular venous distention. No thyroid enlargement, no tenderness.  LUNGS: Normal breath sounds bilaterally, no wheezing, rales,rhonchi or crepitation. No use of accessory muscles of respiration.  CARDIOVASCULAR: S1,  S2 normal. No murmurs, rubs, or gallops.  ABDOMEN: Soft, nontender, nondistended. Bowel sounds present. No organomegaly or mass.  EXTREMITIES: See images below. diminished pedal pulses MUSCULOSKELETAL: Normal bulk, and power was 5+ grip and elbow, knee, and ankle flexion and extension bilaterally.  NEUROLOGIC:Lethargic. UTA Sensation to light touch and cold stimuli intact bilaterally. Gait not tested due to safety concern. PSYCHIATRIC: The patient is alert and oriented x 1  SKIN: see below         DATA REVIEWED:  LABORATORY PANEL:   CBC Recent Labs  Lab 01/05/2019 0943  WBC 11.4*  HGB 9.9*  HCT 30.9*  PLT 208   ------------------------------------------------------------------------------------------------------------------  Chemistries  Recent Labs  Lab 01/08/2019 1038  NA 145  K 6.6*  CL 125*  CO2 11*  GLUCOSE 85  BUN 134*  CREATININE 3.96*  CALCIUM 7.9*  AST 24  ALT 12  ALKPHOS 65  BILITOT 0.9   ------------------------------------------------------------------------------------------------------------------  Cardiac Enzymes No results for input(s): TROPONINI in the last 168 hours. ------------------------------------------------------------------------------------------------------------------  RADIOLOGY:  Dg Chest Port 1 View  Result Date: 01/07/2019 CLINICAL DATA:  Sepsis. EXAM: PORTABLE CHEST 1 VIEW COMPARISON:  Radiograph December 25, 2017. FINDINGS: Stable cardiomegaly. Atherosclerosis of thoracic aorta is noted. No pneumothorax is noted. Right lung is clear. Mild left basilar atelectasis or infiltrate and possible associated effusion may be present. Bony thorax unremarkable. IMPRESSION: Mild left basilar opacity as described above. Aortic Atherosclerosis (ICD10-I70.0). Electronically Signed   By: Marijo Conception M.D.   On: 12/23/2018 10:23    EKG:  EKG: normal EKG, normal sinus rhythm, unchanged from previous tracings. Vent. rate 81 BPM PR interval * ms  QRS duration 134 ms QT/QTc 439/510 ms P-R-T axes 0 215 63 IMPRESSION AND PLAN:   83 y.o. male with pertinent past medical history of atrial fibrillation, chronic systolic HF, hyperlipidemia, hypertension, AICD present, glaucoma, peripheral neuropathy, ulcers of lower extremity, CKD stage IV, anemia, hypothyroidism and GERD presenting to the ED with altered mental status.  1. Sepsis- Likely due to necrosis of bilateral lower extremities and decubitus ulcers in bottom and sacral area - Admit to medsurg unit with telemetry monitoring - IVFs with PRN Bolus for euvolemia - If persistent hypotension not improving with IV fluids may need vasoactive drips - Chest x-ray showed mild left basilar opacity with possible effusion. - Blood cultures pending - Urine cultures pending - Start empiric antibiotics with cefepime and vancomycin  2.  Altered mental status -likely due to sepsis as above  3.  Bilateral lower  extremity necrosis - patient was supposed to have vascular appointment today to evaluate for possible bilateral amputation - chronic hx of bilateral lower extremity ulcers and necrosis - Empiric abx as above - Wound care consult -Vascular consult placed to Dr. Lucky Cowboy and message sent via [facility]   4.  Acute on chronic kidney disease-acute presentation likely due to prerenal causes - IVFs - Avoid nephrotoxins - We will continue to monitor renal function  5. Chronic systolic CHF - Holding metolazone, Coreg and torsemide due to hypotension  6. Hyperkalemia - Received Lokelma in the ED - Hold potassium supplements - Recheck K+ and Mag  7.  Severe hypotension secondary to sepsis IV fluid bolus given Start patient on IV Levophed drip for support of blood pressure Patient will be admitted to critical care unit Intensivist consultation  8. Chronic atrial fibrillation -heart rate low will hold amiodarone and reevaluate will need to restart if needed  9.  Hypothyroidism -continue Synthroid    All the records are reviewed and case discussed with ED provider. Management plans discussed with the patient, family and they are in agreement.  CODE STATUS: FULL  TOTAL CRITICAL CARE TIME TAKING CARE OF THIS PATIENT: 52 minutes.    on 12/14/2018 at 3:35 PM  Rufina Falco, DNP, FNP-BC [facility] Nurse Practitioner Between 7am to 6pm - Pager [phone]  After 6pm go to [email or web address] - password [email or web address] [facility]  Office  [phone]  [name]; Alvester Morin, MD

## 2018-12-29 NOTE — ED Provider Notes (Signed)
[facility] Emergency Department Provider Note  Time seen: 9:46 AM  I have reviewed the triage vital signs and the nursing notes.   HISTORY  Chief Complaint Leg Pain   HPI Alan Marshall is a 83 y.o. male with a past medical history of CKD, gastric reflux, hypertension, chronic lower extremity wounds presents to the emergency department for lower extremity pain and hypotension.  Patient lives at [facility], per report is been complaining of increased lower extremity pain found to be hypotensive in the 80s today.  They state the patient had initially been refusing lower extremity amputation which had been recommended however as of recently the patient is agreeable to amputation.  Patient denies any chest pain or abdominal pain, nausea vomiting or diarrhea, dysuria.   Past Medical History:  Diagnosis Date  . Anemia   . CKD (chronic kidney disease), stage IV ([place])   . Depression   . GERD (gastroesophageal reflux disease)   . Glaucoma   . Gout   . HFrEF (heart failure with reduced ejection fraction) ([place])    a. 11/2017 Echo: EF <20, diff HK. mild to mod AI. Asc Ao 20mm (mod dil). Mod MR. Mod dil LA/RV/RA. PASP 5mmHg. Mod to large circumferential pericardial effusion w/o tamponade; b. 12/05/2017 Echo: EF 15-20%, mod AI, mild to mod MR, mod TR, PASP 60mmHg, mod pericardial effusion w/o tamponade.  . Hypertension   . Hypothyroidism   . NICM (nonischemic cardiomyopathy) ([place])    a. S/p prior AICD->extracted in 2015 2/2 MSSA bacteremia; b. s/p Subcutaneous ICD; c. 11/2017 Echo: EF <20%; c. 12/05/2017 Echo: EF 15-20%.  . Pericardial effusion    a. 11/2017 Echo: Mod to large circumferential pericardial effusion w/o effusive-constrictive changes or tamponade physiology; b. 12/05/2017 Echo: EF 15-20%, mod pericardial effusion w/o hemodynamic compromise.  [name] Kitchen Permanent Atrial Fibrillation/Flutter ([place])    a. CHA2DS2VASc = 4-->No OAC 2/2 dementia.  . Recurrent left  pleural effusion    a. 11/2017 s/p thoracentesis.  Path neg.  . Vitamin D deficiency     Patient Active Problem List   Diagnosis Date Noted  . Palliative care encounter 06/19/2018  . Generalized edema 06/19/2018  . Debility 06/19/2018  . GI bleed 04/25/2018  . Hypotension 12/10/2017  . Atrial fibrillation ([place]) 12/10/2017  . Pleural effusion 12/10/2017  . CHF (congestive heart failure) ([place]) 11/15/2017    Past Surgical History:  Procedure Laterality Date  . INSERT / REPLACE / REMOVE PACEMAKER    . KNEE SURGERY Right     Prior to Admission medications   Medication Sig Start Date End Date Taking? Authorizing Provider  acetaminophen (TYLENOL) 325 MG tablet Take 975 mg by mouth every 8 (eight) hours as needed for mild pain, moderate pain or fever.     [provider]  allopurinol (ZYLOPRIM) 100 MG tablet Take 0.5 tablets (50 mg total) by mouth daily. Patient taking differently: Take 50 mg by mouth daily with supper.  11/20/17   Fritzi Mandes, MD  amiodarone (PACERONE) 100 MG tablet Take 100 mg by mouth daily.    [provider]  bisacodyl (DULCOLAX) 10 MG suppository Place 10 mg rectally daily as needed for moderate constipation.    [provider]  carvedilol (COREG) 3.125 MG tablet Take 1 tablet (3.125 mg total) by mouth 2 (two) times daily with a meal. 11/20/17   Fritzi Mandes, MD  citalopram (CELEXA) 10 MG tablet Take 10 mg by mouth daily.    [provider]  dicloxacillin (DYNAPEN) 500 MG capsule Take 500 mg by mouth every 8 (eight) hours. 12/10/17   [provider]  dorzolamide (TRUSOPT) 2 % ophthalmic solution Place 1 drop into both eyes 2 (two) times daily.    [provider]  ipratropium-albuterol (DUONEB) 0.5-2.5 (3) MG/3ML SOLN Take 3 mLs by nebulization every 6 (six) hours as needed (for shortness of breath).     [provider]  latanoprost (XALATAN) 0.005 % ophthalmic solution Place 1 drop into both eyes at bedtime.     [provider]  levothyroxine (SYNTHROID, LEVOTHROID) 112 MCG tablet Take 112 mcg by mouth daily before breakfast.    [provider]  loratadine (CLARITIN) 10 MG tablet Take 10 mg by mouth daily.    [provider]  Melatonin 3 MG TABS Take 3 mg by mouth at bedtime.     [provider]  Menthol-Zinc Oxide 0.44-20.625 % OINT Apply topically 2 (two) times daily. To scrotum    [provider]  metolazone (ZAROXOLYN) 2.5 MG tablet Take 1 tablet (2.5 mg total) by mouth as needed. For a weight gain of 3 pounds overnight or 5 pounds in one week Patient taking differently: Take 2.5 mg by mouth daily as needed (for weight gain).  04/07/18 07/06/18  Dunn, Areta Haber, PA-C  polyethylene glycol powder (GAVILAX) powder Take 17 g by mouth daily.    [provider]  potassium chloride SA (K-DUR,KLOR-CON) 20 MEQ tablet Take 20 mEq by mouth daily.    [provider]  sennosides-docusate sodium (SENOKOT-S) 8.6-50 MG tablet Take 1 tablet by mouth 2 (two) times daily.    [provider]  simethicone (MYLICON) 80 MG chewable tablet Chew 80 mg by mouth every 6 (six) hours as needed for flatulence.    [provider]  Skin Protectants, Misc. (EUCERIN) cream Apply 1 application topically 2 (two) times daily.    [provider]  torsemide (DEMADEX) 20 MG tablet Take 2 tablets (40 mg total) by mouth 2 (two) times daily. 11/20/17   Fritzi Mandes, MD    No Known Allergies  Family History  Problem Relation Age of Onset  . Hypertension Father     Social History Social History   Tobacco Use  . Smoking status: Never Smoker  . Smokeless tobacco: Never Used  Substance Use Topics  . Alcohol use: Not Currently  . Drug use: Never    Review of Systems Constitutional: Negative for fever. Cardiovascular: Negative for chest pain. Respiratory: Negative for shortness of breath. Gastrointestinal: Negative for abdominal pain, vomiting and  diarrhea Musculoskeletal: Positive for lower extremity pain Skin: Chronic lower extremity ulcerations/wounds Neurological: Negative for headache All other ROS negative  ____________________________________________   PHYSICAL EXAM:  VITAL SIGNS: ED Triage Vitals [12/12/2018 0937]  Enc Vitals Group     BP (!) 88/52     Pulse Rate 82     Resp 18     Temp 98 F (36.7 C)     Temp Source Oral     SpO2 98 %     Weight 120 lb (54.4 kg)     Height 6' (1.829 m)     Head Circumference      Peak Flow      Pain Score      Pain Loc      Pain Edu?      Excl. in [place]?    Constitutional: Alert.  No acute distress, but occasionally moaning in discomfort.  Able to answer questions  appropriately and follows commands. Eyes: Normal exam ENT      Head: Normocephalic and atraumatic.      Mouth/Throat: Mucous membranes are moist. Cardiovascular: Normal rate, regular rhythm.  Respiratory: Normal respiratory effort without tachypnea nor retractions. Breath sounds are clear  Gastrointestinal: Soft and nontender. No distention.   Musculoskeletal: Patient has multiple ulcerations/wounds to his bilateral lower extremities.  The right lower extremity appears to have exudative drainage from several areas. Neurologic:  Normal speech and language. No gross focal neurologic deficits  Skin:  Skin is warm.  Multiple wounds to bilateral lower extremities with exudative drainage from the right lower extremity. Psychiatric: Mood and affect are normal.   ____________________________________________    EKG  EKG viewed and interpreted by myself shows a normal sinus rhythm at 81 bpm with a narrow QRS, normal axis, nonspecific ST changes.  ____________________________________________    RADIOLOGY  Chest x-ray shows mild left basilar opacity.  ____________________________________________   INITIAL IMPRESSION / ASSESSMENT AND PLAN / ED COURSE  Pertinent labs & imaging results that were available during my  care of the patient were reviewed by me and considered in my medical decision making (see chart for details).   Patient presents to the emergency department for lower extremity discomfort and hypotension.  Differential would include sepsis, chronic wounds, abscess, cellulitis, bacteremia.  We will check labs, cultures start broad-spectrum antibiotics and swab for coronavirus.  Patient will require admission to the hospital.  We will IV hydrate.  Highly suspect sepsis secondary to right lower extremity infection.  X-ray shows mild left basilar opacity, atelectasis versus pneumonia.  Lab work shows significant hyperkalemia.  We will continue with IV fluids we will dose lokelma.  We will admit to the hospital service for continued IV antibiotics continued fluids per renal insufficiency hyperkalemia and sepsis.  Azariah Bonura was evaluated in Emergency Department on 12/27/2018 for the symptoms described in the history of present illness. He was evaluated in the context of the global COVID-19 pandemic, which necessitated consideration that the patient might be at risk for infection with the SARS-CoV-2 virus that causes COVID-19. Institutional protocols and algorithms that pertain to the evaluation of patients at risk for COVID-19 are in a state of rapid change based on information released by regulatory bodies including the CDC and federal and state organizations. These policies and algorithms were followed during the patient's care in the ED.   CRITICAL CARE Performed by: Harvest Dark   Total critical care time: 30 minutes  Critical care time was exclusive of separately billable procedures and treating other patients.  Critical care was necessary to treat or prevent imminent or life-threatening deterioration.  Critical care was time spent personally by me on the following activities: development of treatment plan with patient and/or surrogate as well as nursing, discussions with consultants,  evaluation of patient's response to treatment, examination of patient, obtaining history from patient or surrogate, ordering and performing treatments and interventions, ordering and review of laboratory studies, ordering and review of radiographic studies, pulse oximetry and re-evaluation of patient's condition.  ____________________________________________   FINAL CLINICAL IMPRESSION(S) / ED DIAGNOSES  Sepsis Cellulitis Hyperkalemia Renal insufficiency    Harvest Dark, MD 12/19/2018 1126

## 2018-12-29 NOTE — Consult Note (Addendum)
Pharmacy Antibiotic Note  Alan Marshall is a 83 y.o. male admitted on 01/03/2019 with wound infection with bilateral necrosis on legs. It was noted that patient has been refusing amputation, until yesterday. He is a resident of a nursing home. Pharmacy has been consulted for Vancomycin and Cefepime dosing. Patient may have an amputation tomorrow.   Patient received a dose of metronidazole, vancomycin @1043 , and cefepime @1003  in the ED.   CrCl 11.4 mL/min  Plan: 1) Will order Cefepime 2 gm Q24H (CrCl 11-29 mL/min). May consider adjusting dose proceeding AM labs.   2) Will start Vancomycin 500 mg Q48H on 7/23 @ 10 AM. Will obtain a 24-hr VR and recheck Scr with AM labs.  AUC Goal: 400-550 Expected AUC 459.1 Cssmin 13.5 Scr: 3.96 mg/dL   Height: 6' (182.9 cm) Weight: 120 lb (54.4 kg) IBW/kg (Calculated) : 77.6  Temp (24hrs), Avg:98 F (36.7 C), Min:98 F (36.7 C), Max:98 F (36.7 C)  Recent Labs  Lab 12/27/2018 0943 12/13/2018 1038  WBC 11.4*  --   CREATININE  --  3.96*  LATICACIDVEN 1.1  --     Estimated Creatinine Clearance: 11.4 mL/min (A) (by C-G formula based on SCr of 3.96 mg/dL (H)).    No Known Allergies  Antimicrobials this admission: 07/21 Metronidazole x1 dose 07/21 Cefepime >> 07/21 Vancomycin >>   Dose adjustments this admission: N/A  Microbiology results: 7/21 BCx: pending 7/21 UCx: pending   Thank you for allowing pharmacy to be a part of this patient's care.  Rowland Lathe 12/17/2018 12:41 PM

## 2018-12-29 NOTE — ED Notes (Signed)
ED TO INPATIENT HANDOFF REPORT  ED Nurse Name and Phone #: bill [phone]  S Name/Age/Gender Alan Marshall 83 y.o. male Room/Bed: ED07A/ED07A  Code Status   Code Status: Full Code  Home/SNF/Other Nursing Home Patient oriented to: self and place Is this baseline? unknown  Triage Complete: Triage complete  Chief Complaint leg pain  Triage Note Pt arrives via EMS after having pain in both legs- both legs have wounds on them- pt was refusing amputation until yesterday was supposed to have an appointment today- pt hypovolemic with pressure 87/47 per EMS- per [facility] pt has altered mental status   Allergies No Known Allergies  Level of Care/Admitting Diagnosis ED Disposition    ED Disposition Condition [place]: [place] [[id number]]  Level of Care: Med-Surg [16]  Covid Evaluation: Confirmed COVID Negative  Diagnosis: Sepsis [facility]) [[id number]]  Admitting Physician: Eula Flax  Attending Physician: Rufina Falco ACHIENG [phone]  Estimated length of stay: past midnight tomorrow  Certification:: I certify this patient will need inpatient services for at least 2 midnights  PT Class (Do Not Modify): Inpatient [101]  PT Acc Code (Do Not Modify): Private [1]       B Medical/Surgery History Past Medical History:  Diagnosis Date  . Anemia   . CKD (chronic kidney disease), stage IV ([place])   . Depression   . GERD (gastroesophageal reflux disease)   . Glaucoma   . Gout   . HFrEF (heart failure with reduced ejection fraction) ([place])    a. 11/2017 Echo: EF <20, diff HK. mild to mod AI. Asc Ao 67mm (mod dil). Mod MR. Mod dil LA/RV/RA. PASP 108mmHg. Mod to large circumferential pericardial effusion w/o tamponade; b. 12/05/2017 Echo: EF 15-20%, mod AI, mild to mod MR, mod TR, PASP 30mmHg, mod pericardial effusion w/o tamponade.  . Hypertension   . Hypothyroidism   . NICM (nonischemic cardiomyopathy) ([place])    a. S/p prior  AICD->extracted in 2015 2/2 MSSA bacteremia; b. s/p Subcutaneous ICD; c. 11/2017 Echo: EF <20%; c. 12/05/2017 Echo: EF 15-20%.  . Pericardial effusion    a. 11/2017 Echo: Mod to large circumferential pericardial effusion w/o effusive-constrictive changes or tamponade physiology; b. 12/05/2017 Echo: EF 15-20%, mod pericardial effusion w/o hemodynamic compromise.  [name] Kitchen Permanent Atrial Fibrillation/Flutter ([place])    a. CHA2DS2VASc = 4-->No OAC 2/2 dementia.  . Recurrent left pleural effusion    a. 11/2017 s/p thoracentesis.  Path neg.  . Vitamin D deficiency    Past Surgical History:  Procedure Laterality Date  . INSERT / REPLACE / REMOVE PACEMAKER    . KNEE SURGERY Right      A IV Location/Drains/Wounds Patient Lines/Drains/Airways Status   Active Line/Drains/Airways    Name:   Placement date:   Placement time:   Site:   Days:   Peripheral IV 04/25/18 Right Forearm   04/25/18    1829    Forearm   248   Peripheral IV 12/25/2018 Left Antecubital   12/18/2018    0944    Antecubital   less than 1   Peripheral IV 12/15/2018 Right Forearm   12/30/2018    0944    Forearm   less than 1   Wound / Incision (Open or Dehisced) 11/15/17 Leg Upper;Right   11/15/17    2159    Leg   409   Wound / Incision (Open or Dehisced) 11/15/17 Leg Right   11/15/17    2201    Leg  409   Wound / Incision (Open or Dehisced) 11/17/17 Incision - Dehisced Knee Right;Upper;Lateral small hole in skin, above knee   11/17/17    2015    Knee   407   Wound / Incision (Open or Dehisced) 04/25/18 Leg Upper;Mid;Right   04/25/18    -    Leg   248   Wound / Incision (Open or Dehisced) 04/25/18 Leg Mid;Right   04/25/18    2325    Leg   248   Wound / Incision (Open or Dehisced) 04/25/18 Leg Lower;Right   04/25/18    2326    Leg   248          Intake/Output Last 24 hours  Intake/Output Summary (Last 24 hours) at 12/30/2018 1636 Last data filed at 12/10/2018 1037 Gross per 24 hour  Intake 100 ml  Output -  Net 100 ml     Labs/Imaging Results for orders placed or performed during the hospital encounter of 12/22/2018 (from the past 48 hour(s))  Lactic acid, plasma     Status: None   Collection Time: 12/22/2018  9:43 AM  Result Value Ref Range   Lactic Acid, Venous 1.1 0.5 - 1.9 mmol/L    Comment: Performed at [facility], [address] [place], [place] [address]  CBC WITH DIFFERENTIAL     Status: Abnormal   Collection Time: 12/18/2018  9:43 AM  Result Value Ref Range   WBC 11.4 (H) 4.0 - 10.5 K/uL   RBC 3.42 (L) 4.22 - 5.81 MIL/uL   Hemoglobin 9.9 (L) 13.0 - 17.0 g/dL   HCT 30.9 (L) 39.0 - 52.0 %   MCV 90.4 80.0 - 100.0 fL   MCH 28.9 26.0 - 34.0 pg   MCHC 32.0 30.0 - 36.0 g/dL   RDW 18.0 (H) 11.5 - 15.5 %   Platelets 208 150 - 400 K/uL   nRBC 0.0 0.0 - 0.2 %   Neutrophils Relative % 79 %   Neutro Abs 9.2 (H) 1.7 - 7.7 K/uL   Lymphocytes Relative 6 %   Lymphs Abs 0.6 (L) 0.7 - 4.0 K/uL   Monocytes Relative 12 %   Monocytes Absolute 1.3 (H) 0.1 - 1.0 K/uL   Eosinophils Relative 2 %   Eosinophils Absolute 0.2 0.0 - 0.5 K/uL   Basophils Relative 0 %   Basophils Absolute 0.1 0.0 - 0.1 K/uL   Immature Granulocytes 1 %   Abs Immature Granulocytes 0.07 0.00 - 0.07 K/uL    Comment: Performed at [facility], [address] [place], [place] [address]  APTT     Status: None   Collection Time: 12/12/2018  9:43 AM  Result Value Ref Range   aPTT 36 24 - 36 seconds    Comment: Performed at [facility], [address] [place], [place] [address]  Protime-INR     Status: Abnormal   Collection Time: 01/01/2019  9:43 AM  Result Value Ref Range   Prothrombin Time 17.1 (H) 11.4 - 15.2 seconds   INR 1.4 (H) 0.8 - 1.2    Comment: (NOTE) INR goal varies based on device and disease states. Performed at [facility], [address] [place], [place] [address]   SARS Coronavirus 2 (CEPHEID - Performed in [facility] hospital lab), [facility] Order     Status: None   Collection  Time: 01/03/2019  9:43 AM   Specimen: Nasopharyngeal Swab  Result Value Ref Range   SARS Coronavirus 2 NEGATIVE NEGATIVE  Comment: (NOTE) If result is NEGATIVE SARS-CoV-2 target nucleic acids are NOT DETECTED. The SARS-CoV-2 RNA is generally detectable in upper and lower  respiratory specimens during the acute phase of infection. The lowest  concentration of SARS-CoV-2 viral copies this assay can detect is 250  copies / mL. A negative result does not preclude SARS-CoV-2 infection  and should not be used as the sole basis for treatment or other  patient management decisions.  A negative result may occur with  improper specimen collection / handling, submission of specimen other  than nasopharyngeal swab, presence of viral mutation(s) within the  areas targeted by this assay, and inadequate number of viral copies  (<250 copies / mL). A negative result must be combined with clinical  observations, patient history, and epidemiological information. If result is POSITIVE SARS-CoV-2 target nucleic acids are DETECTED. The SARS-CoV-2 RNA is generally detectable in upper and lower  respiratory specimens dur ing the acute phase of infection.  Positive  results are indicative of active infection with SARS-CoV-2.  Clinical  correlation with patient history and other diagnostic information is  necessary to determine patient infection status.  Positive results do  not rule out bacterial infection or co-infection with other viruses. If result is PRESUMPTIVE POSTIVE SARS-CoV-2 nucleic acids MAY BE PRESENT.   A presumptive positive result was obtained on the submitted specimen  and confirmed on repeat testing.  While 2019 novel coronavirus  (SARS-CoV-2) nucleic acids may be present in the submitted sample  additional confirmatory testing may be necessary for epidemiological  and / or clinical management purposes  to differentiate between  SARS-CoV-2 and other Sarbecovirus currently known to infect  humans.  If clinically indicated additional testing with an alternate test  methodology [phone]) is advised. The SARS-CoV-2 RNA is generally  detectable in upper and lower respiratory sp ecimens during the acute  phase of infection. The expected result is Negative. Fact Sheet for Patients:  StrictlyIdeas.no Fact Sheet for Healthcare Providers: BankingDealers.co.za This test is not yet approved or cleared by the [place] FDA and has been authorized for detection and/or diagnosis of SARS-CoV-2 by FDA under an Emergency Use Authorization (EUA).  This EUA will remain in effect (meaning this test can be used) for the duration of the COVID-19 declaration under Section 564(b)(1) of the Act, 21 U.S.C. section 360bbb-3(b)(1), unless the authorization is terminated or revoked sooner. Performed at [facility], [address] [place], [place] [address]   Comprehensive metabolic panel     Status: Abnormal   Collection Time: 12/28/2018 10:38 AM  Result Value Ref Range   Sodium 145 135 - 145 mmol/L   Potassium 6.6 (HH) 3.5 - 5.1 mmol/L    Comment: CRITICAL RESULT CALLED TO, READ BACK BY AND VERIFIED WITH ARIEL WALLACE AT 1118 ON 12/18/2018 [place].    Chloride 125 (H) 98 - 111 mmol/L   CO2 11 (L) 22 - 32 mmol/L   Glucose, Bld 85 70 - 99 mg/dL   BUN 134 (H) 8 - 23 mg/dL    Comment: RESULT CONFIRMED BY MANUAL DILUTION...[place]   Creatinine, Ser 3.96 (H) 0.61 - 1.24 mg/dL   Calcium 7.9 (L) 8.9 - 10.3 mg/dL   Total Protein 6.3 (L) 6.5 - 8.1 g/dL   Albumin 2.2 (L) 3.5 - 5.0 g/dL   AST 24 15 - 41 U/L   ALT 12 0 - 44 U/L   Alkaline Phosphatase 65 38 - 126 U/L   Total Bilirubin 0.9 0.3 - 1.2 mg/dL  GFR calc non Af Amer 13 (L) >60 mL/min   GFR calc Af Amer 16 (L) >60 mL/min   Anion gap 9 5 - 15    Comment: Performed at [facility], [address] [place], [place] [address]  Creatinine, serum     Status: Abnormal   Collection  Time: 12/30/2018  3:21 PM  Result Value Ref Range   Creatinine, Ser 3.75 (H) 0.61 - 1.24 mg/dL   GFR calc non Af Amer 14 (L) >60 mL/min   GFR calc Af Amer 17 (L) >60 mL/min    Comment: Performed at [facility], [address], [place], [place] [address]   Dg Chest Port 1 View  Result Date: 12/11/2018 CLINICAL DATA:  Sepsis. EXAM: PORTABLE CHEST 1 VIEW COMPARISON:  Radiograph December 25, 2017. FINDINGS: Stable cardiomegaly. Atherosclerosis of thoracic aorta is noted. No pneumothorax is noted. Right lung is clear. Mild left basilar atelectasis or infiltrate and possible associated effusion may be present. Bony thorax unremarkable. IMPRESSION: Mild left basilar opacity as described above. Aortic Atherosclerosis (ICD10-I70.0). Electronically Signed   By: Marijo Conception M.D.   On: 01/08/2019 10:23    Pending Labs Unresulted Labs (From admission, onward)    Start     Ordered   12/30/18 0500  Vancomycin, random  Tomorrow morning,   STAT     12/15/2018 1256   12/30/18 0500  CBC  Tomorrow morning,   STAT     12/25/2018 1607   12/30/18 4696  Basic metabolic panel  Tomorrow morning,   STAT     01/01/2019 1607   12/30/18 0500  Magnesium  Tomorrow morning,   STAT     01/08/2019 1607   01/06/2019 1602  Magnesium  Add-on,   AD     12/30/2018 1601   01/01/2019 2952  Basic metabolic panel  Add-on,   AD     12/26/2018 1601   12/13/2018 0945  Blood Culture (routine x 2)  BLOOD CULTURE X 2,   STAT     12/18/2018 0945   01/07/2019 0945  Urinalysis, Routine w reflex microscopic  ONCE - STAT,   STAT     12/16/2018 0945   12/16/2018 0945  Urine culture  ONCE - STAT,   STAT     01/06/2019 0945          Vitals/Pain Today's Vitals   12/18/2018 1500 12/14/2018 1515 12/24/2018 1530 12/13/2018 1545  BP: (!) 88/52 119/71 (!) 85/57 (!) 79/53  Pulse:   (!) 57 61  Resp: 12 17 11 11   Temp:      TempSrc:      SpO2:   99% 100%  Weight:      Height:        Isolation Precautions No active isolations  Medications Medications   acetaminophen (TYLENOL) tablet 1,000 mg (has no administration in time range)  allopurinol (ZYLOPRIM) tablet 50 mg (has no administration in time range)  oxyCODONE (Oxy IR/ROXICODONE) immediate release tablet 5 mg (has no administration in time range)  amiodarone (PACERONE) tablet 100 mg (100 mg Oral Not Given 12/14/2018 1634)  citalopram (CELEXA) tablet 20 mg (20 mg Oral Not Given 01/06/2019 1635)  levothyroxine (SYNTHROID) tablet 112 mcg (has no administration in time range)  polyethylene glycol powder (GLYCOLAX/MIRALAX) container 17 g (has no administration in time range)  sennosides-docusate sodium (SENOKOT-S) 8.6-50 MG tablet 1 tablet (has no administration in time range)  simethicone (MYLICON) chewable tablet 80 mg (has no administration in time range)  ferrous sulfate EC tablet 324 mg (has no administration in time range)  Melatonin TABS 3 mg (has no administration in time range)  loratadine (CLARITIN) tablet 10 mg (10 mg Oral Not Given 12/10/2018 1635)  dorzolamide (TRUSOPT) 2 % ophthalmic solution 1 drop (has no administration in time range)  latanoprost (XALATAN) 0.005 % ophthalmic solution 1 drop (has no administration in time range)  Menthol-Zinc Oxide 0.44-20.625 % OINT (has no administration in time range)  eucerin cream 1 application (has no administration in time range)  0.9 %  sodium chloride infusion ( Intravenous New Bag/Given 12/30/2018 1507)  ceFEPIme (MAXIPIME) 2 g in sodium chloride 0.9 % 100 mL IVPB (has no administration in time range)  vancomycin (VANCOCIN) 500 mg in sodium chloride 0.9 % 100 mL IVPB (has no administration in time range)  ceFEPIme (MAXIPIME) 2 g in sodium chloride 0.9 % 100 mL IVPB (0 g Intravenous Stopped 12/13/2018 1037)  metroNIDAZOLE (FLAGYL) IVPB 500 mg (0 mg Intravenous Stopped 12/11/2018 1119)  vancomycin (VANCOCIN) IVPB 1000 mg/200 mL premix (0 mg Intravenous Stopped 01/04/2019 1153)  sodium chloride 0.9 % bolus 1,000 mL (0 mLs Intravenous Stopped 12/25/2018 1119)   sodium chloride 0.9 % bolus 1,000 mL (0 mLs Intravenous Stopped 12/31/2018 1153)  sodium zirconium cyclosilicate (LOKELMA) packet 10 g (10 g Oral Given 01/01/2019 1146)  sodium chloride 0.9 % bolus 500 mL (500 mLs Intravenous New Bag/Given 12/13/2018 1625)  morphine 2 MG/ML injection 2 mg (2 mg Intravenous Given 12/31/2018 1626)    Mobility non-ambulatory Moderate fall risk   Focused Assessments septic   R Recommendations: See Admitting Provider Note  Report given to:   Additional Notes:

## 2018-12-29 NOTE — H&P (Signed)
PATIENT NAME: Alan Marshall    MR#:  [id number] DATE OF BIRTH:  [birth year] DATE OF ADMISSION:  01/01/2019 PRIMARY CAREPHYSICIAN: Alvester Morin, MD  REQUESTING/REFERRING PHYSICIAN: Harvest Dark, MD  CHIEF COMPLAINT:   Septic shock  HISTORY OF PRESENT ILLNESS:  83 y.o. male with pertinent past medical history of atrial fibrillation, chronic systolic HF,CKD stage 4 hyperlipidemia, hypertension, AICD present, glaucoma, peripheral neuropathy + ulcers of lower extremity +ulcers on buttocks and scrotum  presenting to the ED with altered mental status.  + chronic ulcers with drainage in his bilateral lower extremities He has been advised to have both legs amputated however patient has refused on multiple occasions  - he consented to having evaluation done by vascular and was supposed to have an appointment today when he was noted with altered mental status with a blood pressure of 87/47  Patient screaming and pain and suffering    Initial lactic acid 1.1, WBC 11.4, potassium 6.6, BUN 134, creatinine 3.9 Patient was given multiple vasopressors  Patient is emaciated and poor condition Patient is rotting away   PAST MEDICAL HISTORY:   Past Medical History:  Diagnosis Date  . Anemia   . CKD (chronic kidney disease), stage IV ([place])   . Depression   . GERD (gastroesophageal reflux disease)   . Glaucoma   . Gout   . HFrEF (heart failure with reduced ejection fraction) ([place])    a. 11/2017 Echo: EF <20, diff HK. mild to mod AI. Asc Ao 33mm (mod dil). Mod MR. Mod dil LA/RV/RA. PASP 14mmHg. Mod to large circumferential pericardial effusion w/o tamponade; b. 12/05/2017 Echo: EF 15-20%, mod AI, mild to mod MR, mod TR, PASP 27mmHg, mod pericardial effusion w/o tamponade.  . Hypertension   . Hypothyroidism   . NICM (nonischemic cardiomyopathy) ([place])    a. S/p prior AICD->extracted in 2015 2/2 MSSA bacteremia; b. s/p Subcutaneous ICD; c. 11/2017 Echo: EF <20%; c. 12/05/2017 Echo:  EF 15-20%.  . Pericardial effusion    a. 11/2017 Echo: Mod to large circumferential pericardial effusion w/o effusive-constrictive changes or tamponade physiology; b. 12/05/2017 Echo: EF 15-20%, mod pericardial effusion w/o hemodynamic compromise.  [name] Kitchen Permanent Atrial Fibrillation/Flutter ([place])    a. CHA2DS2VASc = 4-->No OAC 2/2 dementia.  . Recurrent left pleural effusion    a. 11/2017 s/p thoracentesis.  Path neg.  . Vitamin D deficiency     PAST SURGICAL HISTORY:   Past Surgical History:  Procedure Laterality Date  . INSERT / REPLACE / REMOVE PACEMAKER    . KNEE SURGERY Right     SOCIAL HISTORY:   Social History   Tobacco Use  . Smoking status: Never Smoker  . Smokeless tobacco: Never Used  Substance Use Topics  . Alcohol use: Not Currently    FAMILY HISTORY:   Family History  Problem Relation Age of Onset  . Hypertension Father     DRUG ALLERGIES:  No Known Allergies  REVIEW OF SYSTEMS:   REVIEW OF SYSTEMS  PATIENT IS UNABLE TO PROVIDE COMPLETE REVIEW OF SYSTEM S DUE TO SEVERE CRITICAL ILLNESS AND ENCEPHALOPATHY   MEDICATIONS AT HOME:   Prior to Admission medications   Medication Sig Start Date End Date Taking? Authorizing Provider  acetaminophen (TYLENOL) 500 MG tablet Take 1,000 mg by mouth 3 (three) times daily.    Yes [provider]  allopurinol (ZYLOPRIM) 100 MG tablet Take 0.5 tablets (50 mg total) by mouth daily. Patient taking differently: Take 50 mg by mouth daily with supper.  11/20/17  Yes Fritzi Mandes, MD  amiodarone (PACERONE) 100 MG tablet Take 100 mg by mouth daily.   Yes [provider]  carvedilol (COREG) 3.125 MG tablet Take 1 tablet (3.125 mg total) by mouth 2 (two) times daily with a meal. 11/20/17  Yes Fritzi Mandes, MD  chlorhexidine (HIBICLENS) 4 % external liquid Apply topically daily as needed. Wash lower legs daily during dressing changes   Yes [provider]  citalopram (CELEXA) 20 MG tablet Take 20 mg by mouth  daily.    Yes [provider]  dicloxacillin (DYNAPEN) 500 MG capsule Take 500 mg by mouth every 8 (eight) hours. 12/10/17  Yes [provider]  diphenhydrAMINE (BENADRYL) 25 MG tablet Take 12.5 mg by mouth daily as needed for itching.   Yes [provider]  divalproex (DEPAKOTE SPRINKLE) 125 MG capsule Take 125 mg by mouth 2 (two) times daily.   Yes [provider]  dorzolamide (TRUSOPT) 2 % ophthalmic solution Place 1 drop into both eyes 2 (two) times daily.   Yes [provider]  ferrous sulfate 324 MG TBEC Take 324 mg by mouth daily.   Yes [provider]  Ipratropium-Albuterol (COMBIVENT RESPIMAT) 20-100 MCG/ACT AERS respimat Inhale 2 puffs into the lungs every 6 (six) hours as needed for wheezing or shortness of breath.   Yes [provider]  latanoprost (XALATAN) 0.005 % ophthalmic solution Place 1 drop into both eyes at bedtime.   Yes [provider]  levothyroxine (SYNTHROID, LEVOTHROID) 112 MCG tablet Take 112 mcg by mouth daily before breakfast.   Yes [provider]  loratadine (CLARITIN) 10 MG tablet Take 10 mg by mouth daily.   Yes [provider]  Melatonin 3 MG TABS Take 3 mg by mouth at bedtime as needed (insomnia).    Yes [provider]  Menthol-Zinc Oxide 0.44-20.625 % OINT Apply topically 2 (two) times daily. To scrotum   Yes [provider]  metolazone (ZAROXOLYN) 2.5 MG tablet Take 1 tablet (2.5 mg total) by mouth as needed. For a weight gain of 3 pounds overnight or 5 pounds in one week Patient taking differently: Take 2.5 mg by mouth daily as needed (for weight gain).  04/07/18 12/15/2018 Yes Dunn, Areta Haber, PA-C  oxyCODONE (OXY IR/ROXICODONE) 5 MG immediate release tablet Take 5 mg by mouth every 4 (four) hours as needed for severe pain.   Yes [provider]  polyethylene glycol powder (GAVILAX) powder Take 17 g by mouth daily.   Yes [provider]   potassium chloride SA (K-DUR,KLOR-CON) 20 MEQ tablet Take 20 mEq by mouth daily.   Yes [provider]  sennosides-docusate sodium (SENOKOT-S) 8.6-50 MG tablet Take 1 tablet by mouth 2 (two) times daily.   Yes [provider]  simethicone (MYLICON) 80 MG chewable tablet Chew 80 mg by mouth every 6 (six) hours as needed for flatulence.   Yes [provider]  Skin Protectants, Misc. (EUCERIN) cream Apply 1 application topically 2 (two) times daily. To feet, legs, arms   Yes [provider]  torsemide (DEMADEX) 20 MG tablet Take 2 tablets (40 mg total) by mouth 2 (two) times daily. Patient taking differently: Take 30-40 mg by mouth 2 (two) times daily. Take 40 mg in the morning, and 30 mg in the evening 11/20/17  Yes Fritzi Mandes, MD      VITAL SIGNS:  Blood pressure 114/66, pulse 82, temperature 98 F (36.7 C), temperature source Oral, resp. rate 20, height 6' (  1.829 m), weight 54.4 kg, SpO2 91 %.  PHYSICAL EXAMINATION:   Physical Exam PHYSICAL EXAMINATION:  GENERAL:critically ill appearing, emaciated frail HEAD: Normocephalic, atraumatic.  EYES: Pupils equal, round, reactive to light.  No scleral icterus.  MOUTH: dry mucosal membrane. NECK: Supple. No thyromegaly. No nodules. + JVD.  PULMONARY: +rhonchi, +wheezing CARDIOVASCULAR: S1 and S2. Regular rate and rhythm. No murmurs, rubs, or gallops.  GASTROINTESTINAL: Soft, nontender, -distended. No masses. Positive bowel sounds. No hepatosplenomegaly.  MUSCULOSKELETAL see pics below NEUROLOGIC: screaming in pain SKIN:intact,warm,dry            DATA REVIEWED:  LABORATORY PANEL:   CBC Recent Labs  Lab 12/20/2018 0943  WBC 11.4*  HGB 9.9*  HCT 30.9*  PLT 208   ------------------------------------------------------------------------------------------------------------------  Chemistries  Recent Labs  Lab 01/06/2019 1038 12/15/2018 1521  NA 145 145  K 6.6* 6.6*  CL 125* 122*  CO2 11*  12*  GLUCOSE 85 107*  BUN 134* 128*  CREATININE 3.96* 3.87*  3.75*  CALCIUM 7.9* 8.5*  MG  --  2.5*  AST 24  --   ALT 12  --   ALKPHOS 65  --   BILITOT 0.9  --    ------------------------------------------------------------------------------------------------------------------  Cardiac Enzymes No results for input(s): TROPONINI in the last 168 hours. ------------------------------------------------------------------------------------------------------------------      IMPRESSION AND PLAN:   83 yo AAM with with acute and severe septic shock with acute renal failure with purulent drainage from lower ext ulcers with severe PAD with severe malnutrition Patient screaming in pain and suffering  Septic shock -use vasopressors to keep MAP>65 -follow ABG and LA -follow up cultures -emperic ABX -aggressive IV fluid Resuscitation  INFECTIOUS DISEASE -continue antibiotics as prescribed -follow up cultures   NEUROLOGY Confusion agitated from severe pain Morphine as needed  ACUTE KIDNEY INJURY/Renal Failure -follow chem 7 -follow UO -continue Foley Catheter-assess need -Avoid nephrotoxic agents Given Lokelma in ER for elevated K  HCPOA NICOLE notified of very poor situation  Corrin Parker, M.D.  [facility]  Medical Director [place] Director [place] Department

## 2018-12-29 NOTE — ED Notes (Signed)
Potassium 7.2 per lab- requested recollect to confirm

## 2018-12-29 NOTE — Progress Notes (Signed)
Patient's blood pressure refractory to IVFs, also noted to be brady. pts K 6.6, troponin 117. BP 90/66, HR 59 after approx 400 ml NS  Discussed case with critical care team Dr. Mortimer Fries. Will start Levophed gtt and transfer to step down unit for further management.

## 2018-12-30 DIAGNOSIS — M24561 Contracture, right knee: Secondary | ICD-10-CM

## 2018-12-30 DIAGNOSIS — R6520 Severe sepsis without septic shock: Secondary | ICD-10-CM

## 2018-12-30 DIAGNOSIS — I420 Dilated cardiomyopathy: Secondary | ICD-10-CM

## 2018-12-30 DIAGNOSIS — L89899 Pressure ulcer of other site, unspecified stage: Secondary | ICD-10-CM

## 2018-12-30 DIAGNOSIS — L89150 Pressure ulcer of sacral region, unstageable: Secondary | ICD-10-CM

## 2018-12-30 DIAGNOSIS — N19 Unspecified kidney failure: Secondary | ICD-10-CM

## 2018-12-30 DIAGNOSIS — I509 Heart failure, unspecified: Secondary | ICD-10-CM

## 2018-12-30 DIAGNOSIS — E875 Hyperkalemia: Secondary | ICD-10-CM

## 2018-12-30 DIAGNOSIS — N179 Acute kidney failure, unspecified: Secondary | ICD-10-CM

## 2018-12-30 DIAGNOSIS — L89159 Pressure ulcer of sacral region, unspecified stage: Secondary | ICD-10-CM

## 2018-12-30 DIAGNOSIS — E43 Unspecified severe protein-calorie malnutrition: Secondary | ICD-10-CM

## 2018-12-30 DIAGNOSIS — L97319 Non-pressure chronic ulcer of right ankle with unspecified severity: Secondary | ICD-10-CM

## 2018-12-30 DIAGNOSIS — L97229 Non-pressure chronic ulcer of left calf with unspecified severity: Secondary | ICD-10-CM

## 2018-12-30 DIAGNOSIS — N184 Chronic kidney disease, stage 4 (severe): Secondary | ICD-10-CM

## 2018-12-30 DIAGNOSIS — I959 Hypotension, unspecified: Secondary | ICD-10-CM

## 2018-12-30 LAB — GLUCOSE, CAPILLARY
Glucose-Capillary: 106 mg/dL — ABNORMAL HIGH (ref 70–99)
Glucose-Capillary: 118 mg/dL — ABNORMAL HIGH (ref 70–99)
Glucose-Capillary: 124 mg/dL — ABNORMAL HIGH (ref 70–99)
Glucose-Capillary: 136 mg/dL — ABNORMAL HIGH (ref 70–99)
Glucose-Capillary: 144 mg/dL — ABNORMAL HIGH (ref 70–99)
Glucose-Capillary: 72 mg/dL (ref 70–99)

## 2018-12-30 LAB — BASIC METABOLIC PANEL
Anion gap: 7 (ref 5–15)
Anion gap: 11 (ref 5–15)
BUN: 128 mg/dL — ABNORMAL HIGH (ref 8–23)
BUN: 120 mg/dL — ABNORMAL HIGH (ref 8–23)
CO2: 14 mmol/L — ABNORMAL LOW (ref 22–32)
CO2: 12 mmol/L — ABNORMAL LOW (ref 22–32)
Calcium: 8.3 mg/dL — ABNORMAL LOW (ref 8.9–10.3)
Calcium: 8.3 mg/dL — ABNORMAL LOW (ref 8.9–10.3)
Chloride: 127 mmol/L — ABNORMAL HIGH (ref 98–111)
Chloride: 124 mmol/L — ABNORMAL HIGH (ref 98–111)
Creatinine, Ser: 3.54 mg/dL — ABNORMAL HIGH (ref 0.61–1.24)
Creatinine, Ser: 3.47 mg/dL — ABNORMAL HIGH (ref 0.61–1.24)
GFR calc Af Amer: 18 mL/min — ABNORMAL LOW (ref >60)
GFR calc Af Amer: 18 mL/min — ABNORMAL LOW (ref >60)
GFR calc non Af Amer: 15 mL/min — ABNORMAL LOW (ref >60)
GFR calc non Af Amer: 16 mL/min — ABNORMAL LOW (ref >60)
Glucose, Bld: 89 mg/dL (ref 70–99)
Glucose, Bld: 117 mg/dL — ABNORMAL HIGH (ref 70–99)
Potassium: 6 mmol/L — ABNORMAL HIGH (ref 3.5–5.1)
Potassium: 5.6 mmol/L — ABNORMAL HIGH (ref 3.5–5.1)
Sodium: 148 mmol/L — ABNORMAL HIGH (ref 135–145)
Sodium: 147 mmol/L — ABNORMAL HIGH (ref 135–145)

## 2018-12-30 LAB — CBC
HCT: 32.3 % — ABNORMAL LOW (ref 39.0–52.0)
Hemoglobin: 10.2 g/dL — ABNORMAL LOW (ref 13.0–17.0)
MCH: 28.9 pg (ref 26.0–34.0)
MCHC: 31.6 g/dL (ref 30.0–36.0)
MCV: 91.5 fL (ref 80.0–100.0)
Platelets: 175 10*3/uL (ref 150–400)
RBC: 3.53 MIL/uL — ABNORMAL LOW (ref 4.22–5.81)
RDW: 18.6 % — ABNORMAL HIGH (ref 11.5–15.5)
WBC: 16.5 10*3/uL — ABNORMAL HIGH (ref 4.0–10.5)
nRBC: 0.1 % (ref 0.0–0.2)

## 2018-12-30 LAB — MAGNESIUM: Magnesium: 2.5 mg/dL — ABNORMAL HIGH (ref 1.7–2.4)

## 2018-12-30 LAB — VANCOMYCIN, RANDOM: Vancomycin Rm: 8

## 2018-12-30 MED ORDER — MORPHINE SULFATE (PF) 2 MG/ML IV SOLN
2.0000 mg | INTRAVENOUS | Status: DC | PRN
  Administered 2018-12-30 – 2019-01-01 (×13): 4 mg via INTRAVENOUS
  Filled 2018-12-30 (×13): qty 2

## 2018-12-30 MED ORDER — SODIUM BICARBONATE 8.4 % IV SOLN
INTRAVENOUS | Status: DC
  Administered 2018-12-30 (×2): via INTRAVENOUS
  Filled 2018-12-30 (×5): qty 100

## 2018-12-30 MED ORDER — VANCOMYCIN HCL 500 MG IV SOLR
500.0000 mg | INTRAVENOUS | Status: DC
  Administered 2018-12-30 – 2019-01-01 (×2): 500 mg via INTRAVENOUS
  Filled 2018-12-30 (×2): qty 500

## 2018-12-30 MED ORDER — DEXTROSE 5 % IV SOLN
INTRAVENOUS | Status: DC
  Administered 2018-12-30: 04:00:00 via INTRAVENOUS

## 2018-12-30 MED ORDER — LEVOTHYROXINE SODIUM 100 MCG/5ML IV SOLN
55.0000 ug | Freq: Every day | INTRAVENOUS | Status: DC
  Administered 2018-12-30 – 2019-01-01 (×3): 55 ug via INTRAVENOUS
  Filled 2018-12-30 (×3): qty 5

## 2018-12-30 MED ORDER — INSULIN ASPART 100 UNIT/ML IV SOLN
10.0000 [IU] | Freq: Once | INTRAVENOUS | Status: AC
  Administered 2018-12-30: 05:00:00 10 [IU] via INTRAVENOUS
  Filled 2018-12-30: qty 0.1

## 2018-12-30 MED ORDER — MORPHINE SULFATE (PF) 4 MG/ML IV SOLN
4.0000 mg | Freq: Once | INTRAVENOUS | Status: AC
  Administered 2018-12-30: 4 mg via INTRAVENOUS

## 2018-12-30 MED ORDER — ZINC OXIDE 40 % EX OINT
TOPICAL_OINTMENT | Freq: Three times a day (TID) | CUTANEOUS | Status: DC
  Administered 2018-12-30: 11:00:00 via TOPICAL
  Filled 2018-12-30: qty 113

## 2018-12-30 MED ORDER — ZINC OXIDE 40 % EX OINT
TOPICAL_OINTMENT | Freq: Every day | CUTANEOUS | Status: DC
  Administered 2018-12-31 – 2019-01-01 (×2): via TOPICAL
  Filled 2018-12-30: qty 113

## 2018-12-30 MED ORDER — MORPHINE SULFATE (PF) 4 MG/ML IV SOLN
INTRAVENOUS | Status: AC
  Filled 2018-12-30: qty 1

## 2018-12-30 MED ORDER — MORPHINE SULFATE (PF) 2 MG/ML IV SOLN: 2.0000 mg | INTRAVENOUS | Status: DC | PRN

## 2018-12-30 MED ORDER — DEXTROSE 50 % IV SOLN
1.0000 | Freq: Once | INTRAVENOUS | Status: AC
  Administered 2018-12-30: 05:00:00 50 mL via INTRAVENOUS
  Filled 2018-12-30: qty 50

## 2018-12-30 NOTE — Consult Note (Signed)
[facility] Nurse wound consult note Patient receiving care in [facility]. Assisted with turning and assessment by primary RN and a second RN.  Patient was premedicated with IV med before assessment began. Reason for Consult: PIs to sacrum and BLE necrosis Wound type: The RLE has a pre-tibial sinus tract that constantly has purulent drainage ozzing out.  The RLE has multiple partial and full thickness wounds, none of which give the appearance of any success of healing.  The LLE has scattered wounds, however, these are more superficial and none of them have purulent drainage.  Both legs below the knees are discolored and darkened.  I could not palpate a dorsalis pedis pulse on either foot.  The coccyx has an opening that measures 0.2 cm x 0.2 cm and is 100% pink and clean.  It does not tract anywhere.  The right ischial tuberosity has an unstageable wound that is brown/yellow.  The entire posterior and lateral sides of the scrotum have denuded skin likely from MASD from incontinence.  There is also an unstageable brown wound along the patient's left lateral scrotal surface.  After a conversation with the ICU Attending, I am pursuing conservative therapy for all wounds.  The likelihood that the ischial tuberosity or scrotal wounds, or the RLE wounds will heal is virtually non-existent.    Dressing procedure/placement/frequency: For the buttock and scrotal wounds, Desitin 40% TID and prn covered with non-adherent dressing such as vaseline or xeroform gauzes.  For the legs: Place multiple vaseline gauzes over all wounds on BLE.  Cover any wounds that ooze with ABD pad.  Secure with kerlex spiral wrapped up the legs. Change daily.  Should the decision be made to move away from supportive topical wound care, towards more aggressive care of the wounds, please consult surgery for the ischial tuberosity, scrotal, and RLE wounds.  Thank you for the consult.  Discussed plan of care with the patient and bedside nurse.  [place] nurse  will not follow at this time.  Please re-consult the [place] team if needed.  Val Riles, RN, MSN, CWOCN, CNS-BC, pager [phone]

## 2018-12-30 NOTE — Consult Note (Signed)
[facility] Consult Note  MRN : [id number]  Alan Marshall is a 83 y.o. (Jan 26, 1936) male who presents with chief complaint of  Chief Complaint  Patient presents with  . Leg Pain  .  History of Present Illness: I am asked to see the patient by E. Ouma, NP for BLE ulcerations.  The patient was scheduled to be seen in our office this week for evaluation for peripheral arterial disease with nonhealing ulcerations.  He has apparently been recommended to have amputations bilaterally for some time but has previously refused.  Patient can provide no history and this is obtained from the nursing staff in the previous medical record.  He has altered mental status and is moaning in pain.  He apparently has a fixed contracture of the right knee and has not been ambulatory for some time.  Both calves have extensive ulcerations as documented in the admission history and physical pictures.  He also has sacral ulcers and scrotal ulcers from pressure.  He is cachectic and clearly malnourished.  He is in clear agony and moaning in pain even with getting pain medications. These ulcerations have been present for months.  No improvement over time with gradual worsening. No fever or chills.  Low BP on admission and is still on pressors  Current Facility-Administered Medications  Medication Dose Route Frequency Provider Last Rate Last Dose  . ceFEPIme (MAXIPIME) 2 g in sodium chloride 0.9 % 100 mL IVPB  2 g Intravenous Q24H Duncan, Asajah R, RPH      . dorzolamide (TRUSOPT) 2 % ophthalmic solution 1 drop  1 drop Both Eyes BID Lang Snow, NP   1 drop at 12/18/2018 1943  . hydrocerin (EUCERIN) cream 1 application  1 application Topical BID Lang Snow, NP   1 application at 40/98/11 1943  . latanoprost (XALATAN) 0.005 % ophthalmic solution 1 drop  1 drop Both Eyes QHS Lang Snow, NP   1 drop at 01/03/2019 2302  . levothyroxine (SYNTHROID, LEVOTHROID)  injection 55 mcg  55 mcg Intravenous Daily Wilhelmina Mcardle, MD      . liver oil-zinc oxide (DESITIN) 40 % ointment   Topical TID Bettey Costa, MD      . morphine 2 MG/ML injection 2-4 mg  2-4 mg Intravenous Q1H PRN Wilhelmina Mcardle, MD      . morphine 4 MG/ML injection           . norepinephrine (LEVOPHED) 4mg  in 223mL premix infusion  0-20 mcg/min Intravenous Titrated Wilhelmina Mcardle, MD 26.3 mL/hr at 12/30/18 0552 7 mcg/min at 12/30/18 0552  . simethicone (MYLICON) chewable tablet 80 mg  80 mg Oral Q6H PRN Lang Snow, NP      . sodium bicarbonate 100 mEq in dextrose 5 % 1,000 mL infusion   Intravenous Continuous Wilhelmina Mcardle, MD      . vancomycin (VANCOCIN) 500 mg in sodium chloride 0.9 % 100 mL IVPB  500 mg Intravenous Q48H Tawnya Crook, [facility]        Past Medical History:  Diagnosis Date  . Anemia   . CKD (chronic kidney disease), stage IV ([place])   . Depression   . GERD (gastroesophageal reflux disease)   . Glaucoma   . Gout   . HFrEF (heart failure with reduced ejection fraction) ([place])    a. 11/2017 Echo: EF <20, diff HK. mild to mod AI. Asc Ao 24mm (mod dil). Mod MR. Mod dil LA/RV/RA. PASP  61mmHg. Mod to large circumferential pericardial effusion w/o tamponade; b. 12/05/2017 Echo: EF 15-20%, mod AI, mild to mod MR, mod TR, PASP 54mmHg, mod pericardial effusion w/o tamponade.  . Hypertension   . Hypothyroidism   . NICM (nonischemic cardiomyopathy) ([place])    a. S/p prior AICD->extracted in 2015 2/2 MSSA bacteremia; b. s/p Subcutaneous ICD; c. 11/2017 Echo: EF <20%; c. 12/05/2017 Echo: EF 15-20%.  . Pericardial effusion    a. 11/2017 Echo: Mod to large circumferential pericardial effusion w/o effusive-constrictive changes or tamponade physiology; b. 12/05/2017 Echo: EF 15-20%, mod pericardial effusion w/o hemodynamic compromise.  [name] Kitchen Permanent Atrial Fibrillation/Flutter ([place])    a. CHA2DS2VASc = 4-->No OAC 2/2 dementia.  . Recurrent left pleural effusion    a. 11/2017 s/p  thoracentesis.  Path neg.  . Vitamin D deficiency     Past Surgical History:  Procedure Laterality Date  . INSERT / REPLACE / REMOVE PACEMAKER    . KNEE SURGERY Right     Social History Social History   Tobacco Use  . Smoking status: Never Smoker  . Smokeless tobacco: Never Used  Substance Use Topics  . Alcohol use: Not Currently  . Drug use: Never    Family History Family History  Problem Relation Age of Onset  . Hypertension Father   No reported history of bleeding disorders, clotting disorders, or aneurysms but patient unable to provide further history.  No Known Allergies   REVIEW OF SYSTEMS (Negative unless checked) Unable to obtain due to obtain due to altered mental status  Physical Examination  Vitals:   12/30/18 0757 12/30/18 0800 12/30/18 0900 12/30/18 1000  BP:  (!) 86/53 (!) 91/51 98/74  Pulse:      Resp: 11 (!) 9 12 12   Temp: (!) 94.3 F (34.6 C)     TempSrc: Axillary     SpO2: 100%     Weight:      Height:       Body mass index is 16.27 kg/m. Gen: Cachectic, debilitated, elderly male and in clear pain Head: [place]/AT, + temporalis wasting.  Ear/Nose/Throat: Hearing difficult to assess, nares w/o erythema or drainage, oropharynx w/o Erythema/Exudate Eyes: Sclera non-icteric, conjunctiva clear Neck: Trachea midline.  No JVD.  Pulmonary:  Good air movement, somewhat labored respirations Cardiac: irregular and tachycardic Vascular:  Vessel Right Left  Radial Palpable Palpable                          PT Not Palpable Not Palpable  DP Not Palpable Not Palpable   Gastrointestinal: soft. No guarding/reflex.  Musculoskeletal: Right knee has a fixed contracture that I cannot move.  There are extensive ulcerations on both calves with weeping and drainage.  No significant swelling. Sluggish capillary refill to both feet.  Neurologic: Difficult to assess with patient's mental status.  Right leg has a fixed contracture and really unable to move it.   Responds only really to pain not to command. Psychiatric: Judgment and insight are poor.  Patient unable to converse regularly Dermatologic: Multiple open wounds including on his scrotum, a large sacral decubitus, and bilateral lower extremity ulcerations as described above      CBC Lab Results  Component Value Date   WBC 16.5 (H) 12/30/2018   HGB 10.2 (L) 12/30/2018   HCT 32.3 (L) 12/30/2018   MCV 91.5 12/30/2018   PLT 175 12/30/2018    BMET    Component Value Date/Time   NA 147 (H) 12/30/2018 0800  NA 141 05/12/2018 1551   K 5.6 (H) 12/30/2018 0800   CL 124 (H) 12/30/2018 0800   CO2 12 (L) 12/30/2018 0800   GLUCOSE 117 (H) 12/30/2018 0800   BUN 120 (H) 12/30/2018 0800   BUN 51 (H) 05/12/2018 1551   CREATININE 3.47 (H) 12/30/2018 0800   CALCIUM 8.3 (L) 12/30/2018 0800   GFRNONAA 16 (L) 12/30/2018 0800   GFRAA 18 (L) 12/30/2018 0800   Estimated Creatinine Clearance: 13.1 mL/min (A) (by C-G formula based on SCr of 3.47 mg/dL (H)).  COAG Lab Results  Component Value Date   INR 1.4 (H) 01/03/2019   INR 1.17 04/25/2018   INR 1.54 11/15/2017    Radiology Dg Chest Port 1 View  Result Date: 12/23/2018 CLINICAL DATA:  Sepsis. EXAM: PORTABLE CHEST 1 VIEW COMPARISON:  Radiograph December 25, 2017. FINDINGS: Stable cardiomegaly. Atherosclerosis of thoracic aorta is noted. No pneumothorax is noted. Right lung is clear. Mild left basilar atelectasis or infiltrate and possible associated effusion may be present. Bony thorax unremarkable. IMPRESSION: Mild left basilar opacity as described above. Aortic Atherosclerosis (ICD10-I70.0). Electronically Signed   By: Marijo Conception M.D.   On: 12/10/2018 10:23      Assessment/Plan 1.  Extensive bilateral lower extremity ulcerations with suspected severe peripheral arterial disease.  The right leg is clearly nonsalvageable with a contracted knee that is completely nonfunctional and the only option from our standpoint would be an above-knee  amputation.  The left leg has an extremely high risk of amputation but we would be willing to attempt limb salvage on the left side.  If limb salvage was desired by the family, we would plan a left lower extremity angiogram if his renal function improves.  An angiogram at this point would likely tip him into renal failure and dialysis dependence.  It would be reasonable to consider an amputation above the knee on the left side as well and avoid an angiogram with contrast.  Very difficult situation with multiple other ongoing issues as well. 2.  Sepsis with hypotension requiring pressors.  Still on Levophed.  Multiple possible sources including the lower extremity ulcers, the sacral and scrotal ulcers, and other possible issues. 3.  Acute renal failure.  Creatinine greater than 3.  If limb salvage is desired on the left leg, we will try to limit contrast as much as possible but certainly he would be at high risk for renal failure. 4.  Severe protein calorie malnutrition.  BMI is only 16.  He is emaciated and obviously unwell 5.  Heart failure with markedly reduced ejection fraction.  Patient is clearly high risk for surgery but at some point amputation will be required if aggressive care is desired by the family.   Leotis Pain, MD  12/30/2018 10:43 AM    This note was created with Dragon medical transcription system.  Any error is purely unintentional

## 2018-12-30 NOTE — Progress Notes (Signed)
Patient was evaluated several times over the course of the day.  Upon initial evaluation this morning he was moaning in extreme pain during evaluation and dressing change of sacral and scrotal pressure ulcers.  He also has bilateral lower extremity pain due to vascular insufficiency.  He has been encephalopathic and is poorly oriented.  Vitals:   12/30/18 1152 12/30/18 1200 12/30/18 1300 12/30/18 1400  BP:  (!) 70/55 (!) 76/54 (!) 78/53  Pulse:   (!) 58 (!) 105  Resp: '11 11 15 11  '$ Temp: (!) 97.3 F (36.3 C)     TempSrc: Axillary     SpO2:   92% 91%  Weight:      Height:      Room air  Elderly, somewhat frail-appearing.  No respiratory distress.  Poorly oriented.  Intermittently crying out confused and in pain  HEENT: Temporal wasting.  NCAT, sclerae white Neck: Supple, no JVD Chest: Clear anteriorly Cardiac: Regular, no M Abdomen: Soft, NT, + BS Extremities: Extensive bilateral LE ulcerations Neuro: Cranial nerves intact, no focal deficits noted, diffusely weak  BMP Latest Ref Rng & Units 12/30/2018 12/30/2018 12/22/2018  Glucose 70 - 99 mg/dL 117(H) 89 91  BUN 8 - 23 mg/dL 120(H) 128(H) -  Creatinine 0.61 - 1.24 mg/dL 3.47(H) 3.54(H) -  BUN/Creat Ratio 10 - 24 - - -  Sodium 135 - 145 mmol/L 147(H) 148(H) -  Potassium 3.5 - 5.1 mmol/L 5.6(H) 6.0(H) -  Chloride 98 - 111 mmol/L 124(H) 127(H) -  CO2 22 - 32 mmol/L 12(L) 14(L) -  Calcium 8.9 - 10.3 mg/dL 8.3(L) 8.3(L) -    CBC Latest Ref Rng & Units 12/30/2018 12/21/2018 05/12/2018  WBC 4.0 - 10.5 K/uL 16.5(H) 11.4(H) 7.9  Hemoglobin 13.0 - 17.0 g/dL 10.2(L) 9.9(L) 9.5(L)  Hematocrit 39.0 - 52.0 % 32.3(L) 30.9(L) 28.7(L)  Platelets 150 - 400 K/uL 175 208 174   Hepatic Function Latest Ref Rng & Units 12/23/2018 04/25/2018 11/17/2017  Total Protein 6.5 - 8.1 g/dL 6.3(L) 7.3 -  Albumin 3.5 - 5.0 g/dL 2.2(L) 2.7(L) 3.0(L)  AST 15 - 41 U/L 24 16 -  ALT 0 - 44 U/L 12 8 -  Alk Phosphatase 38 - 126 U/L 65 79 -  Total Bilirubin 0.3 - 1.2  mg/dL 0.9 0.5 -   CXR 7/21: Severe cardiomegaly, LLL atx  IMPRESSION: SNF resident with profound baseline impairment but reportedly no history of dementia  1)  Severe sepsis due to multiple infected pressure ulcers and LE vascular insuff ulcers 2) Severe dilated CM (LVEF < 20% in 04/2018) 3) AKI, oliguric 4) CKD stage 4 - not a candidate for HD 5) history of hypothyroidism 6) Severe pain due to pressure ulcers 7) Acute encephalopathy  DISCUSSION: I had an extended goals of care discussion with patient's daughter Alan Marshall (who is legally designated as Economist) and son Alan Marshall (who spoke with [place] over the phone).  I updated them on Alan Marshall's critical condition and poor prognosis.  We discussed his values and priorities.  I emphasized the limited number of options that would make a significant impact on his outcome here.  I clarified that he is a very poor candidate for aggressive intervention such as HD, ACLS/CPR, intubation/mechanical ventilation.  Although these interventions might extend his life, they have no hope of restoring him to a quality of life that would be deemed acceptable.  After 30 minutes of discussion, we all agreed to the plan as follows.  PLAN/REC: -DNR/DNI -No HD under any  circumstances -At least at this time, he is not a candidate for amputations and is not likely to ever be such -Emphasis on relieving suffering and preserving dignity.  Continue morphine as needed for pain -Continue current antibiotics -Continue wound care (dressing changes etc.) as long as they can be performed in a way that is not excessively painful.  Frequency changed from tid to daily -Continue intravenous fluids -Low-dose vasopressors to maintain blood pressure (which tends to drop with morphine)  CCM time: 45 mins The above time includes time spent in consultation with patient and/or family members and reviewing care plan on multidisciplinary rounds  Merton Border, MD PCCM service Mobile  [phone] Pager [phone] 12/30/2018 5:27 PM

## 2018-12-30 NOTE — Progress Notes (Signed)
Called patient's daughter Elmyra Ricks- updated dated her on patient's condition- she stated that she would be here this afternoon- Dr. Alva Garnet stated to me that he would like to speak to her in person to discuss goals of care. Elmyra Ricks stated that she had no idea that patient had chronic wounds- she stated that [facility] facility never mentioned any skin issues to her.  She stated she is very concerned over the type of care that her father has received at this facility.

## 2018-12-30 NOTE — Progress Notes (Signed)
[place] at [place] NAME: Alan Marshall    MR#:  [id number]  DATE OF BIRTH:  Jan 22, 1936  SUBJECTIVE:   Patient complaining of pain.  Patient presents to the emergency room due to bilateral ulcers.  Patient evaluated by vascular surgery today. Patient has refused amputation in the past.  Blood pressure still low on pressors.  REVIEW OF SYSTEMS:    Review of Systems  Constitutional: Negative for fever, chills weight loss HENT: Negative for ear pain, nosebleeds, congestion, facial swelling, rhinorrhea, neck pain, neck stiffness and ear discharge.   Respiratory: Negative for cough, shortness of breath, wheezing  Cardiovascular: Negative for chest pain, palpitations and leg swelling.  Gastrointestinal: Negative for heartburn, abdominal pain, vomiting, diarrhea or consitpation Genitourinary: Negative for dysuria, urgency, frequency, hematuria Musculoskeletal: Positive contractures Neurological: Negative for dizziness, seizures, syncope, focal weakness,  numbness and headaches.  Hematological: Does not bruise/bleed easily.  Psychiatric/Behavioral: Negative for hallucinations, confusion, dysphoric mood  Skin: Patient has contracture of right leg.  He has sacral ulcers and scrotal ulcers from pressure as well as extensive ulcerations on lower extremities  Tolerating Diet: yes      DRUG ALLERGIES:  No Known Allergies  VITALS:  Blood pressure (!) 113/98, pulse 66, temperature (!) 97.3 F (36.3 C), temperature source Axillary, resp. rate 11, height 6' (1.829 m), weight 54.4 kg, SpO2 100 %.  PHYSICAL EXAMINATION:  Constitutional: Appears thin frail cachectic HENT: Normocephalic. [name] Kitchen Oropharynx is clear and moist.  Eyes: Conjunctivae and EOM are normal. PERRLA, no scleral icterus.  Neck: Normal ROM. Neck supple. No JVD. No tracheal deviation. CVS: RRR, S1/S2 +, no murmurs, no gallops, no carotid bruit.  Pulmonary: Effort and breath sounds  normal, no stridor, rhonchi, wheezes, rales.  Abdominal: Soft. BS +,  no distension, tenderness, rebound or guarding.  Musculoskeletal: Right leg with contractures    neuro: Alert. CN 2-12 grossly intact. No focal deficits. Skin (PER WOUND CARE NOTE)    The RLE has a pre-tibial sinus tract that constantly has purulent drainage ozzing out.  The RLE has multiple partial and full thickness wounds, The LLE has scattered wounds, however, these are more superficial and none of them have purulent drainage.  .  The coccyx has an opening that measures 0.2 cm x 0.2 cm and is 100% pink and clean.   The right ischial tuberosity has an unstageable wound that is brown/yellow.  The entire posterior and lateral sides of the scrotum have denuded skin likely from MASD from incontinence.  There is also an unstageable brown wound along the patient's left lateral scrotal surface.        LABORATORY PANEL:   CBC Recent Labs  Lab 12/30/18 0302  WBC 16.5*  HGB 10.2*  HCT 32.3*  PLT 175   ------------------------------------------------------------------------------------------------------------------  Chemistries  Recent Labs  Lab 12/20/2018 1038  12/30/18 0302 12/30/18 0800  NA 145   < > 148* 147*  K 6.6*   < > 6.0* 5.6*  CL 125*   < > 127* 124*  CO2 11*   < > 14* 12*  GLUCOSE 85   < > 89 117*  BUN 134*   < > 128* 120*  CREATININE 3.96*   < > 3.54* 3.47*  CALCIUM 7.9*   < > 8.3* 8.3*  MG  --    < > 2.5*  --   AST 24  --   --   --   ALT 12  --   --   --  ALKPHOS 65  --   --   --   BILITOT 0.9  --   --   --    < > = values in this interval not displayed.   ------------------------------------------------------------------------------------------------------------------  Cardiac Enzymes No results for input(s): TROPONINI in the last 168 hours. ------------------------------------------------------------------------------------------------------------------  RADIOLOGY:  Dg Chest Port 1  View  Result Date: 12/13/2018 CLINICAL DATA:  Sepsis. EXAM: PORTABLE CHEST 1 VIEW COMPARISON:  Radiograph December 25, 2017. FINDINGS: Stable cardiomegaly. Atherosclerosis of thoracic aorta is noted. No pneumothorax is noted. Right lung is clear. Mild left basilar atelectasis or infiltrate and possible associated effusion may be present. Bony thorax unremarkable. IMPRESSION: Mild left basilar opacity as described above. Aortic Atherosclerosis (ICD10-I70.0). Electronically Signed   By: Marijo Conception M.D.   On: 12/27/2018 10:23     ASSESSMENT AND PLAN:   83 year old male with a severe protein malnutrition, chronic kidney disease stage IV, chronic systolic heart failure, PAF and chronic sacral and lower extremity wounds who presents to the emergency room due to ulcerations of lower extremities.  1.  Septic shock on pressors due to extensive bilateral lower extremity ulcerations and suspected PAD: Likelihood that these wounds will heal is essentially nonexistent. Dr. Jamal Collin did speak with patient's family regarding this. Conservative approach recommended. For now continue wound care recommendations: For the buttock and scrotal wounds, Desitin 40% TID and prn covered with non-adherent dressing such as vaseline or xeroform gauzes.  For the legs: Place multiple vaseline gauzes over all wounds on BLE.  Cover any wounds that ooze with ABD pad.  Secure with kerlex spiral wrapped up the legs. Change daily.  Vascular surgery has seen patient. The right leg is clearly nonsalvageable with a contracted knee that is completely nonfunctional and the only option from our standpoint would be an above-knee amputation.  The left leg has an extremely high risk of amputation. If limb salvage was desired by the family, plan a left lower extremity angiogram if his renal function improves.  An angiogram at this point would likely tip him into renal failure and dialysis dependence.  It would be reasonable to consider an  amputation above the knee on the left side as well and avoid an angiogram with contrast.  2.  Acute encephalopathy due to septic shock. 3.  Acute on chronic kidney disease stage IV: Patient will need nephrology consultation if family wants more aggressive care Holding nephrotoxic medications   4.  Chronic systolic heart failure without signs of exacerbation  5.  Hyperkalemia: Patient received [place]ma  6.  Severe protein calorie malnutrition: Dietary consult  Patient with overall very poor prognosis.  Unlikely that these wounds will heal.  This will be the source of ongoing infections. Intensivist to meet with family today. Recommend DNR and hospice/plan of care services.  Management plans discussed with the patient and he is in agreement.  CODE STATUS: DNR  TOTAL TIME TAKING CARE OF THIS PATIENT: 30 minutes.   D/w dr simonds  POSSIBLE D/C ??, DEPENDING ON CLINICAL CONDITION.   Bettey Costa M.D on 12/30/2018 at 12:08 PM  Between 7am to 6pm - Pager - [phone] After 6pm go to [email or web address] - password [email or web address] [facility]  Office  [phone]  [name]; Alvester Morin, MD  Note: This dictation was prepared with Dragon dictation along with smaller phrase technology. Any transcriptional errors that result from this process are unintentional.

## 2018-12-30 NOTE — Progress Notes (Signed)
   12/30/18 1800  Clinical Encounter Type  Visited With Family  Visit Type Initial;Spiritual support  Referral From Nurse  Consult/Referral To Chaplain  Spiritual Encounters  Spiritual Needs Prayer;Emotional  Stress Factors  Family Stress Factors Health changes;Major life changes  Chaplain was page for patient family. Chaplain meet daughter and she expressed her concerns for making the correct decisions for her father the patient. She wants to carry the patient wishes. Daughter shared the support of her other siblings as well. Daughter husband just lost his father on 12-26-18. Chaplain prayed with daughter for peace and comfort and walked her to the [place] entrance.

## 2018-12-31 DIAGNOSIS — R52 Pain, unspecified: Secondary | ICD-10-CM

## 2018-12-31 DIAGNOSIS — I70239 Atherosclerosis of native arteries of right leg with ulceration of unspecified site: Secondary | ICD-10-CM

## 2018-12-31 DIAGNOSIS — I70249 Atherosclerosis of native arteries of left leg with ulceration of unspecified site: Secondary | ICD-10-CM

## 2018-12-31 DIAGNOSIS — I739 Peripheral vascular disease, unspecified: Secondary | ICD-10-CM

## 2018-12-31 LAB — CBC
HCT: 30.5 % — ABNORMAL LOW (ref 39.0–52.0)
Hemoglobin: 9.7 g/dL — ABNORMAL LOW (ref 13.0–17.0)
MCH: 28.8 pg (ref 26.0–34.0)
MCHC: 31.8 g/dL (ref 30.0–36.0)
MCV: 90.5 fL (ref 80.0–100.0)
Platelets: 199 10*3/uL (ref 150–400)
RBC: 3.37 MIL/uL — ABNORMAL LOW (ref 4.22–5.81)
RDW: 18.7 % — ABNORMAL HIGH (ref 11.5–15.5)
WBC: 13.9 10*3/uL — ABNORMAL HIGH (ref 4.0–10.5)
nRBC: 0.5 % — ABNORMAL HIGH (ref 0.0–0.2)

## 2018-12-31 LAB — COMPREHENSIVE METABOLIC PANEL
ALT: 16 U/L (ref 0–44)
AST: 28 U/L (ref 15–41)
Albumin: 2.4 g/dL — ABNORMAL LOW (ref 3.5–5.0)
Alkaline Phosphatase: 70 U/L (ref 38–126)
Anion gap: 7 (ref 5–15)
BUN: 117 mg/dL — ABNORMAL HIGH (ref 8–23)
CO2: 16 mmol/L — ABNORMAL LOW (ref 22–32)
Calcium: 7.9 mg/dL — ABNORMAL LOW (ref 8.9–10.3)
Chloride: 126 mmol/L — ABNORMAL HIGH (ref 98–111)
Creatinine, Ser: 3.17 mg/dL — ABNORMAL HIGH (ref 0.61–1.24)
GFR calc Af Amer: 20 mL/min — ABNORMAL LOW (ref >60)
GFR calc non Af Amer: 18 mL/min — ABNORMAL LOW (ref >60)
Glucose, Bld: 157 mg/dL — ABNORMAL HIGH (ref 70–99)
Potassium: 5.6 mmol/L — ABNORMAL HIGH (ref 3.5–5.1)
Sodium: 149 mmol/L — ABNORMAL HIGH (ref 135–145)
Total Bilirubin: 0.9 mg/dL (ref 0.3–1.2)
Total Protein: 6.7 g/dL (ref 6.5–8.1)

## 2018-12-31 LAB — GLUCOSE, CAPILLARY: Glucose-Capillary: 141 mg/dL — ABNORMAL HIGH (ref 70–99)

## 2018-12-31 LAB — URINE CULTURE: Culture: 40000 — AB

## 2018-12-31 MED ORDER — SODIUM BICARBONATE 8.4 % IV SOLN
INTRAVENOUS | Status: DC
  Administered 2018-12-31 – 2019-01-01 (×3): via INTRAVENOUS
  Filled 2018-12-31 (×7): qty 50
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
## 2018-12-31 NOTE — Progress Notes (Signed)
[place] at [place] NAME: Alan Marshall    MR#:  [id number]  DATE OF BIRTH:  [birth year]  SUBJECTIVE:   Family has decided not to pursue aggressive management.  Patient with confusion this morning.[name] Kitchen  REVIEW OF SYSTEMS:  Unable to obtain review of systems due to confusion and mental status. Tolerating Diet: yes      DRUG ALLERGIES:  No Known Allergies  VITALS:  Blood pressure 95/64, pulse (!) 105, temperature 98.6 F (37 C), temperature source Axillary, resp. rate 14, height 6' (1.829 m), weight 54.4 kg, SpO2 97 %.  PHYSICAL EXAMINATION:  Constitutional: Appears thin frail cachectic HENT: Normocephalic. [name] Kitchen Oropharynx is clear and moist.  Eyes: Conjunctivae and EOM are normal. PERRLA, no scleral icterus.  Neck: Normal ROM. Neck supple. No JVD. No tracheal deviation. CVS: RRR, S1/S2 +, no murmurs, no gallops, no carotid bruit.  Pulmonary: Effort and breath sounds normal, no stridor, rhonchi, wheezes, rales.  Abdominal: Soft. BS +,  no distension, tenderness, rebound or guarding.  Musculoskeletal: Right leg with contractures    neuro: Alert.  Confused skin (PER WOUND CARE NOTE)    The RLE has a pre-tibial sinus tract that constantly has purulent drainage ozzing out.  The RLE has multiple partial and full thickness wounds, The LLE has scattered wounds, however, these are more superficial and none of them have purulent drainage.  .  The coccyx has an opening that measures 0.2 cm x 0.2 cm and is 100% pink and clean.   The right ischial tuberosity has an unstageable wound that is brown/yellow.  The entire posterior and lateral sides of the scrotum have denuded skin likely from MASD from incontinence.  There is also an unstageable brown wound along the patient's left lateral scrotal surface.        LABORATORY PANEL:   CBC Recent Labs  Lab 12/31/18 0436  WBC 13.9*  HGB 9.7*  HCT 30.5*  PLT 199    ------------------------------------------------------------------------------------------------------------------  Chemistries  Recent Labs  Lab 12/30/18 0302  12/31/18 0436  NA 148*   < > 149*  K 6.0*   < > 5.6*  CL 127*   < > 126*  CO2 14*   < > 16*  GLUCOSE 89   < > 157*  BUN 128*   < > 117*  CREATININE 3.54*   < > 3.17*  CALCIUM 8.3*   < > 7.9*  MG 2.5*  --   --   AST  --   --  28  ALT  --   --  16  ALKPHOS  --   --  70  BILITOT  --   --  0.9   < > = values in this interval not displayed.   ------------------------------------------------------------------------------------------------------------------  Cardiac Enzymes No results for input(s): TROPONINI in the last 168 hours. ------------------------------------------------------------------------------------------------------------------  RADIOLOGY:  No results found.   ASSESSMENT AND PLAN:   83 year old male with a severe protein malnutrition, chronic kidney disease stage IV, chronic systolic heart failure, PAF and chronic sacral and lower extremity wounds who presents to the emergency room due to ulcerations of lower extremities.  1.  Septic shock on pressors due to extensive bilateral lower extremity ulcerations and suspected PAD: Likelihood that these wounds will heal is essentially nonexistent. Family would like a more conservative approach.  Continue wound care as per recommendations by wound care nurse.   2.  Acute encephalopathy due to septic shock. 3.  Acute on chronic kidney  disease stage IV: Family does not want to pursue aggressive management.  4.  Chronic systolic heart failure without signs of exacerbation  5.  Hyperkalemia: Patient received Lokelma  6.  Severe protein calorie malnutrition:    Patient with overall very poor prognosis.  Unlikely that these wounds will heal.  Patient with overall very poor prognosis.   Consider hospice care   Discussed with Dr. Alva Garnet  CODE STATUS:  DNR  TOTAL TIME TAKING CARE OF THIS PATIENT: 20 minutes.   D/w dr simonds  POSSIBLE D/C ??, DEPENDING ON CLINICAL CONDITION.   Bettey Costa M.D on 12/31/2018 at 12:24 PM  Between 7am to 6pm - Pager - [phone] After 6pm go to [email or web address] - password [email or web address] [place] [facility]  Office  [phone]  [name]; Alvester Morin, MD  Note: This dictation was prepared with Dragon dictation along with smaller phrase technology. Any transcriptional errors that result from this process are unintentional.

## 2018-12-31 NOTE — Progress Notes (Signed)
Ch f/u with pt daughter via telephone. Pt shared that she was able to received consultation from the intensivist regarding the best way to move forward with the care for the pt. Pt is more verbal as reported by daughter and c/o pain in his leg. Ch allowed space for pt daughter to lament about the trajectory of her dad being placed in ICU. Pt daughter feels guilty for not having awareness of how bad her father's health had decline but also shard that her dad chose to move to [place] from [place] a few years ago. Ch helped pt daughter to identify her strengths and acknowledge how challenging the current situation is for both her and the pt. Ch educated the pt on the difference between DNR and Full Code to help reassure her that the measures that the care team are implementing would best suit her father considering his current health status and need of pain management. Pt was thankful for the clarity as she expressed that she is the HPOA and has to care for both her immediate and extended family members. Ch helped the pt daughter to be at peace and know that she has done the best that she could do with the information that she was given. Ch allowed pt daughter to spend time with him and provided words of encouragement. Pt daughter appreciated the call.    F/U recommended to support the pt (when alert) and family    12/31/18 1700  Clinical Encounter Type  Visited With Family  Visit Type Follow-up;Psychological support;Spiritual support;Social support;Critical Care  Referral From Chaplain  Consult/Referral To Chaplain  Spiritual Encounters  Spiritual Needs Emotional;Grief support  Stress Factors  Patient Stress Factors Exhausted;Health changes;Major life changes;Loss of control  Family Stress Factors Loss of control;Major life changes;Exhausted

## 2018-12-31 NOTE — Progress Notes (Signed)
Remains intermittently agitated with the appearance of intermittent pain or discomfort.  He is verbalizing incoherently and nonsensically.  He is poorly oriented.   Vitals:   12/31/18 0800 12/31/18 0900 12/31/18 0915 12/31/18 0930  BP: (!) 84/55 100/78  95/64  Pulse:      Resp: '11 16 11 14  '$ Temp:      TempSrc:      SpO2:      Weight:      Height:      Room air  Elderly, very frail-appearing.   No overt respiratory distress.   Poorly oriented.   Intermittently crying out confused and in pain HEENT: Temporal wasting.  NCAT, sclerae white Neck: Supple, no JVD noted Chest: Clear anteriorly Cardiac: Regular, no M Abdomen: Soft, NT, + BS Extremities: RLE in contracted position. Extensive bilateral LE ulcerations with drainage soaking through gauze dressings Neuro: Cranial nerves intact, no focal deficits noted, diffusely weak  BMP Latest Ref Rng & Units 12/31/2018 12/30/2018 12/30/2018  Glucose 70 - 99 mg/dL 157(H) 117(H) 89  BUN 8 - 23 mg/dL 117(H) 120(H) 128(H)  Creatinine 0.61 - 1.24 mg/dL 3.17(H) 3.47(H) 3.54(H)  BUN/Creat Ratio 10 - 24 - - -  Sodium 135 - 145 mmol/L 149(H) 147(H) 148(H)  Potassium 3.5 - 5.1 mmol/L 5.6(H) 5.6(H) 6.0(H)  Chloride 98 - 111 mmol/L 126(H) 124(H) 127(H)  CO2 22 - 32 mmol/L 16(L) 12(L) 14(L)  Calcium 8.9 - 10.3 mg/dL 7.9(L) 8.3(L) 8.3(L)    CBC Latest Ref Rng & Units 12/31/2018 12/30/2018 01/07/2019  WBC 4.0 - 10.5 K/uL 13.9(H) 16.5(H) 11.4(H)  Hemoglobin 13.0 - 17.0 g/dL 9.7(L) 10.2(L) 9.9(L)  Hematocrit 39.0 - 52.0 % 30.5(L) 32.3(L) 30.9(L)  Platelets 150 - 400 K/uL 199 175 208   Hepatic Function Latest Ref Rng & Units 12/31/2018 12/28/2018 04/25/2018  Total Protein 6.5 - 8.1 g/dL 6.7 6.3(L) 7.3  Albumin 3.5 - 5.0 g/dL 2.4(L) 2.2(L) 2.7(L)  AST 15 - 41 U/L '28 24 16  '$ ALT 0 - 44 U/L '16 12 8  '$ Alk Phosphatase 38 - 126 U/L 70 65 79  Total Bilirubin 0.3 - 1.2 mg/dL 0.9 0.9 0.5   CXR 7/21: No new film  IMPRESSION: SNF resident with profound  baseline impairment but reportedly no history of dementia  1)  Severe sepsis due to multiple infected pressure ulcers and LE vascular insuff ulcers 2) Severe dilated CM (LVEF < 20% in 04/2018) 3) AKI, now nonoliguric. Cr slightly improved 4) CKD stage 4 - not a candidate for HD 5) history of hypothyroidism 6) Severe pain due to pressure ulcers 7) Acute encephalopathy  DISCUSSION:   PLAN/REC: -DNR/DNI -Continue morphine as needed for pain -Continue current antibiotics -Change dressing changes to daily as it is causing him too much pain to undergo TID -Continue intravenous fluids -Continue IV levothyroxine -Continue norepinephrine, max dose 20 mcg/min.  Will not add any other vasopressors or vasoactive medications.   Daughter, Elmyra Ricks updated in detail over phone  Merton Border, MD PCCM service Mobile [phone] Pager [phone] 12/31/2018 1:23 PM

## 2018-12-31 NOTE — Progress Notes (Signed)
[facility]   Communication Note Family has decided not to pursue aggressive care at this time. Will not plan to move forward with amputation or lower extremity angiogram. Will sign off. If patient status changes / family wishes change please re-consult.  Discussed with Dr. Ellis Parents Terrisa Curfman PA-C 12/31/2018 11:35 AM

## 2018-12-31 NOTE — Progress Notes (Signed)
Initial Nutrition Assessment  RD working remotely.  DOCUMENTATION CODES:   Underweight  INTERVENTION:  No appropriate nutrition interventions at this time. Will continue to monitor plan of care and outcome of discussions regarding goals of care.  Patient will be at risk for refeeding syndrome from dextrose provided in sodium bicarbonate.  NUTRITION DIAGNOSIS:   Inadequate oral intake related to inability to eat as evidenced by NPO status.  GOAL:   Patient will meet greater than or equal to 90% of their needs  MONITOR:   Diet advancement, Labs, Weight trends, Skin, I & O's  REASON FOR ASSESSMENT:   Other (Comment)(Low BMI)    ASSESSMENT:   83 year old male with PMHx of HTN, hypothyroidism, vitamin D deficiency, glaucoma, CKD stage IV, GERD, gout, depression admitted from [facility] with severe sepsis, due to multiple infected pressure ulcers and LE vascular insufficiency ulcers, AKI, severe pain, acute encephalopathy, chronic systolic heart failure.   Patient unable to provide history as he has been poorly oriented. Yesterday he was moaning in extreme pain. Family has decided on DNR/DNI, no HD, and no amputations at this time. Focus is on relieving suffering and patient is receiving morphine for pain. He is NPO at this time and is not appropriate for nutrition intervention. Per review of weight history in chart it appears patient's UBW was around 80-83 kg. He is now 54.4 kg (120 lbs). Suspect weights were not being taken accurately for a while as it is highly unlikely patient lost this weight over a short period of time.  Medications reviewed and include: levothyroxine, cefepime, norepinephrine gtt at 17 mcg/min, sodium bicarbonate 50 mEq in D5 at 100 mL/hr, vancomycin.  Labs reviewed: CBG 124-144, Sodium 149, Potassium 5.6, Chloride 126, CO2 16, BUN 117, Creatinine 3.17.  Suspect patient is severely malnourished but unable to confirm if he meets criteria without completing  NFPE.  NUTRITION - FOCUSED PHYSICAL EXAM:  Unable to complete at this time.  Diet Order:   Diet Order            Diet NPO time specified  Diet effective midnight             EDUCATION NEEDS:   Not appropriate for education at this time  Skin:  Skin Assessment: Skin Integrity Issues:(unstageable nonhealing scrotum (10.16cm x 20.32cm x 0.5cm); unstageable nonhealing to scrotum (1.5cm x 0.5cm x 0.5cm); unstageable nonhealing left ischial tuberosity (2.5cm x 1.75cm x 0.5cm); multiple vascular wounds bilateral legs)   Last BM:  Unknown  Height:   Ht Readings from Last 1 Encounters:  12/16/2018 6' (1.829 m)   Weight:   Wt Readings from Last 1 Encounters:  12/28/2018 54.4 kg   Ideal Body Weight:  80.9 kg  BMI:  Body mass index is 16.27 kg/m.  Estimated Nutritional Needs:   Kcal:  1700-1900  Protein:  80-90 grams  Fluid:  1.5 L/day  Willey Blade, MS, RD, LDN Office: [phone] Pager: [phone] After Hours/Weekend Pager: [phone]

## 2019-01-01 ENCOUNTER — Other Ambulatory Visit: Payer: Self-pay

## 2019-01-01 DIAGNOSIS — L0390 Cellulitis, unspecified: Secondary | ICD-10-CM

## 2019-01-01 DIAGNOSIS — Z7189 Other specified counseling: Secondary | ICD-10-CM

## 2019-01-01 DIAGNOSIS — Z515 Encounter for palliative care: Secondary | ICD-10-CM

## 2019-01-01 LAB — BASIC METABOLIC PANEL WITH GFR
Anion gap: 7 (ref 5–15)
BUN: 107 mg/dL — ABNORMAL HIGH (ref 8–23)
CO2: 17 mmol/L — ABNORMAL LOW (ref 22–32)
Calcium: 7.8 mg/dL — ABNORMAL LOW (ref 8.9–10.3)
Chloride: 124 mmol/L — ABNORMAL HIGH (ref 98–111)
Creatinine, Ser: 2.93 mg/dL — ABNORMAL HIGH (ref 0.61–1.24)
GFR calc Af Amer: 22 mL/min — ABNORMAL LOW
GFR calc non Af Amer: 19 mL/min — ABNORMAL LOW
Glucose, Bld: 142 mg/dL — ABNORMAL HIGH (ref 70–99)
Potassium: 5.3 mmol/L — ABNORMAL HIGH (ref 3.5–5.1)
Sodium: 148 mmol/L — ABNORMAL HIGH (ref 135–145)

## 2019-01-01 MED ORDER — MORPHINE BOLUS VIA INFUSION
2.0000 mg | INTRAVENOUS | Status: DC | PRN
  Administered 2019-01-01 (×2): 2 mg via INTRAVENOUS
  Administered 2019-01-01: 18:00:00 4 mg via INTRAVENOUS
  Administered 2019-01-02: 03:00:00 2 mg via INTRAVENOUS
  Filled 2019-01-01: qty 2

## 2019-01-01 MED ORDER — LORAZEPAM 2 MG/ML IJ SOLN: 1.0000 mg | INTRAMUSCULAR | Status: DC | PRN

## 2019-01-01 MED ORDER — LORAZEPAM 1 MG PO TABS: 1.0000 mg | ORAL_TABLET | ORAL | Status: DC | PRN

## 2019-01-01 MED ORDER — GLYCOPYRROLATE 1 MG PO TABS
1.0000 mg | ORAL_TABLET | ORAL | Status: DC | PRN
  Filled 2019-01-01: qty 1

## 2019-01-01 MED ORDER — VANCOMYCIN HCL 500 MG IV SOLR: 500.0000 mg | INTRAVENOUS | Status: DC

## 2019-01-01 MED ORDER — POLYVINYL ALCOHOL 1.4 % OP SOLN
1.0000 [drp] | Freq: Four times a day (QID) | OPHTHALMIC | Status: DC | PRN
  Filled 2019-01-01: qty 15

## 2019-01-01 MED ORDER — GLYCOPYRROLATE 0.2 MG/ML IJ SOLN
0.2000 mg | INTRAMUSCULAR | Status: DC | PRN
  Filled 2019-01-01: qty 1

## 2019-01-01 MED ORDER — LORAZEPAM 2 MG/ML PO CONC: 1.0000 mg | ORAL | Status: DC | PRN

## 2019-01-01 MED ORDER — MORPHINE 100MG IN NS 100ML (1MG/ML) PREMIX INFUSION
2.0000 mg/h | INTRAVENOUS | Status: DC
  Administered 2019-01-01 – 2019-01-02 (×2): 6 mg/h via INTRAVENOUS
  Filled 2019-01-01 (×2): qty 100

## 2019-01-01 MED ORDER — BIOTENE DRY MOUTH MT LIQD: 15.0000 mL | OROMUCOSAL | Status: DC | PRN

## 2019-01-01 NOTE — Progress Notes (Signed)
Called [employer] rep to turn off ICD.  Magnet placed per instruction.

## 2019-01-01 NOTE — Progress Notes (Signed)
[place] at [place] NAME: Alan Marshall    MR#:  [id number]  DATE OF BIRTH:  1935/11/27  SUBJECTIVE:   Family has decided to wait a few more days before deciding on comfort care.  REVIEW OF SYSTEMS:  Unable to obtain review of systems due to confusion and mental status. Tolerating Diet: yes      DRUG ALLERGIES:  No Known Allergies  VITALS:  Blood pressure 112/73, pulse 93, temperature 98.7 F (37.1 C), temperature source Axillary, resp. rate 15, height 6' (1.829 m), weight 54.4 kg, SpO2 (!) 86 %.  PHYSICAL EXAMINATION:  Constitutional: Appears thin frail cachectic HENT: Normocephalic. Alan Marshall Kitchen Oropharynx is clear and moist.  Eyes: Conjunctivae and EOM are normal. PERRLA, no scleral icterus.  Neck: Normal ROM. Neck supple. No JVD. No tracheal deviation. CVS: RRR, S1/S2 +, no murmurs, no gallops, no carotid bruit.  Pulmonary: Effort and breath sounds normal, no stridor, rhonchi, wheezes, rales.  Abdominal: Soft. BS +,  no distension, tenderness, rebound or guarding.  Musculoskeletal: Right leg with contractures    neuro: Alert.  Confused skin (PER WOUND CARE NOTE)    The RLE has a pre-tibial sinus tract that constantly has purulent drainage ozzing out.  The RLE has multiple partial and full thickness wounds, The LLE has scattered wounds, however, these are more superficial and none of them have purulent drainage.  .  The coccyx has an opening that measures 0.2 cm x 0.2 cm and is 100% pink and clean.   The right ischial tuberosity has an unstageable wound that is brown/yellow.  The entire posterior and lateral sides of the scrotum have denuded skin likely from MASD from incontinence.  There is also an unstageable brown wound along the patient's left lateral scrotal surface.        LABORATORY PANEL:   CBC Recent Labs  Lab 12/31/18 0436  WBC 13.9*  HGB 9.7*  HCT 30.5*  PLT 199    ------------------------------------------------------------------------------------------------------------------  Chemistries  Recent Labs  Lab 12/30/18 0302  12/31/18 0436 01/01/19 0432  NA 148*   < > 149* 148*  K 6.0*   < > 5.6* 5.3*  CL 127*   < > 126* 124*  CO2 14*   < > 16* 17*  GLUCOSE 89   < > 157* 142*  BUN 128*   < > 117* 107*  CREATININE 3.54*   < > 3.17* 2.93*  CALCIUM 8.3*   < > 7.9* 7.8*  MG 2.5*  --   --   --   AST  --   --  28  --   ALT  --   --  16  --   ALKPHOS  --   --  70  --   BILITOT  --   --  0.9  --    < > = values in this interval not displayed.   ------------------------------------------------------------------------------------------------------------------  Cardiac Enzymes No results for input(s): TROPONINI in the last 168 hours. ------------------------------------------------------------------------------------------------------------------  RADIOLOGY:  No results found.   ASSESSMENT AND PLAN:   83 year old male with a severe protein malnutrition, chronic kidney disease stage IV, chronic systolic heart failure, PAF and chronic sacral and lower extremity wounds who presents to the emergency room due to ulcerations of lower extremities.  1.  Septic shock on pressors due to extensive bilateral lower extremity ulcerations and suspected PAD: Likelihood that these wounds will heal is essentially nonexistent. Family would like a more conservative approach.  Continue  wound care as per recommendations by wound care nurse.   2.  Acute encephalopathy due to septic shock. 3.  Acute on chronic kidney disease stage IV: Family does not want to pursue aggressive management.  4.  Chronic systolic heart failure without signs of exacerbation  5.  Hyperkalemia: Patient received Lokelma  6.  Severe protein calorie malnutrition:    Patient with overall very poor prognosis.  Unlikely that these wounds will heal.  Patient with overall very poor  prognosis.   Consider hospice care   Patient's family to decide in the next 2 to 3 days for consideration of hospice.  CODE STATUS: DNR  TOTAL TIME TAKING CARE OF THIS PATIENT: 20 minutes.     POSSIBLE D/C ??, DEPENDING ON CLINICAL CONDITION.   Bettey Costa M.D on 01/01/2019 at 12:39 PM  Between 7am to 6pm - Pager - [phone] After 6pm go to [email or web address] - password [email or web address] [place] [facility]  Office  [phone]  [name]; Alvester Morin, MD  Note: This dictation was prepared with Dragon dictation along with smaller phrase technology. Any transcriptional errors that result from this process are unintentional.

## 2019-01-01 NOTE — Progress Notes (Signed)
I updated Nicole(patients daughter and HCPOA) I have explained that patient is in pain and suffering. She would like to see what happens in next 2 days.    In my opinion, patient has decaying lower extremities and needs amputations. He is agonizing pain due to b/l lower leg ischemia and necrosis.    I strongly recommend Comfort care measures Palliative care consultation.   Corrin Parker, M.D.  [facility]  Medical Director [place] Director [facility] Cardio-Pulmonary Department

## 2019-01-01 NOTE — Progress Notes (Signed)
Pt with frequent episodes of apnea with sats down in the 80s.  He is able to recover in the 93-95   Jeraldine Loots, NP.  Continue to monitor.

## 2019-01-01 NOTE — Progress Notes (Signed)
[employer] representative to bedside.  Determined that the ICD from [place] not was complete.  Rep stated that is a [employer].  Called [employer].  Waiting on return call.

## 2019-01-01 NOTE — Consult Note (Signed)
Pharmacy Antibiotic Note  Alan Marshall is a 83 y.o. male admitted on 01/03/2019 with wound infection with bilateral necrosis on legs. Patient not a candidate for bilateral amputations and family choosing not to do any aggressive interventions at this time. They wish to continue antibiotics and wound care as tolerated by the patient. Pharmacy has been consulted for Vancomycin and Cefepime dosing.   Plan: Vancomycin 500 mg q48h switched to q36h as patient's renal function has improved slightly. Patient's family discussing comfort care vs continuing antibiotics, etc. Patient is currently on day 4 of antibiotics. I will order a vancomycin trough for tomorrow at 1900 prior to the 2000 dose in order to ensure that the patient is clearing appropriately.  Will continue cefepime 2 g IV q24h   Height: 6' (182.9 cm) Weight: 120 lb (54.4 kg) IBW/kg (Calculated) : 77.6  Temp (24hrs), Avg:98.3 F (36.8 C), Min:98.1 F (36.7 C), Max:98.7 F (37.1 C)  Recent Labs  Lab 12/28/2018 0943  12/14/2018 2043 12/30/18 0302 12/30/18 0800 12/31/18 0436 01/01/19 0432  WBC 11.4*  --   --  16.5*  --  13.9*  --   CREATININE  --    < > 3.90* 3.54* 3.47* 3.17* 2.93*  LATICACIDVEN 1.1  --   --   --   --   --   --   VANCORANDOM  --   --   --  8  --   --   --    < > = values in this interval not displayed.    Estimated Creatinine Clearance: 15.5 mL/min (A) (by C-G formula based on SCr of 2.93 mg/dL (H)).    No Known Allergies  Antimicrobials this admission: 07/21 Metronidazole x1 dose 07/21 Cefepime >> 07/21 Vancomycin >>   Dose adjustments this admission: 7/24 Vanc 500 mg q48h >> 500 mg q36h  Microbiology results: 7/21 BCx: NG 7/21 UCx: 40,000 yeast   Thank you for allowing pharmacy to be a part of this patient's care.  Tawnya Crook, PharmD Clinical Pharmacist 01/01/2019 12:34 PM

## 2019-01-01 NOTE — Progress Notes (Signed)
ICD turned off by manufacturer.

## 2019-01-01 NOTE — Progress Notes (Signed)
CRITICAL CARE PROGRESS NOTE       SUBJECTIVE FINDINGS & SIGNIFICANT EVENTS   83 yo male with bilateral critical limb ischemia.   On Levophed 28mcg still. Goals of care discussion with family today Elmyra Ricks daughter [place] PALS on case Ischial decub-3  Patient remains critically ill Prognosis is guarded    -Had lengthy conversation with POA daughter Abdul Beirne.  She has discussed care plan and current condition of her father with remainder of family and wishes to discontinue aggressive measures including levophed support as well as start morphine drip.  Further she wishes to make patient comfort care at this time.    PAST MEDICAL HISTORY   Past Medical History:  Diagnosis Date  . Anemia   . CKD (chronic kidney disease), stage IV ([place])   . Depression   . GERD (gastroesophageal reflux disease)   . Glaucoma   . Gout   . HFrEF (heart failure with reduced ejection fraction) ([place])    a. 11/2017 Echo: EF <20, diff HK. mild to mod AI. Asc Ao 73mm (mod dil). Mod MR. Mod dil LA/RV/RA. PASP 37mmHg. Mod to large circumferential pericardial effusion w/o tamponade; b. 12/05/2017 Echo: EF 15-20%, mod AI, mild to mod MR, mod TR, PASP 72mmHg, mod pericardial effusion w/o tamponade.  . Hypertension   . Hypothyroidism   . NICM (nonischemic cardiomyopathy) ([place])    a. S/p prior AICD->extracted in 2015 2/2 MSSA bacteremia; b. s/p Subcutaneous ICD; c. 11/2017 Echo: EF <20%; c. 12/05/2017 Echo: EF 15-20%.  . Pericardial effusion    a. 11/2017 Echo: Mod to large circumferential pericardial effusion w/o effusive-constrictive changes or tamponade physiology; b. 12/05/2017 Echo: EF 15-20%, mod pericardial effusion w/o hemodynamic compromise.  [name] Kitchen Permanent Atrial Fibrillation/Flutter ([place])    a. CHA2DS2VASc = 4-->No OAC 2/2 dementia.  .  Recurrent left pleural effusion    a. 11/2017 s/p thoracentesis.  Path neg.  . Vitamin D deficiency      SURGICAL HISTORY   Past Surgical History:  Procedure Laterality Date  . INSERT / REPLACE / REMOVE PACEMAKER    . KNEE SURGERY Right      FAMILY HISTORY   Family History  Problem Relation Age of Onset  . Hypertension Father      SOCIAL HISTORY   Social History   Tobacco Use  . Smoking status: Never Smoker  . Smokeless tobacco: Never Used  Substance Use Topics  . Alcohol use: Not Currently  . Drug use: Never     MEDICATIONS   Current Medication:  Current Facility-Administered Medications:  .  ceFEPIme (MAXIPIME) 2 g in sodium chloride 0.9 % 100 mL IVPB, 2 g, Intravenous, Q24H, Rowland Lathe, RPH, Stopped at 12/31/18 1221 .  dorzolamide (TRUSOPT) 2 % ophthalmic solution 1 drop, 1 drop, Both Eyes, BID, Ouma, Bing Neighbors, NP, 1 drop at 12/31/18 2129 .  hydrocerin (EUCERIN) cream 1 application, 1 application, Topical, BID, Lang Snow, NP, 1 application at 62/95/28 2129 .  latanoprost (XALATAN) 0.005 % ophthalmic solution 1 drop, 1 drop, Both Eyes, QHS, Ouma, Bing Neighbors, NP, 1 drop at 12/31/18 2128 .  levothyroxine (SYNTHROID, LEVOTHROID) injection 55 mcg, 55 mcg, Intravenous, Daily, Wilhelmina Mcardle, MD, 55 mcg at 12/31/18 1141 .  liver oil-zinc oxide (DESITIN) 40 % ointment, , Topical, Daily, Simonds, David B, MD .  morphine 2 MG/ML injection 2-4 mg, 2-4 mg, Intravenous, Q1H PRN, Wilhelmina Mcardle, MD, 4 mg at 12/31/18 2128 .  norepinephrine (LEVOPHED) 4mg  in 269mL premix  infusion, 0-20 mcg/min, Intravenous, Titrated, Wilhelmina Mcardle, MD, Last Rate: 48.8 mL/hr at 01/01/19 0600, 13 mcg/min at 01/01/19 0600 .  simethicone (MYLICON) chewable tablet 80 mg, 80 mg, Oral, Q6H PRN, Ouma, Bing Neighbors, NP .  sodium bicarbonate 50 mEq in dextrose 5 % 1,000 mL infusion, , Intravenous, Continuous, Wilhelmina Mcardle, MD, Last Rate: 100 mL/hr at  01/01/19 0600 .  vancomycin (VANCOCIN) 500 mg in sodium chloride 0.9 % 100 mL IVPB, 500 mg, Intravenous, Q48H, Tawnya Crook, RPH, Stopped at 12/30/18 1251    ALLERGIES   Patient has no known allergies.    REVIEW OF SYSTEMS    10 point ROS done and is negative except as per subjective   PHYSICAL EXAMINATION   Vitals:   01/01/19 0630 01/01/19 0700  BP: (!) 86/62 (!) 85/69  Pulse: 87 88  Resp: (!) 8 13  Temp:    SpO2: (!) 88% (!) 87%    GENERAL: mild distress due to acutely ill state HEAD: Normocephalic, atraumatic.  EYES: Pupils equal, round, reactive to light.  No scleral icterus.  MOUTH: Moist mucosal membrane. NECK: Supple. No thyromegaly. No nodules. No JVD.  PULMONARY: ctab CARDIOVASCULAR: S1 and S2. Regular rate and rhythm. No murmurs, rubs, or gallops.  GASTROINTESTINAL: Soft, nontender, non-distended. No masses. Positive bowel sounds. No hepatosplenomegaly.  MUSCULOSKELETAL: No swelling, clubbing, or edema.  NEUROLOGIC: Mild distress due to acute illness SKIN:intact,warm,dry   LABS AND IMAGING       LAB RESULTS: Recent Labs  Lab 12/30/18 0800 12/31/18 0436 01/01/19 0432  NA 147* 149* 148*  K 5.6* 5.6* 5.3*  CL 124* 126* 124*  CO2 12* 16* 17*  BUN 120* 117* 107*  CREATININE 3.47* 3.17* 2.93*  GLUCOSE 117* 157* 142*   Recent Labs  Lab 01/01/2019 0943 12/30/18 0302 12/31/18 0436  HGB 9.9* 10.2* 9.7*  HCT 30.9* 32.3* 30.5*  WBC 11.4* 16.5* 13.9*  PLT 208 175 199     IMAGING RESULTS: No results found.    ASSESSMENT AND PLAN    -Multidisciplinary rounds held today   Septic due to bilateral critical limb ischemia -use vasopressors to keep MAP>65 -follow up cultures -on cefepime -consider stress dose steroids      Acute on chronic systolic CHF with EF <89% -oxygen as needed -Lasix as tolerated -follow up cardiac enzymes as indicated ICU monitoring    Renal Failure- acute on chronic Stage 3 -follow chem 7 -follow UO  -continue Foley Catheter-assess need daily -d/c nonessential nephrotoxic drugs     NEUROLOGY - encephalopathy improved  - minimal sedation to achieve a RASS goal: -1 Wake up assessment pending    ID -continue IV abx as prescibed -follow up cultures   GI/Nutrition GI PROPHYLAXIS as indicated DIET-->TF's as tolerated Constipation protocol as indicated   ENDO - ICU hypoglycemic\Hyperglycemia protocol -check FSBS per protocol   ELECTROLYTES -follow labs as needed -replace as needed -pharmacy consultation   DVT/GI PRX ordered -SCDs  TRANSFUSIONS AS NEEDED MONITOR FSBS ASSESS the need for LABS as needed   Critical care provider statement:    Critical care time (minutes):  32   Critical care time was exclusive of:  Separately billable procedures and treating other patients   Critical care was necessary to treat or prevent imminent or life-threatening deterioration of the following conditions:  acute encephalopathy, hyperkalemia, critical limb ischemia, acute on chronic renal insufficiency.    Critical care was time spent personally by me on the following activities:  Development of  treatment plan with patient or surrogate, discussions with consultants, evaluation of patient's response to treatment, examination of patient, obtaining history from patient or surrogate, ordering and performing treatments and interventions, ordering and review of laboratory studies and re-evaluation of patient's condition.  I assumed direction of critical care for this patient from another provider in my specialty: no    This document was prepared using Dragon voice recognition software and may include unintentional dictation errors.    Ottie Glazier, M.D.  Division of [place]

## 2019-01-01 NOTE — Consult Note (Signed)
Consultation Note Date: 01/01/2019   Patient Name: Alan Marshall  DOB: Jan 24, 1936  MRN: [id number]  Age / Sex: 83 y.o., male  PCP: Alvester Morin, MD Referring Physician: Bettey Costa, MD  Reason for Consultation: Establishing goals of care  HPI/Patient Profile:  83 year old male patient with history of chronic atrial fibrillation, chronic systolic heart failure, hyperlipidemia, hypertension, AICD, CKD stage IV, anemia, hypothyroidism, GERD presented to emergency room for lower extremity necrotic wounds and ulcers.   Clinical Assessment and Goals of Care: Patient is resting in bed. He has been moaning in pain. He does not speak, and does not appear to attempt to communicate with me.   Spoke with his daughter and HPOA on the phone. She discusses that she has siblings, but she is HPOA. She states she and her husband have been in [place] for around a week, as they lived in [place], and had planned to move overseas, but due to [place] were unable to. She considers it divine intervention that she is here. She states he has been living in [facility] for around 2 years and she had no idea of his condition. She states she is worried about finances as the facility takes his checks, and she understands they will need to pay medical bills.     We discussed his diagnosis, prognosis, and GOC.   A detailed discussion was had today regarding advanced directives.  Concepts specific to code status, artifical feeding and hydration, IV antibiotics and rehospitalization were discussed.  The difference between an aggressive medical intervention path and a comfort care path was discussed.  Values and goals of care important to patient and family were attempted to be elicited.  Discussed limitations of medical interventions to prolong quality of life in some situations and discussed the concept of human mortality.   She  states the doctors have kept her well updated. She states she has been speaking with the chaplain as well. She tells me she is not sure what to do. She states she is hopeful her father will have return of mental status to tell her what to do. She states he is afraid of death because his mother was removed from the ventilator and nobody told him until afterwards.  She states her father had initially refused amputation, but had changed his mind and asked for the appointment that was to be today. She states [name] said " I want my leg amputated, I can get a prosthetic."   We discussed his nutritional status, ulcers, HF and kidney disease. We discussed concerns for pain and suffering in various scenerios. She states they have decided for non-operative management, no HD,  DNR/DNI, and pain medication if he needs it or is yelling in communication of pain.  She states she does not believe he would want a feeding tube. She voices understanding that with giving pain medication that there is risk of low blood pressure among other things such as change in respiratory status, and understands he is on the maximum dose of pressors.  Elmyra Ricks tells me she comes to the hospital hoping he will know she is there, and that it is okay to let go.  We discussed comfort care vs life prolonging care including antibiotics and pressors. She states she will consider stopping these measures in favor of full comfort care. She tells me at this time, she needs to go to figure out how to join a funeral remotely as her father in law just died a few days ago.      SUMMARY OF RECOMMENDATIONS   Continue current care.   Psycho-social/Spiritual:   Desire for further Chaplaincy support:Recommend chaplaincy to continue following.    Prognosis:   Very poor.   Discharge Planning: To Be Determined      Primary Diagnoses: Present on Admission: . Sepsis ([place])   I have reviewed the medical record, interviewed the patient and family,  and examined the patient. The following aspects are pertinent.  Past Medical History:  Diagnosis Date  . Anemia   . CKD (chronic kidney disease), stage IV ([place])   . Depression   . GERD (gastroesophageal reflux disease)   . Glaucoma   . Gout   . HFrEF (heart failure with reduced ejection fraction) ([place])    a. 11/2017 Echo: EF <20, diff HK. mild to mod AI. Asc Ao 25mm (mod dil). Mod MR. Mod dil LA/RV/RA. PASP 33mmHg. Mod to large circumferential pericardial effusion w/o tamponade; b. 12/05/2017 Echo: EF 15-20%, mod AI, mild to mod MR, mod TR, PASP 32mmHg, mod pericardial effusion w/o tamponade.  . Hypertension   . Hypothyroidism   . NICM (nonischemic cardiomyopathy) ([place])    a. S/p prior AICD->extracted in 2015 2/2 MSSA bacteremia; b. s/p Subcutaneous ICD; c. 11/2017 Echo: EF <20%; c. 12/05/2017 Echo: EF 15-20%.  . Pericardial effusion    a. 11/2017 Echo: Mod to large circumferential pericardial effusion w/o effusive-constrictive changes or tamponade physiology; b. 12/05/2017 Echo: EF 15-20%, mod pericardial effusion w/o hemodynamic compromise.  [name] Kitchen Permanent Atrial Fibrillation/Flutter ([place])    a. CHA2DS2VASc = 4-->No OAC 2/2 dementia.  . Recurrent left pleural effusion    a. 11/2017 s/p thoracentesis.  Path neg.  . Vitamin D deficiency    Social History   Socioeconomic History  . Marital status: Single    Spouse name: Not on file  . Number of children: Not on file  . Years of education: Not on file  . Highest education level: Not on file  Occupational History  . Not on file  Social Needs  . Financial resource strain: Not on file  . Food insecurity    Worry: Not on file    Inability: Not on file  . Transportation needs    Medical: Not on file    Non-medical: Not on file  Tobacco Use  . Smoking status: Never Smoker  . Smokeless tobacco: Never Used  Substance and Sexual Activity  . Alcohol use: Not Currently  . Drug use: Never  . Sexual activity: Not on file  Lifestyle  .  Physical activity    Days per week: Not on file    Minutes per session: Not on file  . Stress: Not on file  Relationships  . Social Herbalist on phone: Not on file    Gets together: Not on file    Attends religious service: Not on file    Active member of club or organization: Not on file    Attends meetings of clubs or organizations: Not on file  Relationship status: Not on file  Other Topics Concern  . Not on file  Social History Narrative   Chronic resident at [facility]. Wheelchair bound at baseline   Family History  Problem Relation Age of Onset  . Hypertension Father    Scheduled Meds: . dorzolamide  1 drop Both Eyes BID  . hydrocerin  1 application Topical BID  . latanoprost  1 drop Both Eyes QHS  . levothyroxine  55 mcg Intravenous Daily  . liver oil-zinc oxide   Topical Daily   Continuous Infusions: . ceFEPime (MAXIPIME) IV Stopped (01/01/19 0945)  . norepinephrine (LEVOPHED) Adult infusion 13 mcg/min (01/01/19 1000)  .  sodium bicarbonate  infusion 1000 mL 100 mL/hr at 01/01/19 1000  . vancomycin Stopped (01/01/19 0856)   PRN Meds:.morphine injection, simethicone Medications Prior to Admission:  Prior to Admission medications   Medication Sig Start Date End Date Taking? Authorizing Provider  acetaminophen (TYLENOL) 500 MG tablet Take 1,000 mg by mouth 3 (three) times daily.    Yes [provider]  allopurinol (ZYLOPRIM) 100 MG tablet Take 0.5 tablets (50 mg total) by mouth daily. Patient taking differently: Take 50 mg by mouth daily with supper.  11/20/17  Yes Fritzi Mandes, MD  amiodarone (PACERONE) 100 MG tablet Take 100 mg by mouth daily.   Yes [provider]  carvedilol (COREG) 3.125 MG tablet Take 1 tablet (3.125 mg total) by mouth 2 (two) times daily with a meal. 11/20/17  Yes Fritzi Mandes, MD  chlorhexidine (HIBICLENS) 4 % external liquid Apply topically daily as needed. Wash lower legs daily during dressing changes   Yes  [provider]  citalopram (CELEXA) 20 MG tablet Take 20 mg by mouth daily.    Yes [provider]  dicloxacillin (DYNAPEN) 500 MG capsule Take 500 mg by mouth every 8 (eight) hours. 12/10/17  Yes [provider]  diphenhydrAMINE (BENADRYL) 25 MG tablet Take 12.5 mg by mouth daily as needed for itching.   Yes [provider]  divalproex (DEPAKOTE SPRINKLE) 125 MG capsule Take 125 mg by mouth 2 (two) times daily.   Yes [provider]  dorzolamide (TRUSOPT) 2 % ophthalmic solution Place 1 drop into both eyes 2 (two) times daily.   Yes [provider]  ferrous sulfate 324 MG TBEC Take 324 mg by mouth daily.   Yes [provider]  Ipratropium-Albuterol (COMBIVENT RESPIMAT) 20-100 MCG/ACT AERS respimat Inhale 2 puffs into the lungs every 6 (six) hours as needed for wheezing or shortness of breath.   Yes [provider]  latanoprost (XALATAN) 0.005 % ophthalmic solution Place 1 drop into both eyes at bedtime.   Yes [provider]  levothyroxine (SYNTHROID, LEVOTHROID) 112 MCG tablet Take 112 mcg by mouth daily before breakfast.   Yes [provider]  loratadine (CLARITIN) 10 MG tablet Take 10 mg by mouth daily.   Yes [provider]  Melatonin 3 MG TABS Take 3 mg by mouth at bedtime as needed (insomnia).    Yes [provider]  Menthol-Zinc Oxide 0.44-20.625 % OINT Apply topically 2 (two) times daily. To scrotum   Yes [provider]  metolazone (ZAROXOLYN) 2.5 MG tablet Take 1 tablet (2.5 mg total) by mouth as needed. For a weight gain of 3 pounds overnight or 5 pounds in one week Patient taking differently: Take 2.5 mg by mouth daily as needed (for weight gain).  04/07/18 01/01/2019 Yes Dunn, Areta Haber, PA-C  oxyCODONE (OXY IR/ROXICODONE) 5  MG immediate release tablet Take 5 mg by mouth every 4 (four) hours as needed for severe pain.   Yes [provider]  polyethylene glycol powder  (GAVILAX) powder Take 17 g by mouth daily.   Yes [provider]  potassium chloride SA (K-DUR,KLOR-CON) 20 MEQ tablet Take 20 mEq by mouth daily.   Yes [provider]  sennosides-docusate sodium (SENOKOT-S) 8.6-50 MG tablet Take 1 tablet by mouth 2 (two) times daily.   Yes [provider]  simethicone (MYLICON) 80 MG chewable tablet Chew 80 mg by mouth every 6 (six) hours as needed for flatulence.   Yes [provider]  Skin Protectants, Misc. (EUCERIN) cream Apply 1 application topically 2 (two) times daily. To feet, legs, arms   Yes [provider]  torsemide (DEMADEX) 20 MG tablet Take 2 tablets (40 mg total) by mouth 2 (two) times daily. Patient taking differently: Take 30-40 mg by mouth 2 (two) times daily. Take 40 mg in the morning, and 30 mg in the evening 11/20/17  Yes Fritzi Mandes, MD   No Known Allergies Review of Systems  Unable to perform ROS   Physical Exam Constitutional:      Comments: Thin and frail.   Pulmonary:     Effort: Pulmonary effort is normal.  Skin:    General: Skin is warm and dry.  Neurological:     Comments: Does not appear to attempt communication at time of my visit.      Vital Signs: BP 112/73   Pulse 100   Temp 98.7 F (37.1 C) (Axillary)   Resp 14   Ht 6' (1.829 m)   Wt 54.4 kg   SpO2 92%   BMI 16.27 kg/m  Pain Scale: Faces POSS *See Group Information*: S-Acceptable,Sleep, easy to arouse Pain Score: Asleep   SpO2: SpO2: 92 % O2 Device:SpO2: 92 % O2 Flow Rate: .   IO: Intake/output summary:   Intake/Output Summary (Last 24 hours) at 01/01/2019 1050 Last data filed at 01/01/2019 1000 Gross per 24 hour  Intake 3829.15 ml  Output 1220 ml  Net 2609.15 ml    LBM: Last BM Date: (Unknown) Baseline Weight: Weight: 54.4 kg Most recent weight: Weight: 54.4 kg     Palliative Assessment/Data: 10%     Time In: 9:30 Time Out: 11:15 Time Total:105 Greater than 50%  of this time was spent  counseling and coordinating care related to the above assessment and plan.  Signed by: Asencion Gowda, NP   Please contact Palliative Medicine Team phone at [phone] for questions and concerns.  For individual provider: See Shea Evans

## 2019-01-03 LAB — AEROBIC/ANAEROBIC CULTURE W GRAM STAIN (SURGICAL/DEEP WOUND): Gram Stain: NONE SEEN

## 2019-01-03 LAB — CULTURE, BLOOD (ROUTINE X 2)
Culture: NO GROWTH
Culture: NO GROWTH
Special Requests: ADEQUATE

## 2019-01-09 NOTE — Progress Notes (Signed)
[facility] [place] NAME: Alan Marshall    MR#:  [id number]  DATE OF BIRTH:  27-Jan-1936  SUBJECTIVE:   The patient is unresponsive, on comfort care.  REVIEW OF SYSTEMS:  Unable to obtain review of systems due to confusion and mental status.  DRUG ALLERGIES:  No Known Allergies  VITALS:  Blood pressure (!) 58/44, pulse 77, temperature 97.9 F (36.6 C), temperature source Axillary, resp. rate (!) 8, height 6' (1.829 m), weight 54.4 kg, SpO2 90 %.  PHYSICAL EXAMINATION:  Constitutional: Appears thin frail cachectic HENT: Normocephalic. Eyes: Eyes closed. CVS: RRR, S1/S2 +, no murmurs, no gallops. Pulmonary: Effort and breath sounds normal, no stridor, rhonchi, wheezes, rales.  Abdominal: Soft. BS +,  no distension, tenderness, rebound or guarding.  Musculoskeletal: Right leg with contractures  neuro: Unresponsive. LABORATORY PANEL:   CBC Recent Labs  Lab 12/31/18 0436  WBC 13.9*  HGB 9.7*  HCT 30.5*  PLT 199   ------------------------------------------------------------------------------------------------------------------  Chemistries  Recent Labs  Lab 12/30/18 0302  12/31/18 0436 01/01/19 0432  NA 148*   < > 149* 148*  K 6.0*   < > 5.6* 5.3*  CL 127*   < > 126* 124*  CO2 14*   < > 16* 17*  GLUCOSE 89   < > 157* 142*  BUN 128*   < > 117* 107*  CREATININE 3.54*   < > 3.17* 2.93*  CALCIUM 8.3*   < > 7.9* 7.8*  MG 2.5*  --   --   --   AST  --   --  28  --   ALT  --   --  16  --   ALKPHOS  --   --  70  --   BILITOT  --   --  0.9  --    < > = values in this interval not displayed.   ------------------------------------------------------------------------------------------------------------------  Cardiac Enzymes No results for input(s): TROPONINI in the last 168 hours. ------------------------------------------------------------------------------------------------------------------  RADIOLOGY:  No results  found.   ASSESSMENT AND PLAN:   83 year old male with a severe protein malnutrition, chronic kidney disease stage IV, chronic systolic heart failure, PAF and chronic sacral and lower extremity wounds who presents to the emergency room due to ulcerations of lower extremities.  1.  Septic shock on pressors due to extensive bilateral lower extremity ulcerations and suspected PAD: Off pressors drip. 2.  Acute encephalopathy due to septic shock. 3.  Acute on chronic kidney disease stage IV: Family does not want to pursue aggressive management.  4.  Chronic systolic heart failure without signs of exacerbation  5.  Hyperkalemia: Patient received Lokelma  6.  Severe protein calorie malnutrition:    Patient with overall very poor prognosis. On comfort care.[name] Kitchen  CODE STATUS: DNR  TOTAL TIME TAKING CARE OF THIS PATIENT: 15 minutes. POSSIBLE D/C ??, DEPENDING ON CLINICAL CONDITION.  Demetrios Loll M.D on 19-Jan-2019 at 12:43 PM  Between 7am to 6pm - Pager - [phone] After 6pm go to [email or web address] - password [email or web address] [facility]  Office  [phone]  [name]; Alvester Morin, MD  Note: This dictation was prepared with Dragon dictation along with smaller phrase technology. Any transcriptional errors that result from this process are unintentional.

## 2019-01-09 NOTE — Progress Notes (Signed)
   01-18-19 1400  Clinical Encounter Type  Visited With Patient;Family  Visit Type Follow-up;Psychological support;Spiritual support  Referral From Nurse  Spiritual Encounters  Spiritual Needs Emotional  Stress Factors  Patient Stress Factors Exhausted;Health changes  Family Stress Factors Exhausted;Major life changes  Ch received an OR for end of life care. Pt was sleeping soundly and his daughter Alan Marshall was at bedside. Alan Marshall filled the ch in on how she had plans to move overseas but wasn't able to due to Covid. She now found a place to live in [place] and plan to move from [place] in three weeks. Out of all her siblings and relatives, Alan Marshall is the primary care giver of the pt. She said that family had just lost pt's father whose funeral was this week and that it has been tough. Daughter feels relieved after having made the decision to move the pt to comfort care. She sees her being in [place] with her father at this time as divine providence. Even though she has a lot of responsibilities to take care of her family and the pt, she is managing well, taking one thing at a time. Pt also said she has been visited by several chaplains. Ch will continue to provide emotional/spiritual support in future follow ups.

## 2019-01-09 NOTE — Progress Notes (Signed)
Pt passed away at 1810, Dr. Margaretmary Eddy notified. Daughter at bedside,she requested a few minutes with the patient to call family and to say her goodbyes before post mortem care was done. [place] donor services contacted, the referral number is [phone].

## 2019-01-09 NOTE — Progress Notes (Signed)
CRITICAL CARE PROGRESS NOTE       SUBJECTIVE FINDINGS & SIGNIFICANT EVENTS   83 yo male with bilateral critical limb ischemia.   Goals of care discussion with family today Elmyra Ricks daughter [place] PALS on case Ischial decub-3  Patient remains critically ill Prognosis is guarded    -Had lengthy conversation with POA daughter Ilay Capshaw.  She has discussed care plan and current condition of her father with remainder of family and wishes to discontinue aggressive measures including levophed support as well as start morphine drip.  Further she wishes to make patient comfort care at this time.    PAST MEDICAL HISTORY   Past Medical History:  Diagnosis Date  . Anemia   . CKD (chronic kidney disease), stage IV ([place])   . Depression   . GERD (gastroesophageal reflux disease)   . Glaucoma   . Gout   . HFrEF (heart failure with reduced ejection fraction) ([place])    a. 11/2017 Echo: EF <20, diff HK. mild to mod AI. Asc Ao 68mm (mod dil). Mod MR. Mod dil LA/RV/RA. PASP 71mmHg. Mod to large circumferential pericardial effusion w/o tamponade; b. 12/05/2017 Echo: EF 15-20%, mod AI, mild to mod MR, mod TR, PASP 87mmHg, mod pericardial effusion w/o tamponade.  . Hypertension   . Hypothyroidism   . NICM (nonischemic cardiomyopathy) ([place])    a. S/p prior AICD->extracted in 2015 2/2 MSSA bacteremia; b. s/p Subcutaneous ICD; c. 11/2017 Echo: EF <20%; c. 12/05/2017 Echo: EF 15-20%.  . Pericardial effusion    a. 11/2017 Echo: Mod to large circumferential pericardial effusion w/o effusive-constrictive changes or tamponade physiology; b. 12/05/2017 Echo: EF 15-20%, mod pericardial effusion w/o hemodynamic compromise.  [name] Kitchen Permanent Atrial Fibrillation/Flutter ([place])    a. CHA2DS2VASc = 4-->No OAC 2/2 dementia.  . Recurrent left pleural  effusion    a. 11/2017 s/p thoracentesis.  Path neg.  . Vitamin D deficiency      SURGICAL HISTORY   Past Surgical History:  Procedure Laterality Date  . INSERT / REPLACE / REMOVE PACEMAKER    . KNEE SURGERY Right      FAMILY HISTORY   Family History  Problem Relation Age of Onset  . Hypertension Father      SOCIAL HISTORY   Social History   Tobacco Use  . Smoking status: Never Smoker  . Smokeless tobacco: Never Used  Substance Use Topics  . Alcohol use: Not Currently  . Drug use: Never     MEDICATIONS   Current Medication:  Current Facility-Administered Medications:  .  antiseptic oral rinse (BIOTENE) solution 15 mL, 15 mL, Topical, PRN, Tukov-Yual, Magdalene S, NP .  glycopyrrolate (ROBINUL) tablet 1 mg, 1 mg, Oral, Q4H PRN **OR** glycopyrrolate (ROBINUL) injection 0.2 mg, 0.2 mg, Subcutaneous, Q4H PRN **OR** glycopyrrolate (ROBINUL) injection 0.2 mg, 0.2 mg, Intravenous, Q4H PRN, Tukov-Yual, Magdalene S, NP .  LORazepam (ATIVAN) tablet 1 mg, 1 mg, Oral, Q4H PRN **OR** LORazepam (ATIVAN) 2 MG/ML concentrated solution 1 mg, 1 mg, Sublingual, Q4H PRN **OR** LORazepam (ATIVAN) injection 1 mg, 1 mg, Intravenous, Q4H PRN, Tukov-Yual, Magdalene S, NP .  morphine 100mg  in NS 158mL (1mg /mL) infusion - premix, 2-10 mg/hr, Intravenous, Continuous, Adwoa Axe, MD, Last Rate: 6 mL/hr at 01-30-2019 0417, 6 mg/hr at 2019-01-30 0417 .  morphine bolus via infusion 2 mg, 2 mg, Intravenous, PRN, Ottie Glazier, MD, 2 mg at 01/30/2019 0239 .  polyvinyl alcohol (LIQUIFILM TEARS) 1.4 % ophthalmic solution 1 drop, 1 drop, Both Eyes, QID PRN, Tukov-Yual,  Arlyss Gandy, NP    ALLERGIES   Patient has no known allergies.    REVIEW OF SYSTEMS    10 point ROS done and is negative except as per subjective   PHYSICAL EXAMINATION   Vitals:   01/23/2019 0300 2019-01-23 0330  BP:    Pulse: 78 77  Resp: (!) 8 12  Temp:    SpO2: (!) 89% 90%    GENERAL: mild distress due to acutely ill  state HEAD: Normocephalic, atraumatic.  EYES: Pupils equal, round, reactive to light.  No scleral icterus.  MOUTH: Moist mucosal membrane. NECK: Supple. No thyromegaly. No nodules. No JVD.  PULMONARY: ctab CARDIOVASCULAR: S1 and S2. Regular rate and rhythm. No murmurs, rubs, or gallops.  GASTROINTESTINAL: Soft, nontender, non-distended. No masses. Positive bowel sounds. No hepatosplenomegaly.  MUSCULOSKELETAL: No swelling, clubbing, or edema.  NEUROLOGIC: Mild distress due to acute illness SKIN:intact,warm,dry   LABS AND IMAGING       LAB RESULTS: Recent Labs  Lab 12/30/18 0800 12/31/18 0436 01/01/19 0432  NA 147* 149* 148*  K 5.6* 5.6* 5.3*  CL 124* 126* 124*  CO2 12* 16* 17*  BUN 120* 117* 107*  CREATININE 3.47* 3.17* 2.93*  GLUCOSE 117* 157* 142*   Recent Labs  Lab 12/18/2018 0943 12/30/18 0302 12/31/18 0436  HGB 9.9* 10.2* 9.7*  HCT 30.9* 32.3* 30.5*  WBC 11.4* 16.5* 13.9*  PLT 208 175 199     IMAGING RESULTS: No results found.    ASSESSMENT AND PLAN    -Multidisciplinary rounds held today   Septic due to bilateral critical limb ischemia -use vasopressors to keep MAP>65 -follow up cultures -on cefepime -consider stress dose steroids      Acute on chronic systolic CHF with EF <93% -oxygen as needed -Lasix as tolerated -follow up cardiac enzymes as indicated ICU monitoring    Renal Failure- acute on chronic Stage 3 -follow chem 7 -follow UO -continue Foley Catheter-assess need daily -d/c nonessential nephrotoxic drugs     NEUROLOGY - encephalopathy improved  - minimal sedation to achieve a RASS goal: -1 Wake up assessment pending    ID -continue IV abx as prescibed -follow up cultures   GI/Nutrition GI PROPHYLAXIS as indicated DIET-->TF's as tolerated Constipation protocol as indicated   ENDO - ICU hypoglycemic\Hyperglycemia protocol -check FSBS per protocol   ELECTROLYTES -follow labs as needed -replace as needed  -pharmacy consultation   DVT/GI PRX ordered -SCDs  TRANSFUSIONS AS NEEDED MONITOR FSBS ASSESS the need for LABS as needed   Critical care provider statement:    Critical care time (minutes):  31   Critical care time was exclusive of:  Separately billable procedures and treating other patients   Critical care was necessary to treat or prevent imminent or life-threatening deterioration of the following conditions:  acute encephalopathy, hyperkalemia, critical limb ischemia, acute on chronic renal insufficiency.    Critical care was time spent personally by me on the following activities:  Development of treatment plan with patient or surrogate, discussions with consultants, evaluation of patient's response to treatment, examination of patient, obtaining history from patient or surrogate, ordering and performing treatments and interventions, ordering and review of laboratory studies and re-evaluation of patient's condition.  I assumed direction of critical care for this patient from another provider in my specialty: no    This document was prepared using Dragon voice recognition software and may include unintentional dictation errors.    Ottie Glazier, M.D.  Division of Pulmonary & Critical Care Medicine  [place]

## 2019-01-09 NOTE — Progress Notes (Signed)
Pt transferred to room 112 on ICU bed with morphine gtt infusing, report given to [facility].  Daughter Alan Marshall accompanied patient to room.  His chart went with him, he was wearing his two rings and Alan Marshall placed his two watches and his dentures in his belongings bag.

## 2019-01-09 NOTE — Discharge Summary (Signed)
   [place] [facility] [place] NAME: Alan Marshall    MR#:  [id number]  DATE OF BIRTH:  [birth year]  DATE OF ADMISSION:  12/12/2018   ADMITTING PHYSICIAN: Lang Snow, NP  DATE OF DISCHARGE: 04-Jan-2019 10:20 PM  PRIMARY CARE PHYSICIAN: Alvester Morin, MD   ADMISSION DIAGNOSIS:  Hyperkalemia [E87.5] Cellulitis, unspecified cellulitis site [L03.90] Renal failure, unspecified chronicity [N19] Sepsis, due to unspecified organism, unspecified whether acute organ dysfunction present ([place]) [A41.9] Sepsis ([place]) [A41.9] DISCHARGE DIAGNOSIS:  Active Problems:   Sepsis ([place])   Pressure injury of skin  SECONDARY DIAGNOSIS:   Past Medical History:  Diagnosis Date  . Anemia   . CKD (chronic kidney disease), stage IV ([place])   . Depression   . GERD (gastroesophageal reflux disease)   . Glaucoma   . Gout   . HFrEF (heart failure with reduced ejection fraction) ([employer])    a. 11/2017 Echo: EF <20, diff HK. mild to mod AI. Asc Ao 53mm (mod dil). Mod MR. Mod dil LA/RV/RA. PASP 58mmHg. Mod to large circumferential pericardial effusion w/o tamponade; b. 12/05/2017 Echo: EF 15-20%, mod AI, mild to mod MR, mod TR, PASP 32mmHg, mod pericardial effusion w/o tamponade.  . Hypertension   . Hypothyroidism   . NICM (nonischemic cardiomyopathy) ([place])    a. S/p prior AICD->extracted in 2015 2/2 MSSA bacteremia; b. s/p Subcutaneous ICD; c. 11/2017 Echo: EF <20%; c. 12/05/2017 Echo: EF 15-20%.  . Pericardial effusion    a. 11/2017 Echo: Mod to large circumferential pericardial effusion w/o effusive-constrictive changes or tamponade physiology; b. 12/05/2017 Echo: EF 15-20%, mod pericardial effusion w/o hemodynamic compromise.  [name] Kitchen Permanent Atrial Fibrillation/Flutter ([place])    a. CHA2DS2VASc = 4-->No OAC 2/2 dementia.  . Recurrent left pleural effusion    a. 11/2017 s/p thoracentesis.  Path neg.  . Vitamin D deficiency    HOSPITAL COURSE:   83 year old male  with a severe protein malnutrition, chronic kidney disease stage IV, chronic systolic heart failure, PAF and chronic sacral and lower extremity wounds who presented to the emergency room due to ulcerations of lower extremities.  1.  Septic shock on pressors due to extensive bilateral lower extremity ulceration infection and suspected PAD: She was treated with pressors drip. 2.  Acute encephalopathy due to septic shock. 3.  Acute on chronic kidney disease stage IV: Family did not want to pursue aggressive management.  4.  Chronic systolic heart failure without signs of exacerbation  5.  Hyperkalemia: Patient received Lokelma  6.  Severe protein calorie malnutrition:   Patient was put on comfort care and died at 18:10 on 04-Jan-2019.   Demetrios Loll M.D on 01/03/2019 at 1:13 PM  Between 7am to 6pm - Pager - [phone]  After 6pm go to [email or web address] - Proofreader  [facility]  Office  [phone]  [name]; Alvester Morin, MD   Note: This dictation was prepared with Dragon dictation along with smaller phrase technology. Any transcriptional errors that result from this process are unintentional.

## 2019-01-09 DEATH — deceased

## 2019-02-11 ENCOUNTER — Ambulatory Visit: Payer: Medicare Other | Admitting: Cardiovascular Disease

## 2019-08-15 IMAGING — DX DG CHEST 1V PORT
1 series · 1 of 1 positions shown · non-contrast
Comparison: None.

CLINICAL DATA: Dyspnea

EXAM:
PORTABLE CHEST 1 VIEW

[chest ap]
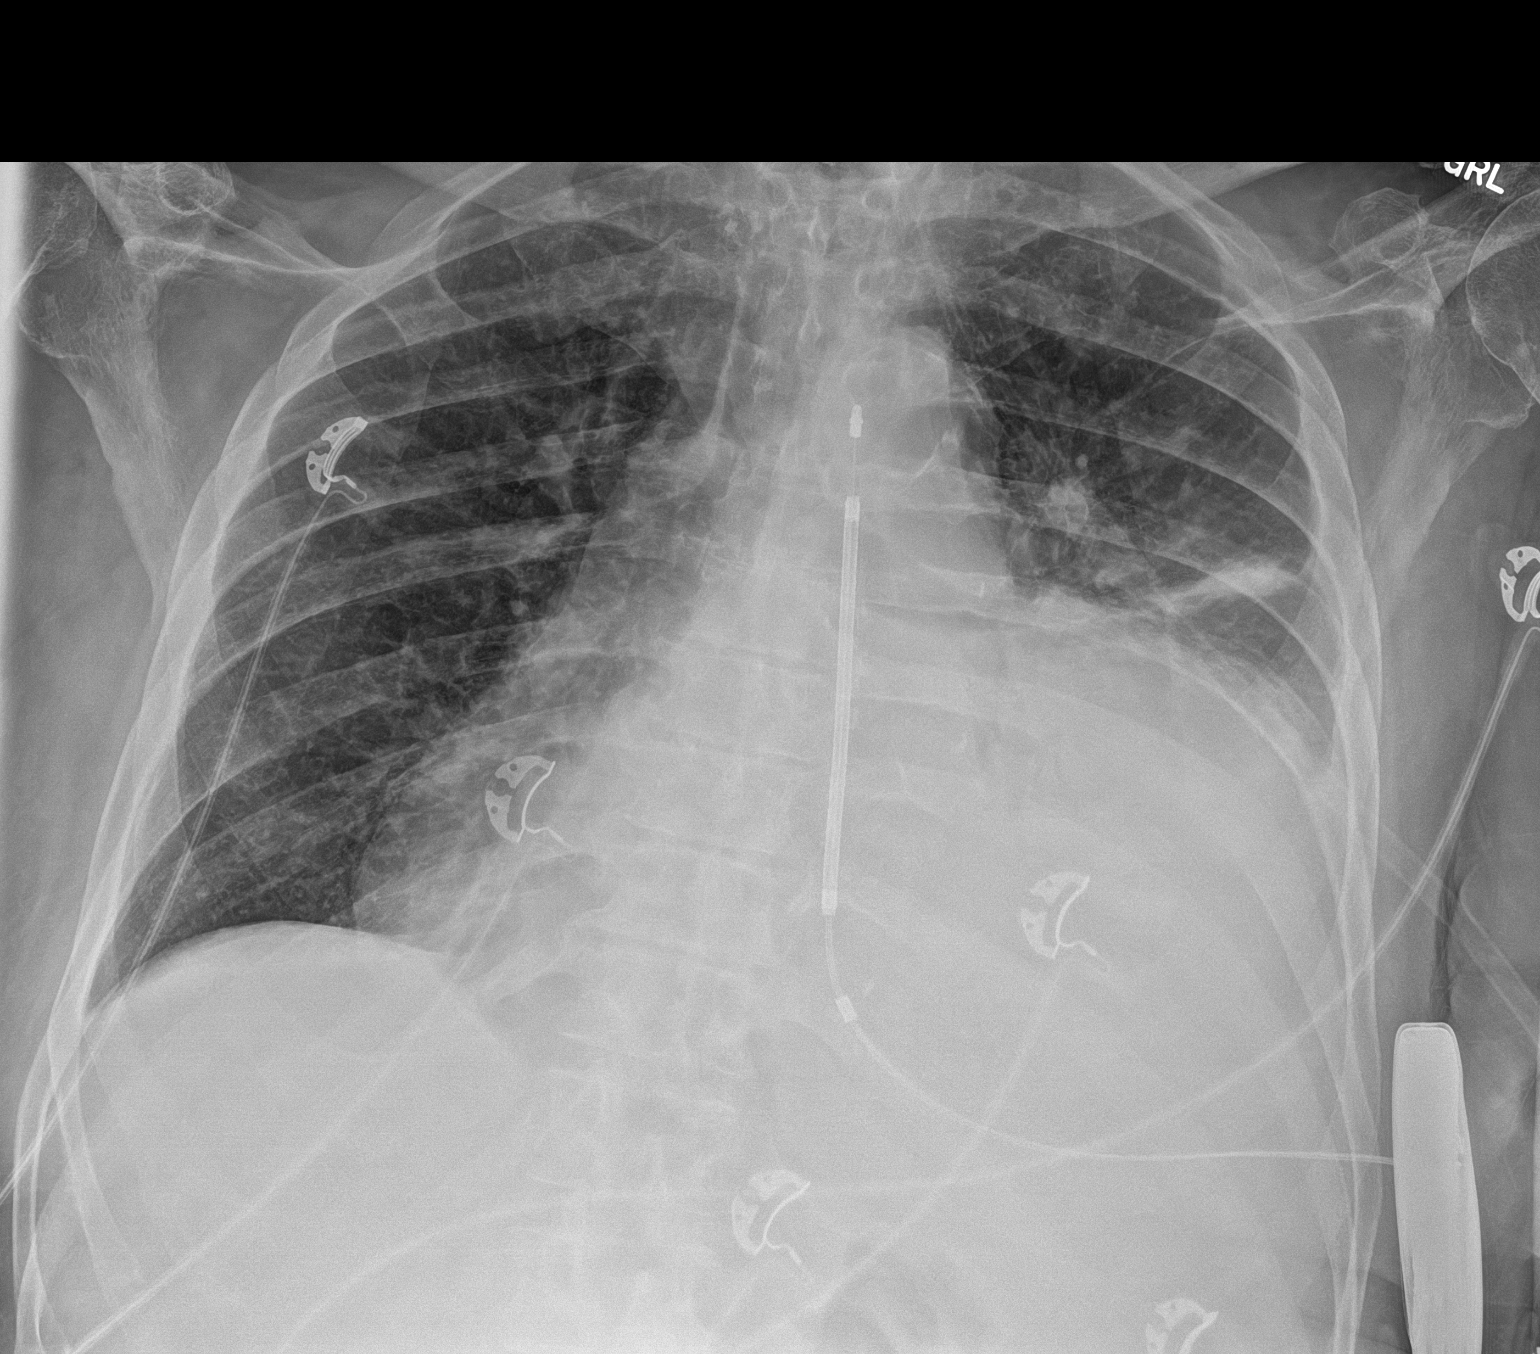

[1 of 1 positions shown; findings below may reference images not displayed]

FINDINGS: The mediastinal contour is normal. The heart size is enlarged.
Consolidation of the left mid and lung base are noted. The right
lung is clear. No acute abnormalities identified in the bony
structures.
IMPRESSION: Cardiomegaly.

Consolidation left mid and lung base, underlying pneumonia not
excluded.

## 2019-08-16 IMAGING — CT CT HEAD W/O CM
3 series · 14 of 47 positions shown, 16 images · non-contrast
Comparison: None.

CLINICAL DATA: 81-year-old male with altered mental status,
confusion.

EXAM:
CT HEAD WITHOUT CONTRAST
TECHNIQUE: Contiguous axial images were obtained from the base of the skull
through the vertex without intravenous contrast.

[Series 3: ax head wo · axial · 0.33mm/px · z∈[-110,+24]mm · 8 of 33 slices shown, 10 images]
[im 3/33  brain]
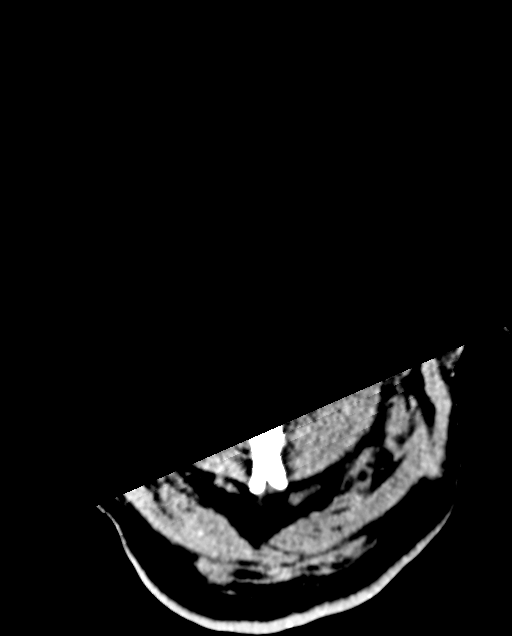
[im 3/33  bone]
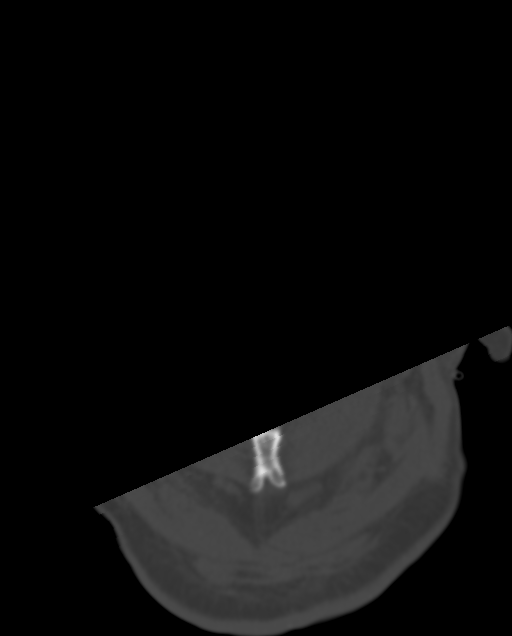
[im 7/33  brain]
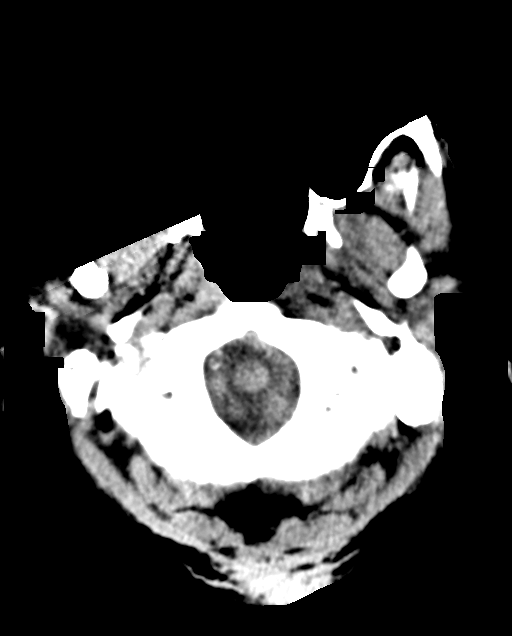
[im 10/33  brain]
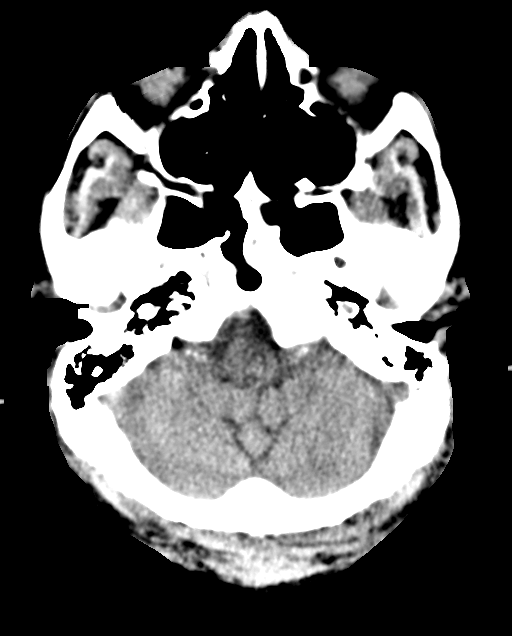
[im 15/33  brain]
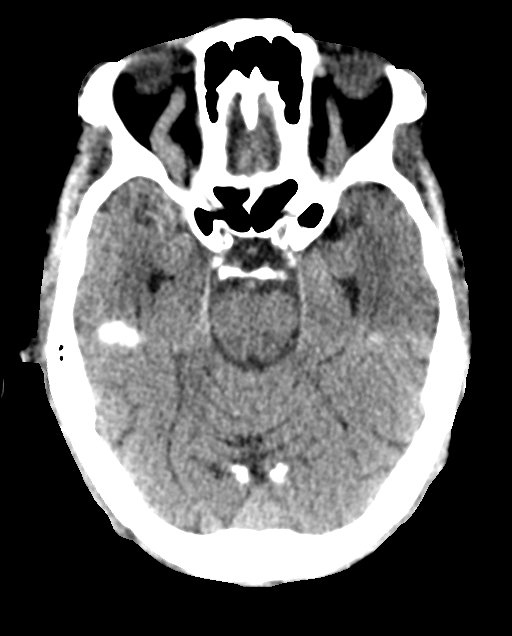
[im 18/33  brain]
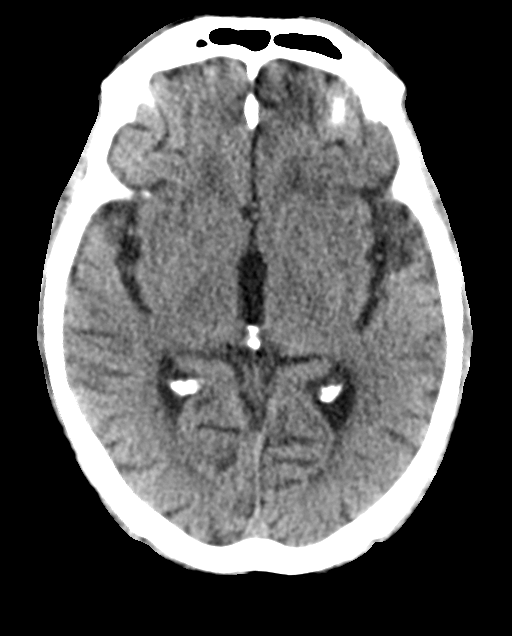
[im 18/33  bone]
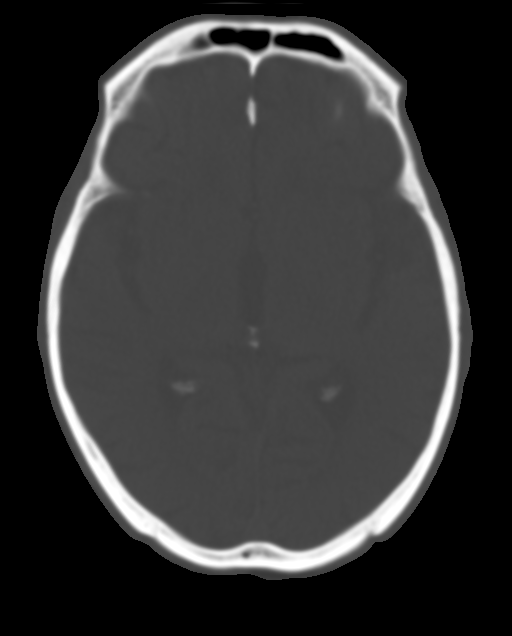
[im 23/33  brain]
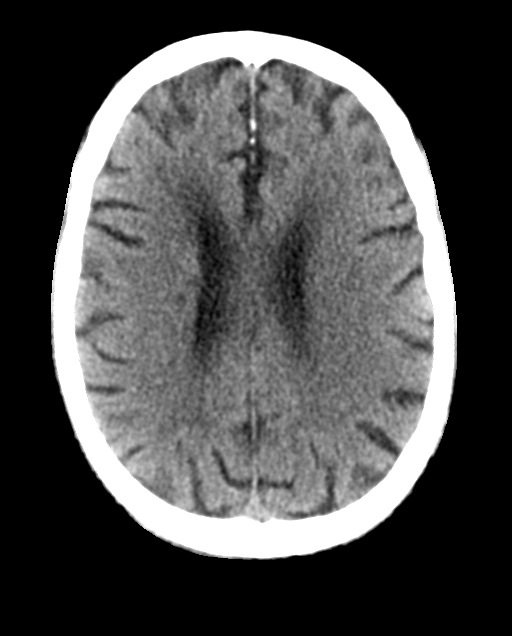
[im 26/33  brain]
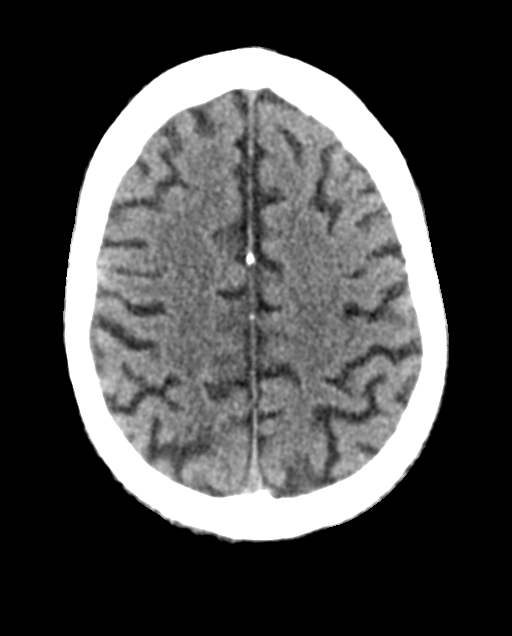
[im 30/33  brain]
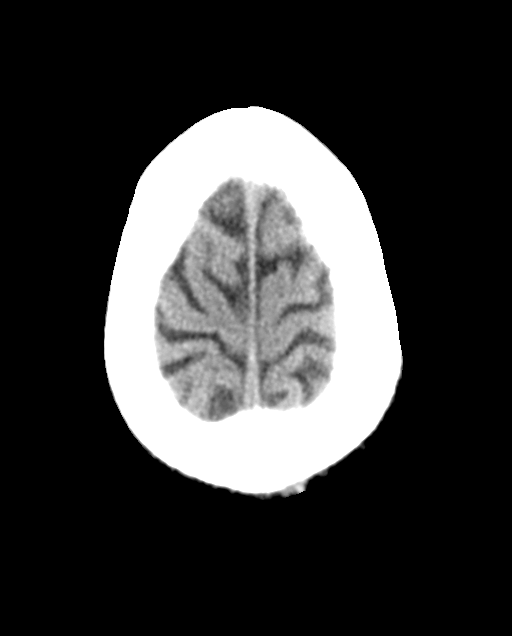

[Series 5: coronal soft tissue · coronal · 0.31mm/px · 3 of 70 slices shown]
[im 24/70  brain]
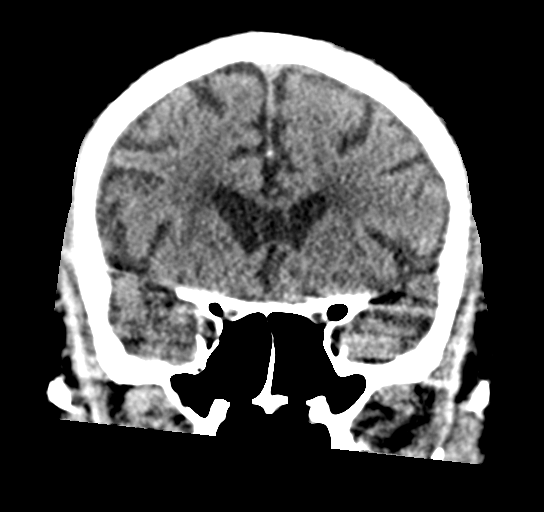
[im 31/70  brain]
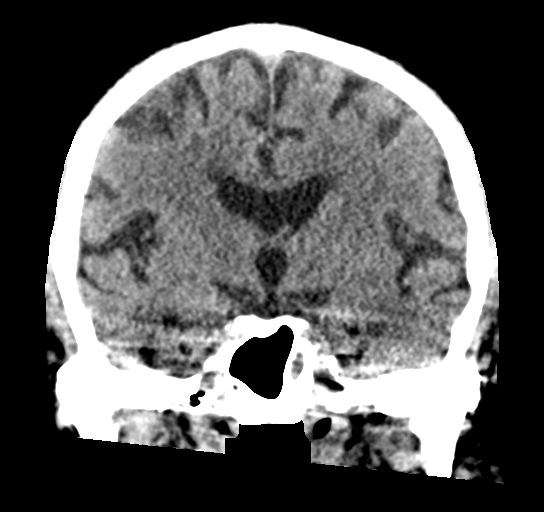
[im 39/70  brain]
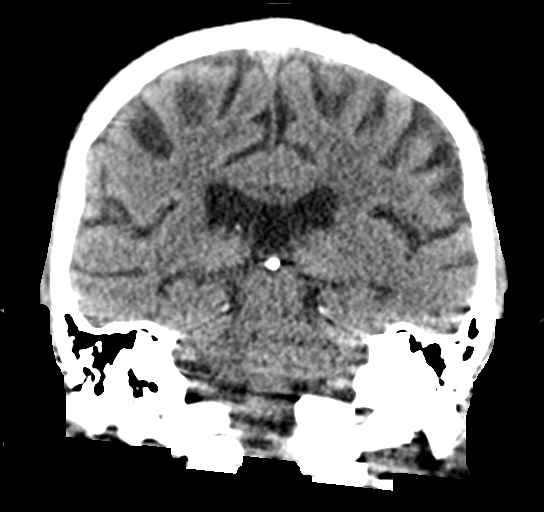

[Series 6: sagittal soft tissue · sagittal · 0.31mm/px · 3 of 54 slices shown]
[im 18/54  brain]
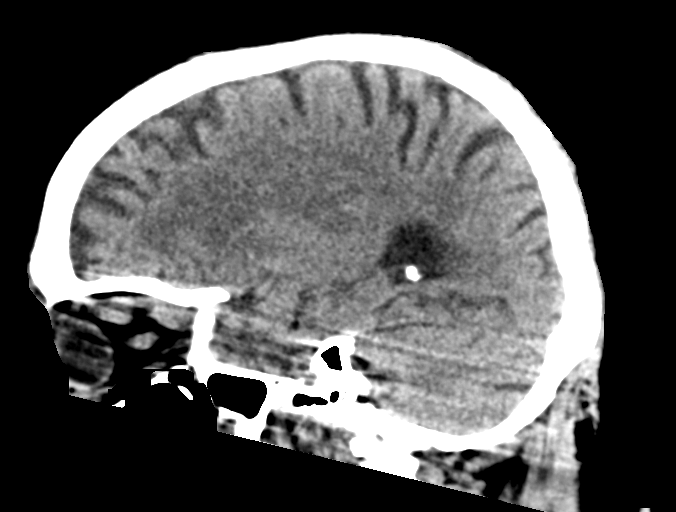
[im 27/54  brain]
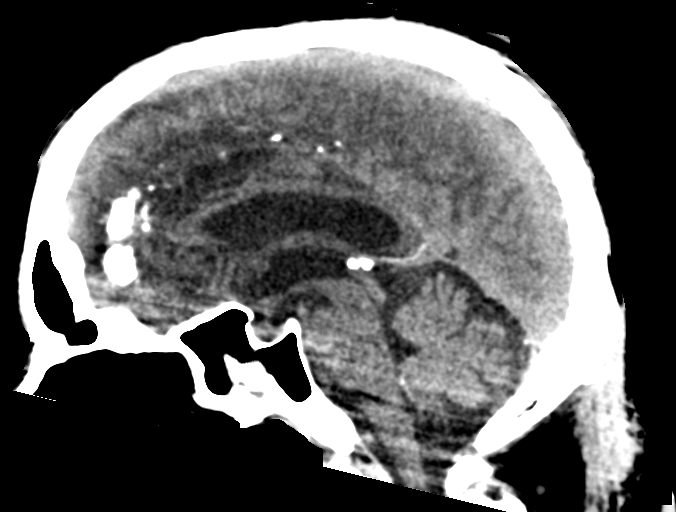
[im 36/54  brain]
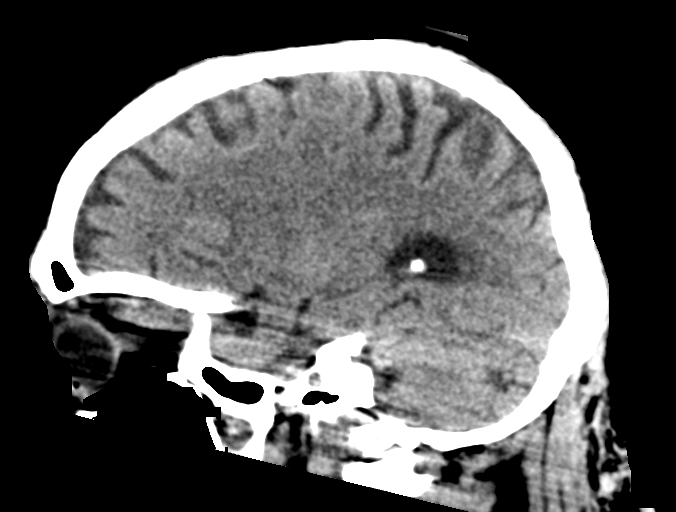

[14 of 47 positions shown; findings below may reference images not displayed]

FINDINGS: Brain: Cavum septum pellucidum, normal variant. Patchy hypodensity
in the basal ganglia, most apparent in the right caudate compatible
with a chronic lacunar infarct at that site. Small chronic appearing
infarct in the left cerebellum (series 3, image 12).

No midline shift, ventriculomegaly, mass effect, evidence of mass
lesion, intracranial hemorrhage or evidence of cortically based
acute infarction. No cerebral cortical encephalomalacia identified.

Vascular: Calcified atherosclerosis at the skull base.

Skull: Intact, negative.

Sinuses/Orbits: Mild mucosal thickening in the left sphenoid sinus.
Otherwise clear. Bilateral tympanic cavities and mastoids are clear.

Other: Orbits soft tissues appear within normal limits; there is
symmetric prominence of the bilateral superior ophthalmic veins.
Questionable left posterior convexity scalp skin thickening or mild
soft tissue injury (series 4, image 33). This resembles a small
scalp hematoma on coronal image 55.
IMPRESSION: 1. No definite acute intracranial abnormality. Small chronic
appearing infarcts in the left cerebellum and basal ganglia.
2. Mild posterior left vertex scalp injury/hematoma suspected
without underlying skull fracture. Consider recent trauma.

## 2019-09-04 IMAGING — CR DG CHEST 2V
2 series · 2 of 2 positions shown · non-contrast
Comparison: 11/15/2017

CLINICAL DATA: Pericardial effusion

EXAM:
CHEST - 2 VIEW

[chest lat]
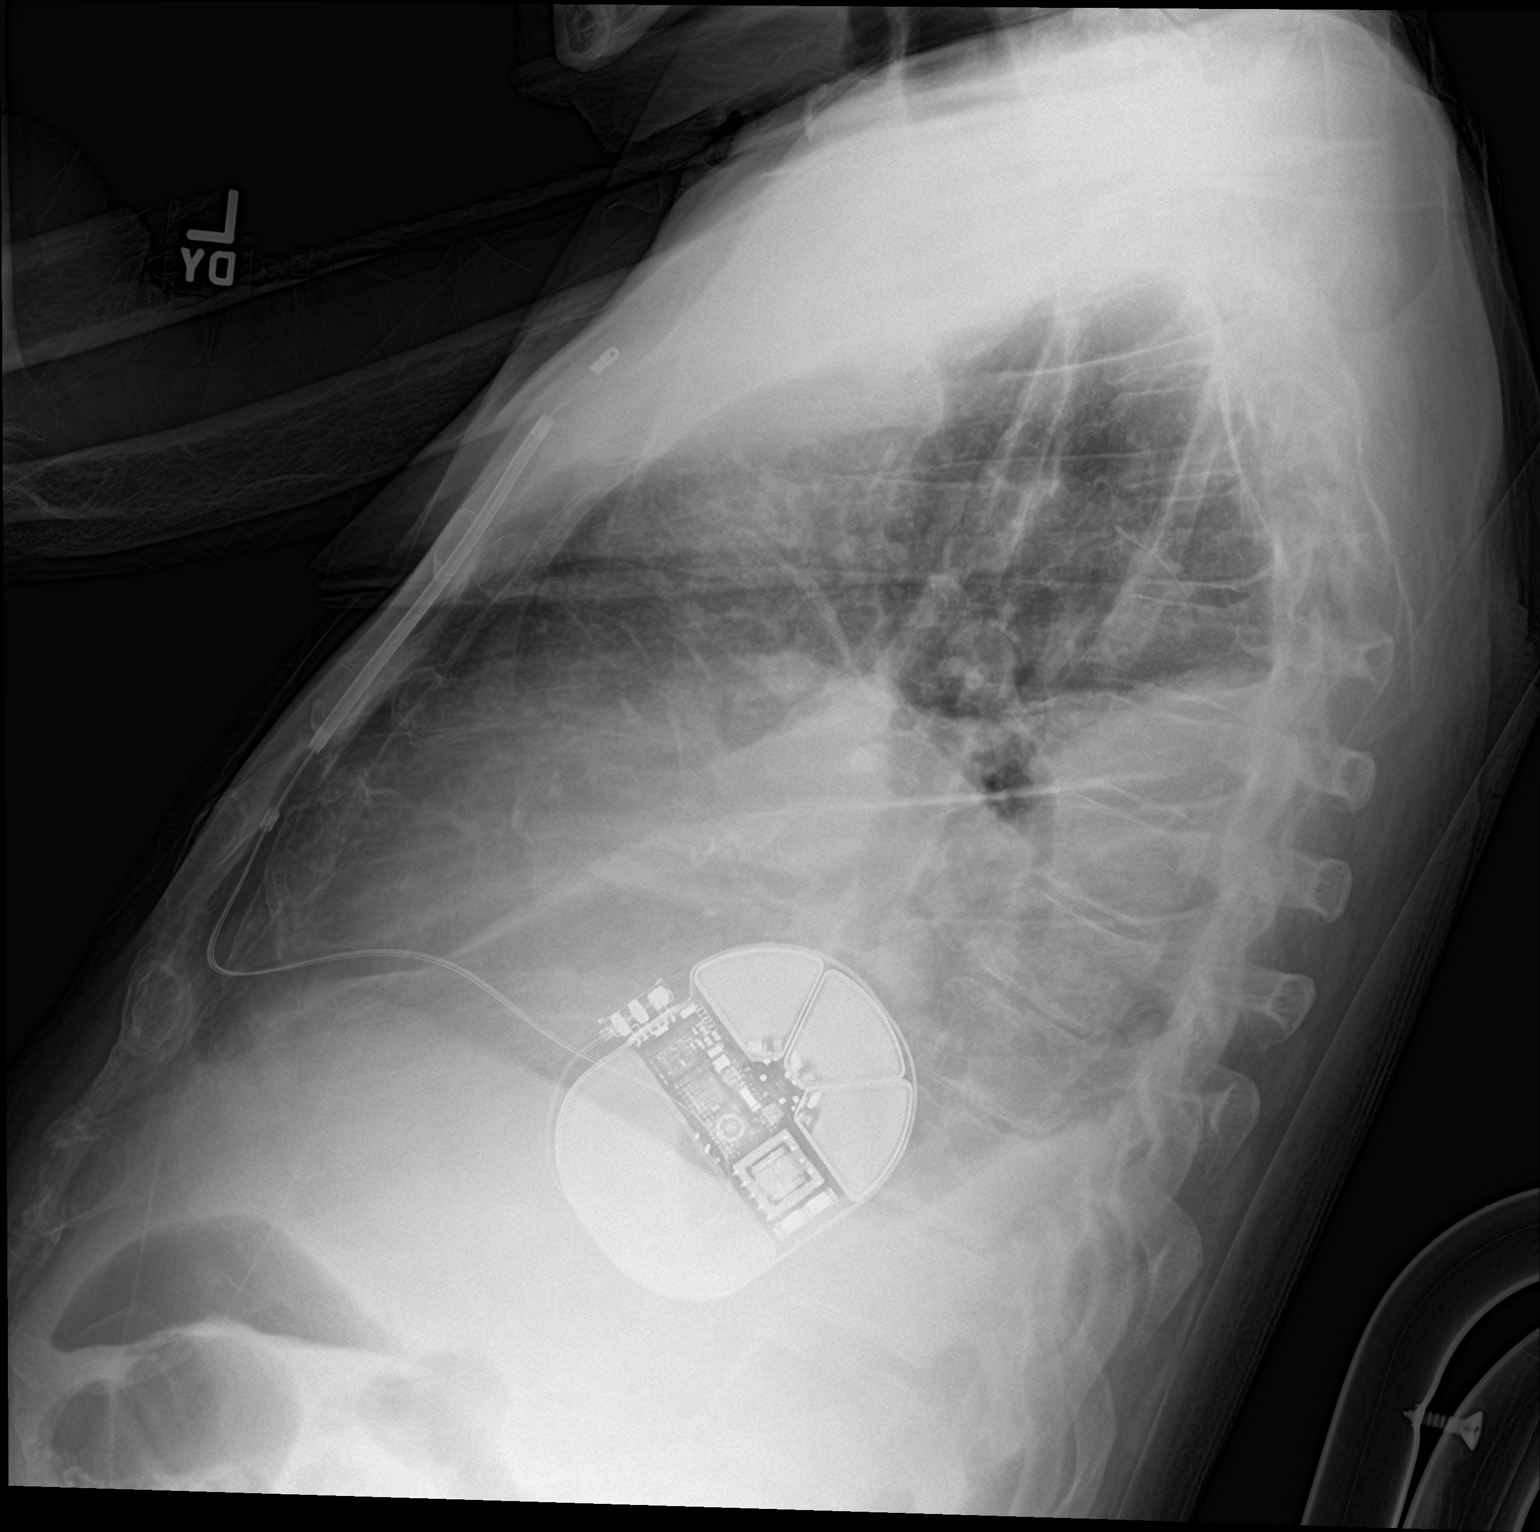

[chest ap]
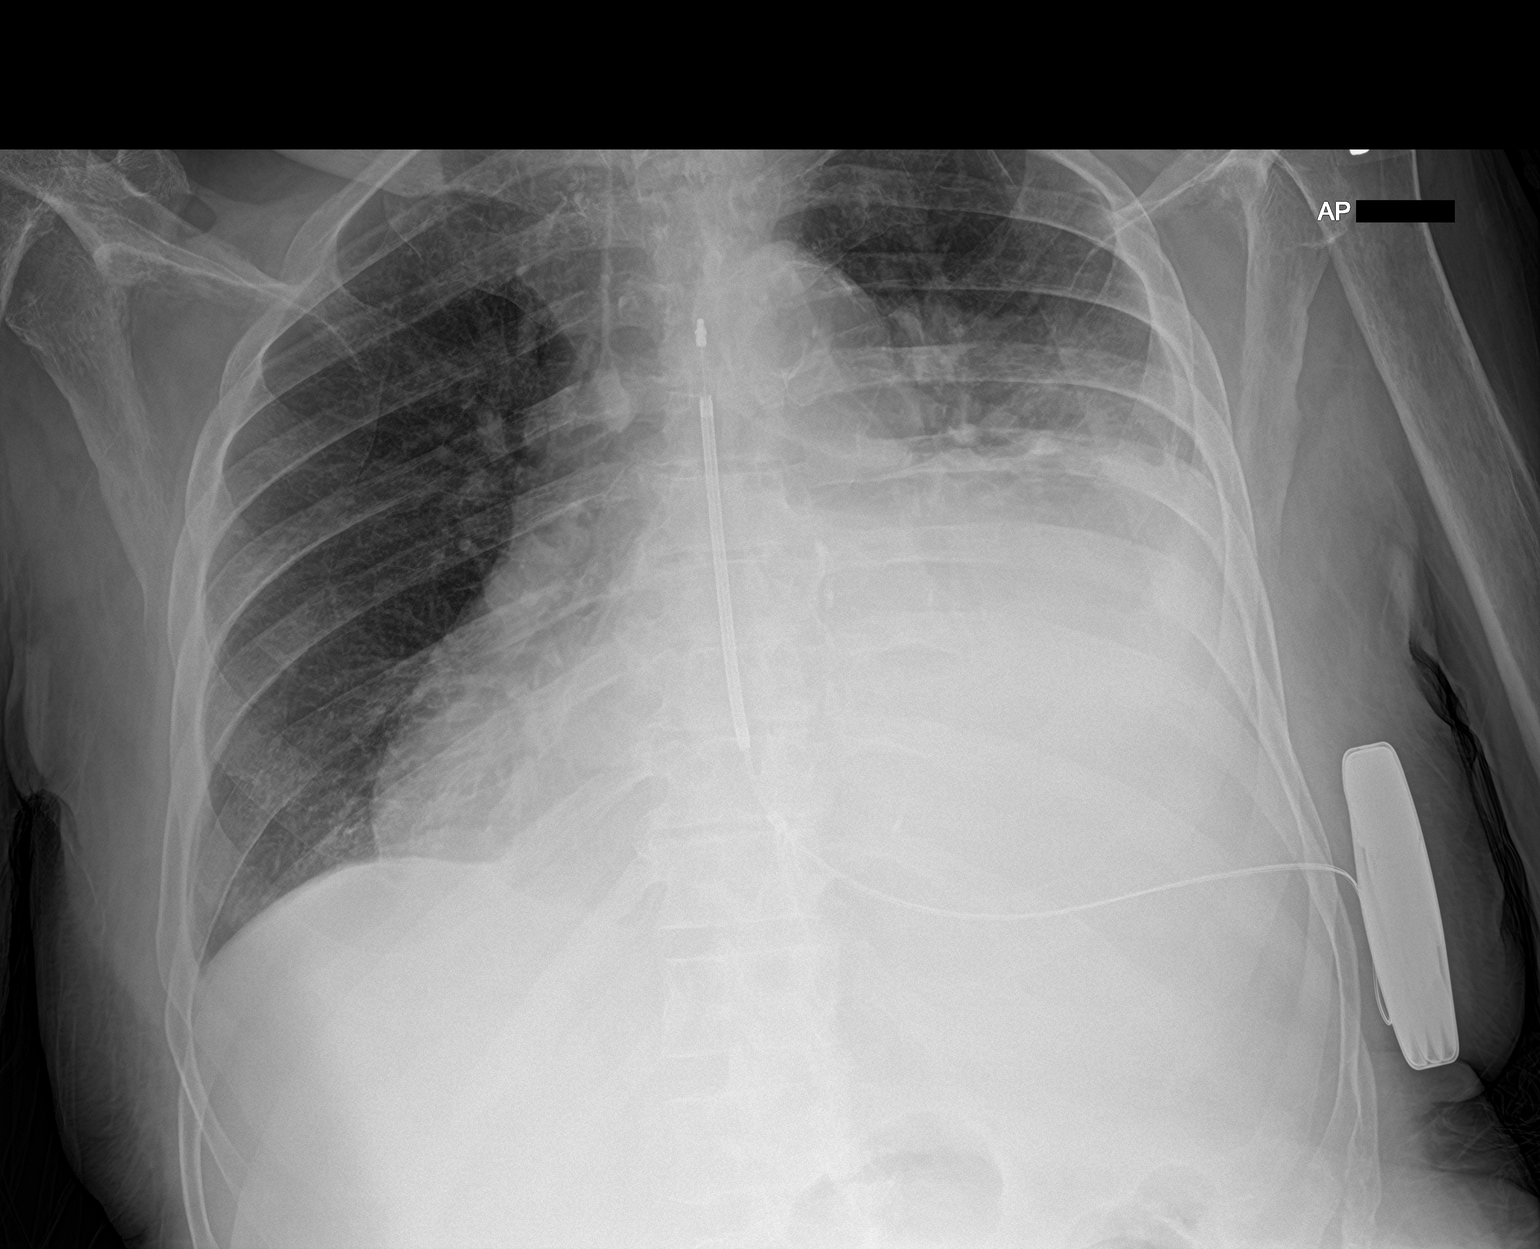

[2 of 2 positions shown; findings below may reference images not displayed]

FINDINGS: Marked cardiopericardial enlargement. There is left more than right
pleural effusion. External defibrillator lead in stable position.
Much of the left lower lung is obscured.
IMPRESSION: History of pericardial effusion with marked cardiopericardial
enlargement similar to 11/15/2017. Left more than right pleural
effusion with obscured/opacified left lower lung.

## 2020-09-27 IMAGING — DX PORTABLE CHEST - 1 VIEW
1 series · 1 of 1 positions shown · non-contrast
Comparison: Radiograph December 25, 2017.

CLINICAL DATA: Sepsis.

EXAM:
PORTABLE CHEST 1 VIEW

[chest ap]
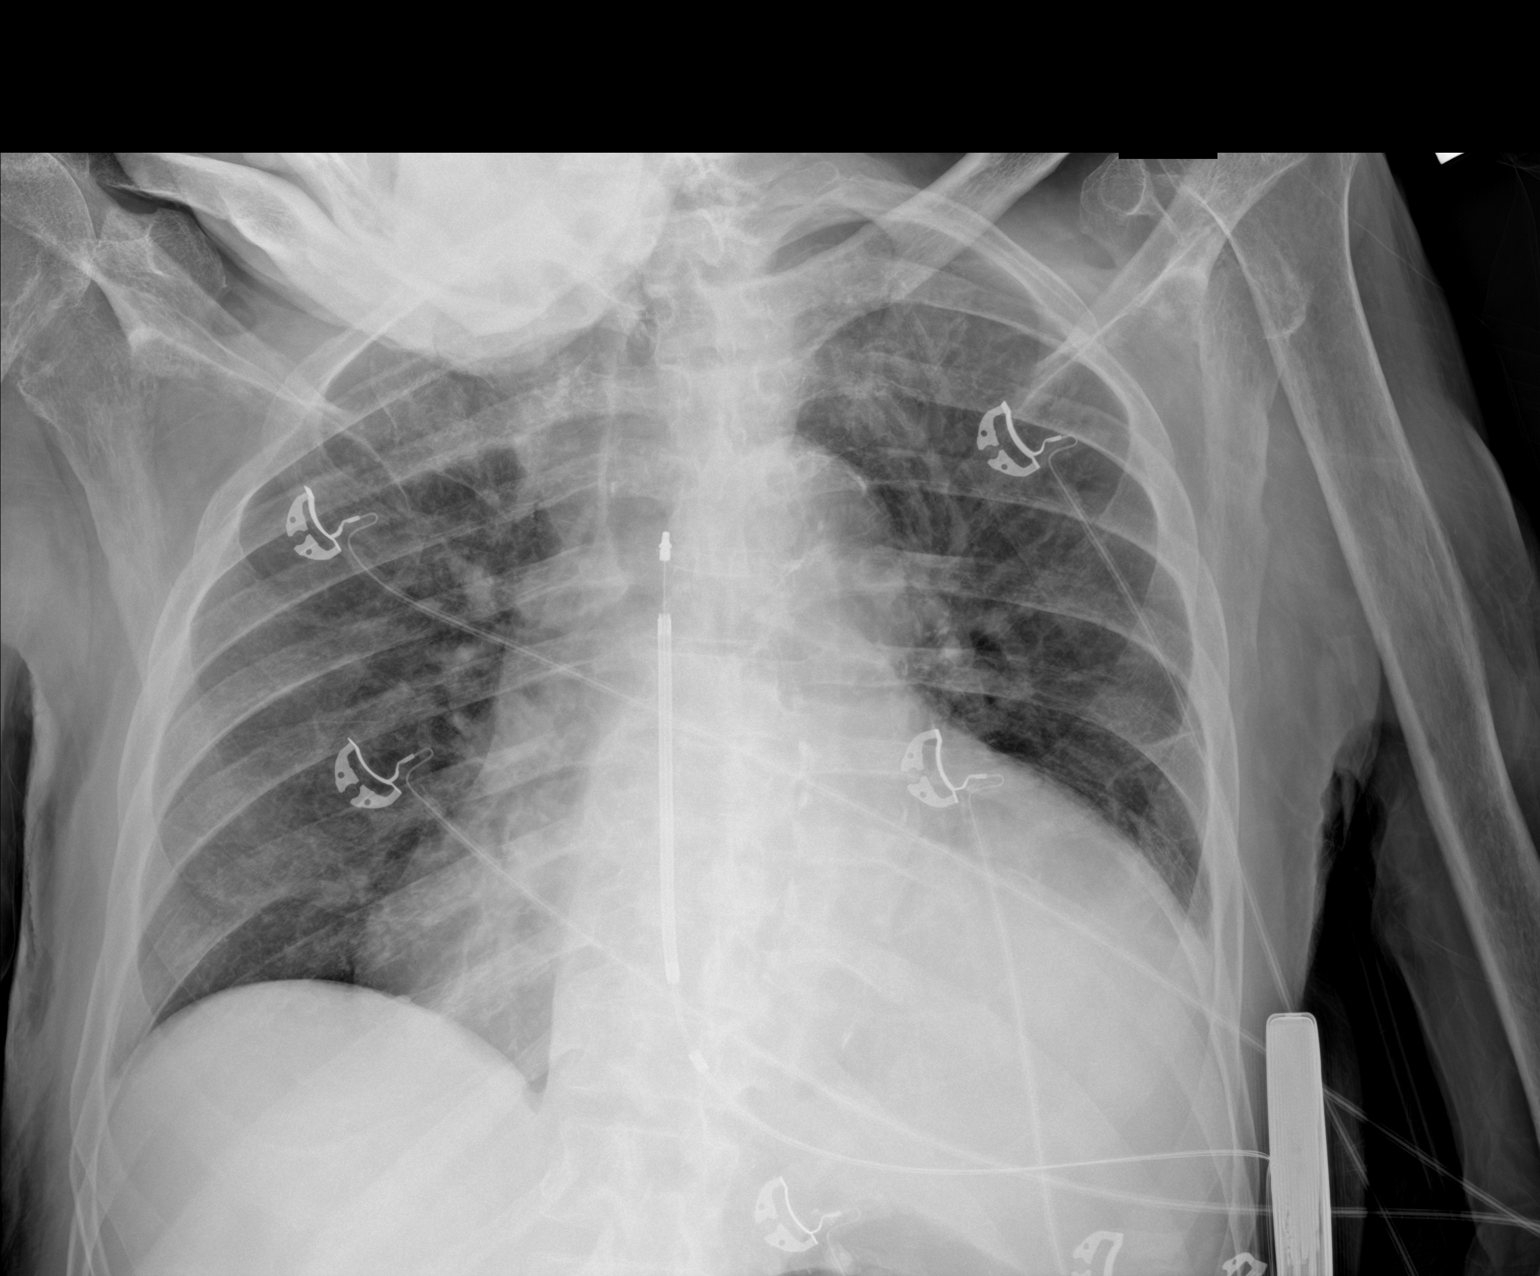

[1 of 1 positions shown; findings below may reference images not displayed]

FINDINGS: Stable cardiomegaly. Atherosclerosis of thoracic aorta is noted. No
pneumothorax is noted. Right lung is clear. Mild left basilar
atelectasis or infiltrate and possible associated effusion may be
present. Bony thorax unremarkable.
IMPRESSION: Mild left basilar opacity as described above.

Aortic Atherosclerosis ([id number]).
